# Patient Record
Sex: Male | Born: 1970
Health system: Southern US, Community
[De-identification: ages and names within clinical notes are randomized; demographics above are authoritative.]

## PROBLEM LIST (undated history)

## (undated) DIAGNOSIS — G44019 Episodic cluster headache, not intractable: Secondary | ICD-10-CM

## (undated) DIAGNOSIS — Z9981 Dependence on supplemental oxygen: Secondary | ICD-10-CM

## (undated) DIAGNOSIS — F17200 Nicotine dependence, unspecified, uncomplicated: Secondary | ICD-10-CM

## (undated) DIAGNOSIS — B019 Varicella without complication: Secondary | ICD-10-CM

## (undated) DIAGNOSIS — I499 Cardiac arrhythmia, unspecified: Secondary | ICD-10-CM

## (undated) DIAGNOSIS — T7840XA Allergy, unspecified, initial encounter: Secondary | ICD-10-CM

## (undated) DIAGNOSIS — M5410 Radiculopathy, site unspecified: Secondary | ICD-10-CM

## (undated) DIAGNOSIS — M1990 Unspecified osteoarthritis, unspecified site: Secondary | ICD-10-CM

## (undated) DIAGNOSIS — Z0000 Encounter for general adult medical examination without abnormal findings: Secondary | ICD-10-CM

## (undated) DIAGNOSIS — E663 Overweight: Secondary | ICD-10-CM

## (undated) DIAGNOSIS — Z87891 Personal history of nicotine dependence: Secondary | ICD-10-CM

## (undated) DIAGNOSIS — J339 Nasal polyp, unspecified: Secondary | ICD-10-CM

## (undated) DIAGNOSIS — E785 Hyperlipidemia, unspecified: Secondary | ICD-10-CM

## (undated) DIAGNOSIS — S4992XA Unspecified injury of left shoulder and upper arm, initial encounter: Secondary | ICD-10-CM

## (undated) DIAGNOSIS — R0902 Hypoxemia: Secondary | ICD-10-CM

## (undated) DIAGNOSIS — F419 Anxiety disorder, unspecified: Secondary | ICD-10-CM

## (undated) HISTORY — DX: Overweight: E66.3

## (undated) HISTORY — DX: Episodic cluster headache, not intractable: G44.019

## (undated) HISTORY — DX: Dependence on supplemental oxygen: Z99.81

## (undated) HISTORY — PX: APPENDECTOMY: SHX54

## (undated) HISTORY — DX: Unspecified injury of left shoulder and upper arm, initial encounter: S49.92XA

## (undated) HISTORY — DX: Hypoxemia: R09.02

## (undated) HISTORY — DX: Varicella without complication: B01.9

## (undated) HISTORY — PX: NASAL SINUS SURGERY: SHX719

## (undated) HISTORY — DX: Allergy, unspecified, initial encounter: T78.40XA

## (undated) HISTORY — DX: Nasal polyp, unspecified: J33.9

## (undated) HISTORY — DX: Encounter for general adult medical examination without abnormal findings: Z00.00

## (undated) HISTORY — DX: Hyperlipidemia, unspecified: E78.5

## (undated) HISTORY — DX: Nicotine dependence, unspecified, uncomplicated: F17.200

## (undated) HISTORY — DX: Personal history of nicotine dependence: Z87.891

## (undated) HISTORY — DX: Radiculopathy, site unspecified: M54.10

---

## 2010-02-18 ENCOUNTER — Emergency Department (HOSPITAL_BASED_OUTPATIENT_CLINIC_OR_DEPARTMENT_OTHER): Admission: EM | Admit: 2010-02-18 | Discharge: 2010-02-18 | Payer: Self-pay | Admitting: Emergency Medicine

## 2010-07-04 HISTORY — PX: BICEPS TENDON REPAIR: SHX566

## 2011-04-13 ENCOUNTER — Other Ambulatory Visit: Payer: Self-pay | Admitting: Family Medicine

## 2011-04-13 DIAGNOSIS — M25569 Pain in unspecified knee: Secondary | ICD-10-CM

## 2011-04-13 DIAGNOSIS — T1490XA Injury, unspecified, initial encounter: Secondary | ICD-10-CM

## 2011-04-15 ENCOUNTER — Ambulatory Visit
Admission: RE | Admit: 2011-04-15 | Discharge: 2011-04-15 | Disposition: A | Discharge status: Home / Self Care | Payer: BC Managed Care – PPO | Source: Ambulatory Visit | Attending: Family Medicine | Admitting: Family Medicine

## 2011-04-15 DIAGNOSIS — T1490XA Injury, unspecified, initial encounter: Secondary | ICD-10-CM

## 2011-04-15 DIAGNOSIS — M25569 Pain in unspecified knee: Secondary | ICD-10-CM

## 2011-04-29 ENCOUNTER — Ambulatory Visit (HOSPITAL_BASED_OUTPATIENT_CLINIC_OR_DEPARTMENT_OTHER)
Admission: RE | Admit: 2011-04-29 | Discharge: 2011-04-29 | Disposition: A | Discharge status: Home / Self Care | Payer: BC Managed Care – PPO | Source: Ambulatory Visit | Attending: Orthopedic Surgery | Admitting: Orthopedic Surgery

## 2011-04-29 DIAGNOSIS — M2350 Chronic instability of knee, unspecified knee: Secondary | ICD-10-CM | POA: Insufficient documentation

## 2011-04-29 DIAGNOSIS — M23305 Other meniscus derangements, unspecified medial meniscus, unspecified knee: Secondary | ICD-10-CM | POA: Insufficient documentation

## 2011-04-29 DIAGNOSIS — M23302 Other meniscus derangements, unspecified lateral meniscus, unspecified knee: Secondary | ICD-10-CM | POA: Insufficient documentation

## 2011-04-29 DIAGNOSIS — M2340 Loose body in knee, unspecified knee: Secondary | ICD-10-CM | POA: Insufficient documentation

## 2011-05-05 LAB — POCT HEMOGLOBIN-HEMACUE: Hemoglobin: 16.4 g/dL (ref 13.0–17.0)

## 2011-07-05 HISTORY — PX: CARPAL TUNNEL RELEASE: SHX101

## 2011-07-05 HISTORY — PX: KNEE SURGERY: SHX244

## 2011-07-29 ENCOUNTER — Ambulatory Visit (INDEPENDENT_AMBULATORY_CARE_PROVIDER_SITE_OTHER): Payer: BC Managed Care – PPO | Admitting: Family Medicine

## 2011-07-29 ENCOUNTER — Encounter: Payer: Self-pay | Admitting: Family Medicine

## 2011-07-29 VITALS — BP 118/87 | HR 66 | Temp 97.4°F | Ht 69.5 in | Wt 213.8 lb

## 2011-07-29 DIAGNOSIS — G44019 Episodic cluster headache, not intractable: Secondary | ICD-10-CM

## 2011-07-29 DIAGNOSIS — G44009 Cluster headache syndrome, unspecified, not intractable: Secondary | ICD-10-CM

## 2011-07-29 DIAGNOSIS — F17200 Nicotine dependence, unspecified, uncomplicated: Secondary | ICD-10-CM

## 2011-07-29 MED ORDER — KETOROLAC TROMETHAMINE 30 MG/ML IJ SOLN: 30.0000 mg | Freq: Once | INTRAMUSCULAR | Status: DC

## 2011-07-29 MED ORDER — KETOROLAC TROMETHAMINE 60 MG/2ML IM SOLN
30.0000 mg | Freq: Once | INTRAMUSCULAR | Status: AC
  Administered 2011-07-29: 30 mg via INTRAMUSCULAR

## 2011-07-29 MED ORDER — PROMETHAZINE HCL 25 MG PO TABS: 25.0000 mg | ORAL_TABLET | Freq: Four times a day (QID) | ORAL | Status: AC | PRN

## 2011-07-29 MED ORDER — BUTALBITAL-APAP-CAFFEINE 50-300-40 MG PO CAPS: 1.0000 | ORAL_CAPSULE | Freq: Three times a day (TID) | ORAL | Status: DC | PRN

## 2011-07-29 NOTE — Patient Instructions (Signed)
Migraine Headache A migraine headache is an intense, throbbing pain on one or both sides of your head. The exact cause of a migraine headache is not always known. A migraine may be caused when nerves in the brain become irritated and release chemicals that cause swelling within blood vessels, causing pain. Many migraine sufferers have a family history of migraines. Before you get a migraine you may or may not get an aura. An aura is a group of symptoms that can predict the beginning of a migraine. An aura may include:  Visual changes such as:   Flashing lights.   Bright spots or zig-zag lines.   Tunnel vision.   Feelings of numbness.   Trouble talking.   Muscle weakness.  SYMPTOMS  Pain on one or both sides of your head.   Pain that is pulsating or throbbing in nature.   Pain that is severe enough to prevent daily activities.   Pain that is aggravated by any daily physical activity.   Nausea (feeling sick to your stomach), vomiting, or both.   Pain with exposure to bright lights, loud noises, or activity.   General sensitivity to bright lights or loud noises.  MIGRAINE TRIGGERS Examples of triggers of migraine headaches include:   Alcohol.   Smoking.   Stress.   It may be related to menses (male menstruation).   Aged cheeses.   Foods or drinks that contain nitrates, glutamate, aspartame, or tyramine.   Lack of sleep.   Chocolate.   Caffeine.   Hunger.   Medications such as nitroglycerine (used to treat chest pain), birth control pills, estrogen, and some blood pressure medications.  DIAGNOSIS  A migraine headache is often diagnosed based on:  Symptoms.   Physical examination.   A computerized X-ray scan (computed tomography, CT) of your head.  TREATMENT  Medications can help prevent migraines if they are recurrent or should they become recurrent. Your caregiver can help you with a medication or treatment program that will be helpful to you.   Lying  down in a dark, quiet room may be helpful.   Keeping a headache diary may help you find a trend as to what may be triggering your headaches.  SEEK IMMEDIATE MEDICAL CARE IF:   You have confusion, personality changes or seizures.   You have headaches that wake you from sleep.   You have an increased frequency in your headaches.   You have a stiff neck.   You have a loss of vision.   You have muscle weakness.   You start losing your balance or have trouble walking.   You feel faint or pass out.  MAKE SURE YOU:   Understand these instructions.   Will watch your condition.   Will get help right away if you are not doing well or get worse.  Document Released: 06/20/2005 Document Revised: 03/02/2011 Document Reviewed: 02/03/2009 [facility] Patient Information 2012 [place], [place].  Start Zyrtec daily and saline nasal spray twice daily and increase Zyrtec to twice a day and report if HA persists

## 2011-07-31 ENCOUNTER — Encounter: Payer: Self-pay | Admitting: Family Medicine

## 2011-07-31 DIAGNOSIS — Z87891 Personal history of nicotine dependence: Secondary | ICD-10-CM | POA: Insufficient documentation

## 2011-07-31 DIAGNOSIS — E669 Obesity, unspecified: Secondary | ICD-10-CM | POA: Insufficient documentation

## 2011-07-31 DIAGNOSIS — F17200 Nicotine dependence, unspecified, uncomplicated: Secondary | ICD-10-CM

## 2011-07-31 DIAGNOSIS — G44019 Episodic cluster headache, not intractable: Secondary | ICD-10-CM | POA: Insufficient documentation

## 2011-07-31 DIAGNOSIS — E349 Endocrine disorder, unspecified: Secondary | ICD-10-CM | POA: Insufficient documentation

## 2011-07-31 HISTORY — DX: Nicotine dependence, unspecified, uncomplicated: F17.200

## 2011-07-31 HISTORY — DX: Personal history of nicotine dependence: Z87.891

## 2011-07-31 NOTE — Progress Notes (Signed)
Patient ID: Anthony Reese, male   DOB: 1971/05/11, 41 y.o.   MRN: [id number] Makar Slatter [id number] 1970-12-17 07/31/2011      Progress Note New Patient  Subjective  Chief Complaint  Chief Complaint  Patient presents with  . Establish Care    headache    HPI  For urgent nutrition appointment. He has been struggling with a severe headache resulting in firm mobile, photophobia, nausea and great discomfort since this morning. No trauma. Does have a history of similar headaches. Didn't come in batches of several and then resolve. Always located over the left thigh. Tried some Vicodin he had left over from her recent knee surgery which she feels it worse. He tried to Excedrin Migraine which did not help. Denies the recent illness, fevers, chills, sore throat or congestion. No chest pain, palpitations, shortness of breath  Past Medical History  Diagnosis Date  . Headache, cluster, episodic   . Low testosterone   . Overweight   . Tobacco use disorder 07/31/2011    Past Surgical History  Procedure Date  . Knee surgery     Family History  Problem Relation Age of Onset  . Cancer Mother     leukemia, pancreatic cancer  . Cancer Maternal Aunt     lung  . Cancer Maternal Grandfather     colon     History   Social History  . Marital Status: Married    Spouse Name: N/A    Number of Children: N/A  . Years of Education: N/A   Occupational History  . Not on file.   Social History Main Topics  . Smoking status: Current Some Day Smoker    Types: Cigarettes  . Smokeless tobacco: Never Used  . Alcohol Use: Yes  . Drug Use: No  . Sexually Active: Yes -- Male partner(s)   Other Topics Concern  . Not on file   Social History Narrative  . No narrative on file    No current outpatient prescriptions on file prior to visit.    Allergies  Allergen Reactions  . Penicillins     Review of Systems  Review of Systems  Constitutional: Negative for fever, chills and  malaise/fatigue.  HENT: Negative for hearing loss, nosebleeds and congestion.   Eyes: Positive for photophobia. Negative for discharge.  Respiratory: Negative for cough, sputum production, shortness of breath and wheezing.   Cardiovascular: Negative for chest pain, palpitations and leg swelling.  Gastrointestinal: Negative for heartburn, nausea, vomiting, abdominal pain, diarrhea, constipation and blood in stool.  Genitourinary: Negative for dysuria, urgency, frequency and hematuria.  Musculoskeletal: Negative for myalgias, back pain and falls.  Skin: Negative for rash.  Neurological: Positive for headaches. Negative for dizziness, tremors, sensory change, focal weakness, loss of consciousness and weakness.  Endo/Heme/Allergies: Negative for polydipsia. Does not bruise/bleed easily.  Psychiatric/Behavioral: Negative for depression and suicidal ideas. The patient is not nervous/anxious and does not have insomnia.     Objective  BP 118/87  Pulse 66  Temp(Src) 97.4 F (36.3 C) (Temporal)  Ht 5' 9.5" (1.765 m)  Wt 213 lb 12 oz (96.956 kg)  BMI 31.11 kg/m2  SpO2 98%  Physical Exam  Physical Exam  Constitutional: He is oriented to person, place, and time. He appears distressed.       WNWD W male in mild distress  HENT:  Head: Normocephalic and atraumatic.  Eyes: Conjunctivae are normal.  Neck: Neck supple. No thyromegaly present.  Cardiovascular: Normal rate, regular rhythm and normal heart sounds.  No murmur heard. Pulmonary/Chest: Effort normal and breath sounds normal. No respiratory distress.  Abdominal: He exhibits no distension and no mass. There is no tenderness.  Musculoskeletal: He exhibits no edema.  Neurological: He is alert and oriented to person, place, and time. He has normal reflexes. He displays normal reflexes. No cranial nerve deficit. Gait normal. Coordination normal. GCS score is 15.  Skin: Skin is warm.  Psychiatric: Memory, affect and judgment normal.        Assessment & Plan  Headache, cluster, episodic Always over the left eye and come in batches then clear for awhile, has photophobia and phonophobia with nausea at times. Given a shot of Toradol in the office and sent home to rest, hydrate and take a dose of Promethazine. Given an rx for Fioricet to try and return or seek care if no improvement.   Tobacco use disorder Only smoking 2 cigarettes per day but encouraged to attempt complete cessation.

## 2011-07-31 NOTE — Assessment & Plan Note (Signed)
Only smoking 2 cigarettes per day but encouraged to attempt complete cessation.

## 2011-07-31 NOTE — Assessment & Plan Note (Signed)
Always over the left eye and come in batches then clear for awhile, has photophobia and phonophobia with nausea at times. Given a shot of Toradol in the office and sent home to rest, hydrate and take a dose of Promethazine. Given an rx for Fioricet to try and return or seek care if no improvement.

## 2011-10-25 ENCOUNTER — Other Ambulatory Visit: Payer: Self-pay | Admitting: Family Medicine

## 2011-10-25 MED ORDER — FLUTICASONE PROPIONATE 50 MCG/ACT NA SUSP: 2.0000 | Freq: Every day | NASAL | Status: DC

## 2011-10-25 NOTE — Progress Notes (Signed)
Patient called with worsening congestion and allergies. Will send in Flonase to use 2 sprays each nostril daily. Can take his OTC nonsedating antihistamine up to twice during the day and may use Benadryl at bed as needed

## 2011-10-31 ENCOUNTER — Other Ambulatory Visit: Payer: Self-pay | Admitting: Family Medicine

## 2011-10-31 DIAGNOSIS — T7840XA Allergy, unspecified, initial encounter: Secondary | ICD-10-CM

## 2011-10-31 MED ORDER — METHYLPREDNISOLONE 4 MG PO KIT: PACK | ORAL | Status: AC

## 2011-11-14 ENCOUNTER — Ambulatory Visit (INDEPENDENT_AMBULATORY_CARE_PROVIDER_SITE_OTHER): Payer: BC Managed Care – PPO | Admitting: Family Medicine

## 2011-11-14 ENCOUNTER — Encounter: Payer: Self-pay | Admitting: Family Medicine

## 2011-11-14 VITALS — BP 117/77 | HR 68 | Temp 97.8°F | Wt 212.5 lb

## 2011-11-14 DIAGNOSIS — G5601 Carpal tunnel syndrome, right upper limb: Secondary | ICD-10-CM

## 2011-11-14 DIAGNOSIS — M79601 Pain in right arm: Secondary | ICD-10-CM

## 2011-11-14 DIAGNOSIS — M79609 Pain in unspecified limb: Secondary | ICD-10-CM

## 2011-11-14 DIAGNOSIS — M5412 Radiculopathy, cervical region: Secondary | ICD-10-CM

## 2011-11-14 DIAGNOSIS — G5600 Carpal tunnel syndrome, unspecified upper limb: Secondary | ICD-10-CM

## 2011-11-14 DIAGNOSIS — M5410 Radiculopathy, site unspecified: Secondary | ICD-10-CM

## 2011-11-14 MED ORDER — METHYLPREDNISOLONE 4 MG PO KIT: PACK | ORAL | Status: DC

## 2011-11-14 NOTE — Patient Instructions (Signed)
Ulnar Nerve Contusion with Rehab The ulnar nerve lies near the surface of the skin as it passes by the elbow. This location causes it to be susceptible to injury. An ulnar nerve contusion is a bruise of the nerve. It is the result of direct trauma to the elbow. Ulnar nerve contusions are characterized by pain, weakness, and loss of feeling in the hand. SYMPTOMS   Signs of nerve damage include: tingling, numbness, weakness, and/or loss of feeling in the hand, specifically the little finger and ring finger.   Sharp pains that may shoot from the elbow to the wrist and hand.   Decreased hand function.   Tenderness and/ or inflammation in the elbow.   Muscle wasting (atrophy) in the hand.  CAUSES  Ulnar nerve contusions are caused by direct trauma to the elbow that results in bleeding which enters the nerve. RISK INCREASES WITH:  Contact sports (football, soccer, or rugby).   Bleeding disorders.   Taking blood thinning medicine (warfarin [Coumadin], aspirin, or nonsteroidal anti-inflammatory medications).   Diabetes mellitus.   Underactive thyroid gland (hypothyroidism).  PREVENTION  Wear properly fitted and padded protective equipment.   Only take blood thinning medication when necessary.  PROGNOSIS  Ulnar nerve contusions usually heal within 6 weeks. Healing often occurs spontaneously, but treatment helps reduce symptoms.  RELATED COMPLICATIONS   Permanent nerve damage, including pain, numbness, tingling, or weakness in the hand (rare).   Weak grip.   Prolonged healing time, if improperly treated or re-injured.  TREATMENT  Treatment initially involves resting from any activities that aggravate the symptoms, and the use of ice and medications to help reduce pain and inflammation. The use of strengthening and stretching exercises may help reduce pain with activity. These exercises may be performed at home or with referral to a therapist. Your caregiver may recommend that you  splint the elbow at night to help healing of the nerve. If symptoms persist despite conservative (non-surgical) treatment, then surgery may be recommended to free the nerve. MEDICATION   If pain medication is necessary, then nonsteroidal anti-inflammatory medications, such as aspirin and ibuprofen, or other minor pain relievers, such as acetaminophen, are often recommended.   Do not take pain medication within 7 days before surgery.   Prescription pain relievers may be given if deemed necessary by your caregiver. Use only as directed and only as much as you need.  COLD THERAPY  Cold treatment (icing) relieves pain and reduces inflammation. Cold treatment should be applied for 10 to 15 minutes every 2 to 3 hours for inflammation and pain and immediately after any activity that aggravates your symptoms. Use ice packs or massage the area with a piece of ice (ice massage). SEEK MEDICAL CARE IF:   Treatment seems to offer no benefit, or the condition worsens.   Any medications produce adverse side effects.  EXERCISES RANGE OF MOTION (ROM) AND STRETCHING EXERCISES - Ulnar Nerve Contusion These exercises may help you when beginning to rehabilitate your injury. Do not begin these exercises until your physician, physical therapist or athletic trainer advises you to do so. Discontinue any exercise that worsens your symptoms. Contact your physician with instructions on how to continue. Your symptoms may resolve with or without further involvement from your physician, physical therapist or athletic trainer. While completing these exercises, remember:  Restoring tissue flexibility helps normal motion to return to the joints. This allows healthier, less painful movement and activity.   An effective stretch should be held for at least 30 seconds.  A stretch should never be painful. You should only feel a gentle lengthening or release in the stretched tissue.  RANGE OF MOTION - Extension  Hold your right  / left arm at your side and straighten your elbow as far as you can using your right / left arm muscles.   Straighten the right / left elbow farther by gently pushing down on your forearm until you feel a gentle stretch on the inside of your elbow. Hold this position for __________ seconds.   Slowly return to the starting position.  Repeat __________ times. Complete this exercise __________ times per day.  RANGE OF MOTION - Flexion  Hold your right / left arm at your side and bend your elbow as far as you can using your right / left arm muscles.   Bend the right / left elbow farther by gently pushing up on your forearm until you feel a gentle stretch on the outside of your elbow. Hold this position for __________ seconds.   Slowly return to the starting position.  Repeat __________ times. Complete this exercise __________ times per day.  RANGE OF MOTION - Wrist Flexion, Active-Assisted  Extend your right / left elbow with your fingers pointing down.*   Gently pull the back of your hand towards you until you feel a gentle stretch on the top of your forearm.   Hold this position for __________ seconds.  Repeat __________ times. Complete this exercise __________ times per day.  *If directed by your physician, physical therapist or athletic trainer, complete this stretch with your elbow bent rather than extended. RANGE OF MOTION - Wrist Extension, Active-Assisted  Extend your right / left elbow and turn your palm upwards.*   Gently pull your palm/fingertips back so your wrist extends and your fingers point more toward the ground.   You should feel a gentle stretch on the inside of your forearm.   Hold this position for __________ seconds.  Repeat __________ times. Complete this exercise __________ times per day. *If directed by your physician, physical therapist or athletic trainer, complete this stretch with your elbow bent, rather than extended. RANGE OF MOTION - Supination,  Active  Stand or sit with your elbows at your side. Bend your right / left elbow to 90 degrees.   Turn your palm upward until you feel a gentle stretch on the inside of your forearm.   Hold this position for __________ seconds. Slowly release and return to the starting position.  Repeat __________ times. Complete this stretch __________ times per day.  RANGE OF MOTION - Pronation, Active  Stand or sit with your elbows at your side. Bend your right / left elbow to 90 degrees.   Turn your palm downward until you feel a gentle stretch on the top of your forearm.   Hold this position for __________ seconds. Slowly release and return to the starting position.  Repeat __________ times. Complete this stretch __________ times per day.  STRETCH - Wrist Flexion   Place the back of your right / left hand on a tabletop leaving your elbow slightly bent. Your fingers should point away from your body.   Gently press the back of your hand down onto the table by straightening your elbow. You should feel a stretch on the top of your forearm.   Hold this position for __________ seconds.  Repeat __________ times. Complete this stretch __________ times per day.  STRETCH - Wrist Extension   Place your right / left fingertips on a  tabletop leaving your elbow slightly bent. Your fingers should point backwards.   Gently press your fingers and palm down onto the table by straightening your elbow. You should feel a stretch on the inside of your forearm.   Hold this position for __________ seconds.  Repeat __________ times. Complete this stretch __________ times per day.  STRENGTHENING EXERCISES - Ulnar Nerve Contusion These exercises may help you when beginning to rehabilitate your injury. Do not begin these exercises until your physician, physical therapist or athletic trainer advises you to do so. Discontinue any exercise that worsens your symptoms. Contact your physician for instructions on how to continue.  They may resolve your symptoms with or without further involvement from your physician, physical therapist or athletic trainer. While completing these exercises, remember:   Muscles can gain both the endurance and the strength needed for everyday activities through controlled exercises.   Complete these exercises as instructed by your physician, physical therapist or athletic trainer. Progress with the resistance and repetition exercises only as your caregiver advises.  STRENGTH - Wrist Flexors  Sit with your right / left forearm palm-up and fully supported. Your elbow should be resting below the height of your shoulder. Allow your wrist to extend over the edge of the surface.   Loosely holding a __________ weight or a piece of rubber exercise band/tubing, slowly curl your hand up toward your forearm.   Hold this position for __________ seconds. Slowly lower the wrist back to the starting position in a controlled manner.  Repeat __________ times. Complete this exercise __________ times per day.  STRENGTH - Wrist Extensors  Sit with your right / left forearm palm-down and fully supported. Your elbow should be resting below the height of your shoulder. Allow your wrist to extend over the edge of the surface.   Loosely holding a __________ weight or a piece of rubber exercise band/tubing, slowly curl your hand up toward your forearm.   Hold this position for __________ seconds. Slowly lower the wrist back to the starting position in a controlled manner.  Repeat __________ times. Complete this exercise __________ times per day.  STRENGTH - Ulnar Deviators  Stand with a ____________________ weight in your right / left hand or sit holding on to the rubber exercise band/tubing with your opposite arm supported.   Move your wrist so that your pinkie travels toward your forearm and your thumb moves away from your forearm.   Hold this position for __________ seconds and then slowly lower the wrist  back to the starting position.  Repeat __________ times. Complete this exercise __________ times per day STRENGTH - Radial Deviators  Stand with a ____________________ weight in your right / left hand or sit holding on to the rubber exercise band/tubing with your arm supported.   Raise your hand upward in front of you or pull up on the rubber tubing.   Hold this position for __________ seconds and then slowly lower the wrist back to the starting position.  Repeat __________ times. Complete this exercise __________ times per day. STRENGTH - Grip  Grasp a tennis ball, a dense sponge, or a large, rolled sock in your hand.   Squeeze as hard as you can without increasing any pain.   Hold this position for __________ seconds. Release your grip slowly.  Repeat __________ times. Complete this exercise __________ times per day.  Document Released: 06/20/2005 Document Revised: 06/09/2011 Document Reviewed: 10/02/2008 [facility] Patient Information 2012 [place], [place].Carpal Tunnel Syndrome You may have carpal tunnel  syndrome. This is a common condition. Carpal tunnel syndrome occurs when the tendons, bones, or ligaments in the wrist press against the median nerve as it passes into the hand.  Symptoms can include:  Intermittent numbness.   Pain or a tingling sensation in thumb and first two fingers.  The pain may radiate up to the shoulder. There may even be weakness in the hand muscles. The pain is often worse at night and in the early morning. Nerve conduction tests may be used to prove the diagnosis. Carpal tunnel syndrome is most often due to repeated movements of the hand or wrist. Other causes can include:  Prior injuries.   Diabetes.   Obesity.   Smoking.   Pregnancy. Symptoms that develop during pregnancy often stop when the pregnancy is over.  Treatment includes:  Splinting - A wrist splint helps prevent movements that irritate the nerve. Splints are especially helpful at night  when the symptoms are often worse.   Ice packs - Cold packs applied to the palm side of the wrist for 20 minutes every 2 hours while awake may give some relief.   Medication - Medicine to reduce inflammation and pain are often used. Cortisone injections around the nerve may also bring improvement.  Severe cases of carpal tunnel syndrome can require surgery to relieve the pressure on the nerve. This may be necessary if there is evidence of weakness or decreased sensation in your hand, or if your symptoms do not improve with conservative treatment. See your caregiver for follow-up to be certain your condition is improving. Document Released: 07/28/2004 Document Revised: 03/02/2011 Document Reviewed: 04/26/2007 [facility] Patient Information 2012 [place], [place].

## 2011-11-20 ENCOUNTER — Encounter: Payer: Self-pay | Admitting: Family Medicine

## 2011-11-20 DIAGNOSIS — M5410 Radiculopathy, site unspecified: Secondary | ICD-10-CM | POA: Insufficient documentation

## 2011-11-20 HISTORY — DX: Radiculopathy, site unspecified: M54.10

## 2011-11-20 NOTE — Progress Notes (Signed)
Patient ID: Anthony Reese, male   DOB: 03-27-1971, 41 y.o.   MRN: [id number] Anthony Reese [id number] 1970/09/21 11/20/2011      Progress Note-Follow Up  Subjective  Chief Complaint  No chief complaint on file.   HPI  Patient is a 41 year old male who is in today complaining of worsening pain and disability in his hands. He has had trouble with intermittent numbness tingling and discomfort in his arms in the past but this has become more persistent. Right arm is worst on left. There is pain radiating from his right elbow down to all of his fingertips and even up his arm slightly. He describes a numbness and tingling with increased use. He also some numbness and tingling in his fingertips in the left hand as well but these are less intense. He's also noted some numbness and discomfort at the back of his right heel for about the last month and denies any injury or other recent complaints. Does note when he was recently on a course of steroids his discomfort did improve temporarily. No complaints of neck pain or recent injury. No cp/palp/sob  Past Medical History  Diagnosis Date  . Headache, cluster, episodic   . Low testosterone   . Overweight   . Tobacco use disorder 07/31/2011  . Radiculopathy affecting upper extremity 11/20/2011    Past Surgical History  Procedure Date  . Knee surgery     Family History  Problem Relation Age of Onset  . Cancer Mother     leukemia, pancreatic cancer  . Cancer Maternal Aunt     lung  . Cancer Maternal Grandfather     colon     History   Social History  . Marital Status: Married    Spouse Name: N/A    Number of Children: N/A  . Years of Education: N/A   Occupational History  . Not on file.   Social History Main Topics  . Smoking status: Current Some Day Smoker    Types: Cigarettes  . Smokeless tobacco: Never Used  . Alcohol Use: Yes  . Drug Use: No  . Sexually Active: Yes -- Male partner(s)   Other Topics Concern  . Not on file     Social History Narrative  . No narrative on file    Current Outpatient Prescriptions on File Prior to Visit  Medication Sig Dispense Refill  . Butalbital-APAP-Caffeine (ORBIVAN) 50-300-40 MG CAPS Take 1 capsule by mouth 3 (three) times daily as needed.  60 capsule  1  . fluticasone (FLONASE) 50 MCG/ACT nasal spray Place 2 sprays into the nose daily. As needed for allergies  16 g  6  . TESTOSTERONE CYPIONATE IM Inject into the muscle.        Allergies  Allergen Reactions  . Penicillins     Review of Systems  Review of Systems  Constitutional: Negative for fever and malaise/fatigue.  HENT: Negative for congestion.   Eyes: Negative for discharge.  Respiratory: Negative for shortness of breath.   Cardiovascular: Negative for chest pain, palpitations and leg swelling.  Gastrointestinal: Negative for nausea, abdominal pain and diarrhea.  Genitourinary: Negative for dysuria.  Musculoskeletal: Positive for joint pain. Negative for falls.  Skin: Negative for rash.  Neurological: Positive for tingling and sensory change. Negative for loss of consciousness and headaches.  Endo/Heme/Allergies: Negative for polydipsia.  Psychiatric/Behavioral: Negative for depression and suicidal ideas. The patient is not nervous/anxious and does not have insomnia.     Objective  BP 117/77  Pulse 68  Temp 97.8 F (36.6 C)  Wt 212 lb 8 oz (96.389 kg)  SpO2 96%  Physical Exam  Physical Exam  Constitutional: He is oriented to person, place, and time and well-developed, well-nourished, and in no distress. No distress.  HENT:  Head: Normocephalic and atraumatic.  Eyes: Conjunctivae are normal.  Neck: Neck supple. No thyromegaly present.  Cardiovascular: Normal rate, regular rhythm and normal heart sounds.   No murmur heard. Pulmonary/Chest: Effort normal and breath sounds normal. No respiratory distress.  Abdominal: He exhibits no distension and no mass. There is no tenderness.   Musculoskeletal: He exhibits no edema.  Neurological: He is alert and oriented to person, place, and time. He displays normal reflexes. No cranial nerve deficit. He exhibits normal muscle tone. Gait normal.       Pain radiates down right arm from elbow with palpation of medial epicondyle. Positive Tinnel's b/l.  Skin: Skin is warm.  Psychiatric: Memory, affect and judgment normal.    No results found for this basename: TSH   Lab Results  Component Value Date   HGB 16.4 04/29/2011      Assessment & Plan  Radiculopathy affecting upper extremity Symptoms worse in right arm but present to some degree in both. Pain in right arm is localized around elbow and numbness, tingling and discomfort radiate down to right hand and in to all 5 fingers. Some discomfort radiates up toward upper arm as well but this is much lesser degree. Numbness and tingling in left hand fingers also noted but less constant or intense. Is referred to ortho for further work up of CTS and possible Ulnar nerve involvement. Given a short course of steroids to use for next five days and he should apply Aspercreme and ice bid as well. Encouraged to immobilize wrist qhs

## 2011-11-20 NOTE — Assessment & Plan Note (Signed)
Symptoms worse in right arm but present to some degree in both. Pain in right arm is localized around elbow and numbness, tingling and discomfort radiate down to right hand and in to all 5 fingers. Some discomfort radiates up toward upper arm as well but this is much lesser degree. Numbness and tingling in left hand fingers also noted but less constant or intense. Is referred to ortho for further work up of CTS and possible Ulnar nerve involvement. Given a short course of steroids to use for next five days and he should apply Aspercreme and ice bid as well. Encouraged to immobilize wrist qhs

## 2012-02-06 ENCOUNTER — Other Ambulatory Visit: Payer: Self-pay | Admitting: Family Medicine

## 2012-02-06 DIAGNOSIS — Z0000 Encounter for general adult medical examination without abnormal findings: Secondary | ICD-10-CM

## 2012-02-06 DIAGNOSIS — E349 Endocrine disorder, unspecified: Secondary | ICD-10-CM

## 2012-02-06 DIAGNOSIS — E785 Hyperlipidemia, unspecified: Secondary | ICD-10-CM

## 2012-04-13 ENCOUNTER — Other Ambulatory Visit (INDEPENDENT_AMBULATORY_CARE_PROVIDER_SITE_OTHER): Payer: BC Managed Care – PPO

## 2012-04-13 DIAGNOSIS — E349 Endocrine disorder, unspecified: Secondary | ICD-10-CM

## 2012-04-13 DIAGNOSIS — Z0000 Encounter for general adult medical examination without abnormal findings: Secondary | ICD-10-CM

## 2012-04-13 DIAGNOSIS — E785 Hyperlipidemia, unspecified: Secondary | ICD-10-CM

## 2012-04-13 DIAGNOSIS — E291 Testicular hypofunction: Secondary | ICD-10-CM

## 2012-04-13 LAB — CBC
RDW: 13 % (ref 11.5–14.6)
WBC: 4.6 10*3/uL (ref 4.5–10.5)

## 2012-04-13 LAB — RENAL FUNCTION PANEL
Creatinine, Ser: 0.9 mg/dL (ref 0.4–1.5)
GFR: 101.58 mL/min (ref >60.00)
Glucose, Bld: 93 mg/dL (ref 70–99)
Phosphorus: 2.7 mg/dL (ref 2.3–4.6)
Sodium: 142 mEq/L (ref 135–145)

## 2012-04-13 LAB — LIPID PANEL
HDL: 54.7 mg/dL (ref >39.00)
Total CHOL/HDL Ratio: 5
Triglycerides: 103 mg/dL (ref 0.0–149.0)

## 2012-04-13 LAB — HEPATIC FUNCTION PANEL
Albumin: 4.3 g/dL (ref 3.5–5.2)
Alkaline Phosphatase: 43 U/L (ref 39–117)
Bilirubin, Direct: 0 mg/dL (ref 0.0–0.3)

## 2012-04-13 LAB — TSH: TSH: 1.16 u[IU]/mL (ref 0.35–5.50)

## 2012-04-16 ENCOUNTER — Other Ambulatory Visit: Payer: Self-pay | Admitting: Family Medicine

## 2012-04-16 DIAGNOSIS — E785 Hyperlipidemia, unspecified: Secondary | ICD-10-CM

## 2012-04-16 DIAGNOSIS — R7989 Other specified abnormal findings of blood chemistry: Secondary | ICD-10-CM

## 2012-04-16 MED ORDER — TESTOSTERONE CYPIONATE 200 MG/ML IM SOLN: 200.0000 mg | INTRAMUSCULAR | Status: DC

## 2012-04-16 MED ORDER — KRILL OIL PO CAPS: ORAL_CAPSULE | ORAL | Status: DC

## 2012-06-19 ENCOUNTER — Telehealth: Payer: Self-pay | Admitting: *Deleted

## 2012-06-19 NOTE — Telephone Encounter (Signed)
Pt called and requested a copy of his last labs.  These were printed and given to wife.

## 2012-07-26 ENCOUNTER — Other Ambulatory Visit: Payer: Self-pay | Admitting: Family Medicine

## 2012-07-26 DIAGNOSIS — M545 Low back pain: Secondary | ICD-10-CM

## 2012-07-26 DIAGNOSIS — G5601 Carpal tunnel syndrome, right upper limb: Secondary | ICD-10-CM

## 2012-07-26 DIAGNOSIS — M79601 Pain in right arm: Secondary | ICD-10-CM

## 2012-07-26 MED ORDER — CYCLOBENZAPRINE HCL 10 MG PO TABS: 10.0000 mg | ORAL_TABLET | Freq: Two times a day (BID) | ORAL | Status: DC | PRN

## 2012-07-26 MED ORDER — METHYLPREDNISOLONE 4 MG PO KIT: PACK | ORAL | Status: AC

## 2012-08-29 ENCOUNTER — Telehealth: Payer: Self-pay | Admitting: Family Medicine

## 2012-08-29 ENCOUNTER — Other Ambulatory Visit (INDEPENDENT_AMBULATORY_CARE_PROVIDER_SITE_OTHER): Payer: BC Managed Care – PPO

## 2012-08-29 DIAGNOSIS — R300 Dysuria: Secondary | ICD-10-CM

## 2012-08-29 LAB — POCT URINALYSIS DIPSTICK
Bilirubin, UA: NEGATIVE
Glucose, UA: NEGATIVE
Ketones, UA: NEGATIVE
pH, UA: 7

## 2012-08-29 MED ORDER — CIPROFLOXACIN HCL 500 MG PO TABS: 500.0000 mg | ORAL_TABLET | Freq: Two times a day (BID) | ORAL | Status: DC

## 2012-08-29 NOTE — Telephone Encounter (Signed)
Given paper copy of Ciprofloxacin to use as needed if symptoms worsen or urine culture positive

## 2012-08-29 NOTE — Progress Notes (Unsigned)
Labs only

## 2012-08-31 LAB — URINE CULTURE: Organism ID, Bacteria: NO GROWTH

## 2012-10-22 ENCOUNTER — Telehealth: Payer: Self-pay | Admitting: Family Medicine

## 2012-10-22 DIAGNOSIS — R7989 Other specified abnormal findings of blood chemistry: Secondary | ICD-10-CM

## 2012-10-22 MED ORDER — TESTOSTERONE CYPIONATE 200 MG/ML IM SOLN: 200.0000 mg | INTRAMUSCULAR | Status: DC

## 2012-10-22 NOTE — Telephone Encounter (Signed)
OK to refill Testosterone with same sig and disp #1 bottle, no further refills

## 2012-10-22 NOTE — Telephone Encounter (Signed)
Faxed refill to pharmacy

## 2012-10-22 NOTE — Telephone Encounter (Signed)
Refill testosterone cup 200 mg  Days supply 30

## 2012-10-29 ENCOUNTER — Other Ambulatory Visit: Payer: Self-pay

## 2012-10-29 MED ORDER — FLUTICASONE PROPIONATE 50 MCG/ACT NA SUSP
2.0000 | Freq: Every day | NASAL | Status: DC
Start: 2012-10-29 — End: 2013-11-18

## 2012-10-29 NOTE — Telephone Encounter (Signed)
Sent refill on Flonase to pharmacy

## 2012-12-17 ENCOUNTER — Telehealth: Payer: Self-pay | Admitting: Family Medicine

## 2012-12-17 NOTE — Telephone Encounter (Signed)
Patient had swelling around eyes and felt SOB after second dose of Ativan. Will list as allergy

## 2012-12-19 ENCOUNTER — Other Ambulatory Visit: Payer: Self-pay | Admitting: Family Medicine

## 2012-12-19 ENCOUNTER — Telehealth: Payer: Self-pay | Admitting: *Deleted

## 2012-12-19 DIAGNOSIS — E349 Endocrine disorder, unspecified: Secondary | ICD-10-CM

## 2012-12-19 NOTE — Telephone Encounter (Signed)
Needing order for testosterone...Raechel Chute

## 2012-12-20 LAB — TESTOSTERONE: Testosterone: 231 ng/dL — ABNORMAL LOW (ref 300–890)

## 2013-01-30 ENCOUNTER — Encounter: Payer: Self-pay | Admitting: Family Medicine

## 2013-01-30 ENCOUNTER — Ambulatory Visit (HOSPITAL_BASED_OUTPATIENT_CLINIC_OR_DEPARTMENT_OTHER)
Admission: RE | Admit: 2013-01-30 | Discharge: 2013-01-30 | Disposition: A | Discharge status: Home / Self Care | Payer: BC Managed Care – PPO | Source: Ambulatory Visit | Attending: Family Medicine | Admitting: Family Medicine

## 2013-01-30 ENCOUNTER — Ambulatory Visit (INDEPENDENT_AMBULATORY_CARE_PROVIDER_SITE_OTHER): Payer: BC Managed Care – PPO | Admitting: Family Medicine

## 2013-01-30 VITALS — BP 122/78 | HR 63 | Temp 98.4°F | Ht 69.0 in | Wt 212.2 lb

## 2013-01-30 DIAGNOSIS — M25512 Pain in left shoulder: Secondary | ICD-10-CM

## 2013-01-30 DIAGNOSIS — M25519 Pain in unspecified shoulder: Secondary | ICD-10-CM | POA: Insufficient documentation

## 2013-01-30 DIAGNOSIS — S4980XA Other specified injuries of shoulder and upper arm, unspecified arm, initial encounter: Secondary | ICD-10-CM

## 2013-01-30 DIAGNOSIS — S4992XA Unspecified injury of left shoulder and upper arm, initial encounter: Secondary | ICD-10-CM | POA: Insufficient documentation

## 2013-01-30 HISTORY — DX: Unspecified injury of left shoulder and upper arm, initial encounter: S49.92XA

## 2013-01-30 MED ORDER — METHYLPREDNISOLONE (PAK) 4 MG PO TABS: ORAL_TABLET | ORAL | Status: DC

## 2013-01-30 MED ORDER — METHOCARBAMOL 500 MG PO TABS: 500.0000 mg | ORAL_TABLET | Freq: Three times a day (TID) | ORAL | Status: DC | PRN

## 2013-01-30 NOTE — Patient Instructions (Addendum)
Ice for next 24 hours then apply moist heat as needed after that Can try topical Aspercreme or Salon Pas cream or patches Can restart Advil/Motrin/Ibuprofen 200mg  2 tabs po every 6 hours as needed with food when steroids are ending   Shoulder Pain The shoulder is the joint that connects your arms to your body. The bones that form the shoulder joint include the upper arm bone (humerus), the shoulder blade (scapula), and the collarbone (clavicle). The top of the humerus is shaped like a ball and fits into a rather flat socket on the scapula (glenoid cavity). A combination of muscles and strong, fibrous tissues that connect muscles to bones (tendons) support your shoulder joint and hold the ball in the socket. Small, fluid-filled sacs (bursae) are located in different areas of the joint. They act as cushions between the bones and the overlying soft tissues and help reduce friction between the gliding tendons and the bone as you move your arm. Your shoulder joint allows a wide range of motion in your arm. This range of motion allows you to do things like scratch your back or throw a ball. However, this range of motion also makes your shoulder more prone to pain from overuse and injury. Causes of shoulder pain can originate from both injury and overuse and usually can be grouped in the following four categories:  Redness, swelling, and pain (inflammation) of the tendon (tendinitis) or the bursae (bursitis).  Instability, such as a dislocation of the joint.  Inflammation of the joint (arthritis).  Broken bone (fracture). HOME CARE INSTRUCTIONS   Apply ice to the sore area.  Put ice in a plastic bag.  Place a towel between your skin and the bag.  Leave the ice on for 15-20 minutes, 3-4 times per day for the first 2 days.  Stop using cold packs if they do not help with the pain.  If you have a shoulder sling or immobilizer, wear it as long as your caregiver instructs. Only remove it to shower or  bathe. Move your arm as little as possible, but keep your hand moving to prevent swelling.  Squeeze a soft ball or foam pad as much as possible to help prevent swelling.  Only take over-the-counter or prescription medicines for pain, discomfort, or fever as directed by your caregiver. SEEK MEDICAL CARE IF:   Your shoulder pain increases, or new pain develops in your arm, hand, or fingers.  Your hand or fingers become cold and numb.  Your pain is not relieved with medicines. SEEK IMMEDIATE MEDICAL CARE IF:   Your arm, hand, or fingers are numb or tingling.  Your arm, hand, or fingers are significantly swollen or turn white or blue. MAKE SURE YOU:   Understand these instructions.  Will watch your condition.  Will get help right away if you are not doing well or get worse. Document Released: 03/30/2005 Document Revised: 03/14/2012 Document Reviewed: 06/04/2011 [facility] Patient Information 2014 [place], [place].
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
## 2013-01-31 ENCOUNTER — Ambulatory Visit: Payer: BC Managed Care – PPO | Admitting: Family Medicine

## 2013-04-26 ENCOUNTER — Telehealth: Payer: Self-pay | Admitting: *Deleted

## 2013-04-26 DIAGNOSIS — R7989 Other specified abnormal findings of blood chemistry: Secondary | ICD-10-CM

## 2013-04-26 MED ORDER — TESTOSTERONE CYPIONATE 200 MG/ML IM SOLN: 200.0000 mg | INTRAMUSCULAR | Status: DC

## 2013-04-26 NOTE — Telephone Encounter (Signed)
Rx printed and faxed to pharmacy. 

## 2013-04-26 NOTE — Telephone Encounter (Signed)
OK to refill testosterone with same sig, #1 bottle, no refills til seen

## 2013-04-26 NOTE — Telephone Encounter (Signed)
Pt called requesting refill of testosterone. Pt is due for CPE on 05/04/13 but will not be able to complete it by then due to insurance reasons. Current refills are expiring.  Please advise.

## 2013-09-02 NOTE — Addendum Note (Signed)
Addended by: Kelle Darting A on: 09/02/2013 10:21 AM   Modules accepted: Orders

## 2013-09-02 NOTE — Telephone Encounter (Signed)
Pt will return to the lab this week. Order released.

## 2013-09-04 LAB — TESTOSTERONE: TESTOSTERONE: 1311 ng/dL — AB (ref 300–890)

## 2013-09-05 NOTE — Telephone Encounter (Signed)
Pt informed

## 2013-09-10 ENCOUNTER — Telehealth: Payer: Self-pay | Admitting: *Deleted

## 2013-09-10 DIAGNOSIS — E349 Endocrine disorder, unspecified: Secondary | ICD-10-CM

## 2013-09-10 MED ORDER — TESTOSTERONE CYPIONATE 200 MG/ML IM SOLN: 100.0000 mg | INTRAMUSCULAR | Status: DC

## 2013-09-10 NOTE — Telephone Encounter (Signed)
Pt requesting refill of testosterone.  Refill called to pharmacy voicemail per directions below and lab order entered.  Notes Recorded by Mosie Lukes, MD on 09/04/2013 at 2:21 PM So let him know that his testosterone is now he needs to drop his dose to 1/2 ml every other week. Recheck in 3 months

## 2013-09-10 NOTE — Telephone Encounter (Signed)
Received fax from [facility] that testosterone is requiring prior authorization. Prior auth completed via cover my meds and uploaded electronically. Should receive determination notice within 3 days.

## 2013-09-11 NOTE — Telephone Encounter (Signed)
Received fax from [facility] requesting #2 be completed of PA form and copy of testosterone level be faxed to them.  Question 2 completed and labs faxed to [phone]. Awaiting approval / denial status.

## 2013-09-13 NOTE — Telephone Encounter (Signed)
Received notice from [facility] that Androgel had been approved.  Androgel was marked as drug being prescribed which was an error.  I meant to note that pt had tried and failed androgel in the past. Spoke with Anise at [facility] and she requests that we resubmit form with corrections. Corrections made and form refaxed to below #.  Awaiting approval / denial status.

## 2013-09-17 NOTE — Telephone Encounter (Signed)
Received notice from [facility] that PA was denied stating a baseline serum or free testosterone level that was below the normal range was not present.  I faxed the testosterone results from the last year along with the PA form which showed that pt's level was low 1 year ago. Spoke with [name] at [facility] [phone]) and was advised that they did receive pt's test results and they have been forwarded to the [facility] Provider review team and we should have a decision with 3 days.

## 2013-09-18 NOTE — Telephone Encounter (Signed)
Patient informed, understood/SLS 

## 2013-09-18 NOTE — Telephone Encounter (Signed)
Received approval from [facility] from 09/10/13 through 07/03/2038. Notified Buyer, retail at [employer]. Left message on pt's cell# and to call if any questions.

## 2013-11-18 ENCOUNTER — Encounter: Payer: Self-pay | Admitting: Family Medicine

## 2013-11-18 ENCOUNTER — Ambulatory Visit (INDEPENDENT_AMBULATORY_CARE_PROVIDER_SITE_OTHER): Payer: BC Managed Care – PPO | Admitting: Family Medicine

## 2013-11-18 ENCOUNTER — Telehealth: Payer: Self-pay | Admitting: *Deleted

## 2013-11-18 VITALS — BP 115/80 | HR 85 | Temp 98.1°F | Ht 69.0 in | Wt 191.1 lb

## 2013-11-18 DIAGNOSIS — E663 Overweight: Secondary | ICD-10-CM

## 2013-11-18 DIAGNOSIS — J339 Nasal polyp, unspecified: Secondary | ICD-10-CM | POA: Insufficient documentation

## 2013-11-18 DIAGNOSIS — Z0000 Encounter for general adult medical examination without abnormal findings: Secondary | ICD-10-CM

## 2013-11-18 DIAGNOSIS — E349 Endocrine disorder, unspecified: Secondary | ICD-10-CM

## 2013-11-18 DIAGNOSIS — Z1211 Encounter for screening for malignant neoplasm of colon: Secondary | ICD-10-CM | POA: Insufficient documentation

## 2013-11-18 DIAGNOSIS — E785 Hyperlipidemia, unspecified: Secondary | ICD-10-CM | POA: Insufficient documentation

## 2013-11-18 DIAGNOSIS — T7840XA Allergy, unspecified, initial encounter: Secondary | ICD-10-CM | POA: Insufficient documentation

## 2013-11-18 DIAGNOSIS — E291 Testicular hypofunction: Secondary | ICD-10-CM

## 2013-11-18 DIAGNOSIS — L259 Unspecified contact dermatitis, unspecified cause: Secondary | ICD-10-CM

## 2013-11-18 HISTORY — DX: Hyperlipidemia, unspecified: E78.5

## 2013-11-18 HISTORY — DX: Encounter for general adult medical examination without abnormal findings: Z00.00

## 2013-11-18 HISTORY — DX: Nasal polyp, unspecified: J33.9

## 2013-11-18 LAB — LIPID PANEL
Cholesterol: 207 mg/dL — ABNORMAL HIGH (ref 0–200)
HDL: 59 mg/dL (ref >39)
LDL CALC: 128 mg/dL — AB (ref 0–99)
TRIGLYCERIDES: 98 mg/dL (ref <150)
Total CHOL/HDL Ratio: 3.5 Ratio
VLDL: 20 mg/dL (ref 0–40)

## 2013-11-18 LAB — CBC
HEMATOCRIT: 45.9 % (ref 39.0–52.0)
Hemoglobin: 16.3 g/dL (ref 13.0–17.0)
MCH: 32.4 pg (ref 26.0–34.0)
MCHC: 35.5 g/dL (ref 30.0–36.0)
MCV: 91.3 fL (ref 78.0–100.0)
Platelets: 267 10*3/uL (ref 150–400)
RBC: 5.03 MIL/uL (ref 4.22–5.81)
RDW: 13.1 % (ref 11.5–15.5)
WBC: 5.6 10*3/uL (ref 4.0–10.5)

## 2013-11-18 LAB — HEPATIC FUNCTION PANEL
ALBUMIN: 4.6 g/dL (ref 3.5–5.2)
ALT: 24 U/L (ref 0–53)
AST: 24 U/L (ref 0–37)
Alkaline Phosphatase: 57 U/L (ref 39–117)
BILIRUBIN INDIRECT: 0.7 mg/dL (ref 0.2–1.2)
Bilirubin, Direct: 0.2 mg/dL (ref 0.0–0.3)
TOTAL PROTEIN: 7 g/dL (ref 6.0–8.3)
Total Bilirubin: 0.9 mg/dL (ref 0.2–1.2)

## 2013-11-18 LAB — RENAL FUNCTION PANEL
Albumin: 4.6 g/dL (ref 3.5–5.2)
BUN: 19 mg/dL (ref 6–23)
CALCIUM: 9.5 mg/dL (ref 8.4–10.5)
CO2: 26 mEq/L (ref 19–32)
CREATININE: 0.8 mg/dL (ref 0.50–1.35)
Chloride: 104 mEq/L (ref 96–112)
Glucose, Bld: 90 mg/dL (ref 70–99)
PHOSPHORUS: 3.4 mg/dL (ref 2.3–4.6)
Potassium: 4.5 mEq/L (ref 3.5–5.3)
Sodium: 138 mEq/L (ref 135–145)

## 2013-11-18 LAB — TSH: TSH: 1.024 u[IU]/mL (ref 0.350–4.500)

## 2013-11-18 MED ORDER — METHYLPREDNISOLONE (PAK) 4 MG PO TABS: ORAL_TABLET | ORAL | Status: DC

## 2013-11-18 MED ORDER — FLUTICASONE PROPIONATE 50 MCG/ACT NA SUSP
2.0000 | Freq: Every day | NASAL | Status: DC
Start: 2013-11-18 — End: 2014-06-12

## 2013-11-18 MED ORDER — MONTELUKAST SODIUM 10 MG PO TABS: 10.0000 mg | ORAL_TABLET | Freq: Every evening | ORAL | Status: DC | PRN

## 2013-11-18 MED ORDER — METHYLPREDNISOLONE ACETATE 40 MG/ML IJ SUSP: 40.0000 mg | Freq: Once | INTRAMUSCULAR | Status: DC

## 2013-11-18 MED ORDER — CETIRIZINE HCL 10 MG PO TABS: 10.0000 mg | ORAL_TABLET | Freq: Two times a day (BID) | ORAL | Status: DC

## 2013-11-18 MED ORDER — METHYLPREDNISOLONE ACETATE 40 MG/ML IJ SUSP
40.0000 mg | Freq: Once | INTRAMUSCULAR | Status: AC
  Administered 2013-11-18: 40 mg via INTRAMUSCULAR

## 2013-11-18 NOTE — Assessment & Plan Note (Signed)
Check level today. Last number was hi but he took an extra shot accidentally just prior to that blood draw, recent shot was about a week ago.

## 2013-11-18 NOTE — Assessment & Plan Note (Signed)
Encouraged heart healthy diet, increase exercise, avoid trans fats, consider a krill oil cap daily. Recheck level

## 2013-11-18 NOTE — Assessment & Plan Note (Signed)
Has been told he should consider surgery for his polyps

## 2013-11-18 NOTE — Assessment & Plan Note (Signed)
Patient encouraged to maintain heart healthy diet, regular exercise, adequate sleep. Consider daily probiotics. Take medications as prescribed 

## 2013-11-18 NOTE — Telephone Encounter (Signed)
Pt coming in prior to physical for fasting labs.  Orders entered as below:  CBC, lipid, LFTs, renal function panel, TSH and testosterone.

## 2013-11-18 NOTE — Assessment & Plan Note (Signed)
Zyrtec bid, add Singulair, Flonase. May use nasal saline prn

## 2013-11-18 NOTE — Assessment & Plan Note (Signed)
Encouraged DASH diet, decrease po intake and increase exercise as tolerated. Needs 7-8 hours of sleep nightly. Avoid trans fats, eat small, frequent meals every 4-5 hours with lean proteins, complex carbs and healthy fats. Minimize simple carbs, GMO foods. 

## 2013-11-18 NOTE — Assessment & Plan Note (Signed)
Depo medrol 80 mg IM today, then may start Medrol dosepak tomorrow as needed. Zyrtec bid and Benadryl qhs, try [place] [employer] and Oatmeal baths.

## 2013-11-18 NOTE — Patient Instructions (Addendum)
Try Verlee Monte to skin Sarna lotion for itch If work outside again, wash with ONEOK liquid  Nasal saline flush nose after being outside   Contact Dermatitis Contact dermatitis is a reaction to certain substances that touch the skin. Contact dermatitis can be either irritant contact dermatitis or allergic contact dermatitis. Irritant contact dermatitis does not require previous exposure to the substance for a reaction to occur.Allergic contact dermatitis only occurs if you have been exposed to the substance before. Upon a repeat exposure, your body reacts to the substance.  CAUSES  Many substances can cause contact dermatitis. Irritant dermatitis is most commonly caused by repeated exposure to mildly irritating substances, such as:  Makeup.  Soaps.  Detergents.  Bleaches.  Acids.  Metal salts, such as nickel. Allergic contact dermatitis is most commonly caused by exposure to:  Poisonous plants.  Chemicals (deodorants, shampoos).  Jewelry.  Latex.  Neomycin in triple antibiotic cream.  Preservatives in products, including clothing. SYMPTOMS  The area of skin that is exposed may develop:  Dryness or flaking.  Redness.  Cracks.  Itching.  Pain or a burning sensation.  Blisters. With allergic contact dermatitis, there may also be swelling in areas such as the eyelids, mouth, or genitals.  DIAGNOSIS  Your caregiver can usually tell what the problem is by doing a physical exam. In cases where the cause is uncertain and an allergic contact dermatitis is suspected, a patch skin test may be performed to help determine the cause of your dermatitis. TREATMENT Treatment includes protecting the skin from further contact with the irritating substance by avoiding that substance if possible. Barrier creams, powders, and gloves may be helpful. Your caregiver may also recommend:  Steroid creams or ointments applied 2 times daily. For best results, soak the rash area  in cool water for 20 minutes. Then apply the medicine. Cover the area with a plastic wrap. You can store the steroid cream in the refrigerator for a "chilly" effect on your rash. That may decrease itching. Oral steroid medicines may be needed in more severe cases.  Antibiotics or antibacterial ointments if a skin infection is present.  Antihistamine lotion or an antihistamine taken by mouth to ease itching.  Lubricants to keep moisture in your skin.  Burow's solution to reduce redness and soreness or to dry a weeping rash. Mix one packet or tablet of solution in 2 cups cool water. Dip a clean washcloth in the mixture, wring it out a bit, and put it on the affected area. Leave the cloth in place for 30 minutes. Do this as often as possible throughout the day.  Taking several cornstarch or baking soda baths daily if the area is too large to cover with a washcloth. Harsh chemicals, such as alkalis or acids, can cause skin damage that is like a burn. You should flush your skin for 15 to 20 minutes with cold water after such an exposure. You should also seek immediate medical care after exposure. Bandages (dressings), antibiotics, and pain medicine may be needed for severely irritated skin.  HOME CARE INSTRUCTIONS  Avoid the substance that caused your reaction.  Keep the area of skin that is affected away from hot water, soap, sunlight, chemicals, acidic substances, or anything else that would irritate your skin.  Do not scratch the rash. Scratching may cause the rash to become infected.  You may take cool baths to help stop the itching.  Only take over-the-counter or prescription medicines as directed by your caregiver.  See  your caregiver for follow-up care as directed to make sure your skin is healing properly. SEEK MEDICAL CARE IF:   Your condition is not better after 3 days of treatment.  You seem to be getting worse.  You see signs of infection such as swelling, tenderness, redness,  soreness, or warmth in the affected area.  You have any problems related to your medicines. Document Released: 06/17/2000 Document Revised: 09/12/2011 Document Reviewed: 11/23/2010 [facility] Patient Information 2014 [place], [place].

## 2013-11-18 NOTE — Progress Notes (Signed)
Pre visit review using our clinic review tool, if applicable. No additional management support is needed unless otherwise documented below in the visit note. 

## 2013-11-18 NOTE — Progress Notes (Signed)
Patient ID: Anthony Reese, male   DOB: [birth year], 43 y.o.   MRN: [id number]  Anthony Reese [id number] May 19, 1971 11/18/2013      Progress Note-Follow Up  Subjective  Chief Complaint  No chief complaint on file.   HPI  Patient is a 43 year old male in today for routine medical care he is in today for annual exam. He was working in his neighbors yard over the weekend when he got into some poison ivy. He is painful itchy blistering rash on his arms, slightly on his legs and on his face most notably over his left eye. He has tried some over-the-counter medication without any significant relief. Prior to this episode he was doing fairly well. He had no recent illness although he struggles with constant nasal congestion due to nasal polyps and had allergies. Well controlled, no changes to meds. Encouraged heart healthy diet such as the DASH diet and exercise as tolerated.   Past Medical History  Diagnosis Date  . Headache, cluster, episodic   . Low testosterone   . Overweight   . Tobacco use disorder 07/31/2011  . Radiculopathy affecting upper extremity 11/20/2011  . Injury of left shoulder 01/30/2013  . H/O tobacco use, presenting hazards to health 07/31/2011  . Other and unspecified hyperlipidemia 11/18/2013  . Nasal polyp 11/18/2013  . Preventative health care 11/18/2013    Past Surgical History  Procedure Laterality Date  . Knee surgery      Family History  Problem Relation Age of Onset  . Cancer Mother     leukemia, pancreatic cancer  . Cancer Maternal Aunt     lung  . Cancer Maternal Grandfather     colon     History   Social History  . Marital Status: Married    Spouse Name: N/A    Number of Children: N/A  . Years of Education: N/A   Occupational History  . Not on file.   Social History Main Topics  . Smoking status: Former Smoker    Types: Cigarettes    Quit date: 01/31/2012  . Smokeless tobacco: Never Used  . Alcohol Use: Yes  . Drug Use: No  . Sexual  Activity: Yes    Partners: Female   Other Topics Concern  . Not on file   Social History Narrative  . No narrative on file    Current Outpatient Prescriptions on File Prior to Visit  Medication Sig Dispense Refill  . testosterone cypionate (DEPOTESTOTERONE CYPIONATE) 200 MG/ML injection Inject 0.5 mLs (100 mg total) into the muscle every 14 (fourteen) days.  10 mL  0   No current facility-administered medications on file prior to visit.    Allergies  Allergen Reactions  . Imitrex [Sumatriptan]     Swelling around eyes and SOb  . Penicillins     Review of Systems  Review of Systems  Constitutional: Negative for fever and malaise/fatigue.  HENT: Negative for congestion.   Eyes: Negative for discharge.  Respiratory: Negative for shortness of breath.   Cardiovascular: Negative for chest pain, palpitations and leg swelling.  Gastrointestinal: Negative for nausea, abdominal pain and diarrhea.  Genitourinary: Negative for dysuria.  Musculoskeletal: Negative for falls.  Skin: Positive for itching and rash.  Neurological: Negative for loss of consciousness and headaches.  Endo/Heme/Allergies: Negative for polydipsia.  Psychiatric/Behavioral: Negative for depression and suicidal ideas. The patient is not nervous/anxious and does not have insomnia.     Objective  BP 115/80  Pulse 85  Temp(Src) 98.1 F (  36.7 C) (Oral)  Ht 5\' 9"  (1.753 m)  Wt 191 lb 1.9 oz (86.691 kg)  BMI 28.21 kg/m2  SpO2 96%  Physical Exam  Physical Exam  Constitutional: He is oriented to person, place, and time and well-developed, well-nourished, and in no distress. No distress.  HENT:  Head: Normocephalic and atraumatic.  Eyes: Conjunctivae are normal.  Neck: Neck supple. No thyromegaly present.  Cardiovascular: Normal rate, regular rhythm and normal heart sounds.   No murmur heard. Pulmonary/Chest: Effort normal and breath sounds normal. No respiratory distress.  Abdominal: Soft. Bowel sounds  are normal. He exhibits no distension and no mass. There is no tenderness.  Musculoskeletal: He exhibits no edema.  Neurological: He is alert and oriented to person, place, and time.  Skin: Skin is warm. Rash noted. There is erythema.  Numerous scattered maculopapular lesions with erythematous base and vesicles noted throughout arms and on lower legs and face, some linear patterns.   Psychiatric: Memory, affect and judgment normal.    Lab Results  Component Value Date   TSH 1.16 04/13/2012   Lab Results  Component Value Date   WBC 5.6 11/18/2013   HGB 16.3 11/18/2013   HCT 45.9 11/18/2013   MCV 91.3 11/18/2013   PLT 267 11/18/2013   Lab Results  Component Value Date   CREATININE 0.9 04/13/2012   BUN 17 04/13/2012   NA 142 04/13/2012   K 4.2 04/13/2012   CL 105 04/13/2012   CO2 28 04/13/2012   Lab Results  Component Value Date   ALT 26 04/13/2012   AST 23 04/13/2012   ALKPHOS 43 04/13/2012   BILITOT 1.0 04/13/2012   Lab Results  Component Value Date   CHOL 264* 04/13/2012   Lab Results  Component Value Date   HDL 54.70 04/13/2012   No results found for this basename: [facility]   Lab Results  Component Value Date   TRIG 103.0 04/13/2012   Lab Results  Component Value Date   CHOLHDL 5 04/13/2012     Assessment & Plan  Testosterone deficiency Check level today. Last number was hi but he took an extra shot accidentally just prior to that blood draw, recent shot was about a week ago.   Contact dermatitis Depo medrol 80 mg IM today, then may start Medrol dosepak tomorrow as needed. Zyrtec bid and Benadryl qhs, try [employer] and Oatmeal baths.  Overweight Encouraged DASH diet, decrease po intake and increase exercise as tolerated. Needs 7-8 hours of sleep nightly. Avoid trans fats, eat small, frequent meals every 4-5 hours with lean proteins, complex carbs and healthy fats. Minimize simple carbs, GMO foods.  Allergic state Zyrtec bid, add Singulair,  Flonase. May use nasal saline prn  Nasal polyp Has been told he should consider surgery for his polyps  Preventative health care Patient encouraged to maintain heart healthy diet, regular exercise, adequate sleep. Consider daily probiotics. Take medications as prescribed  Other and unspecified hyperlipidemia Encouraged heart healthy diet, increase exercise, avoid trans fats, consider a krill oil cap daily. Recheck level

## 2013-11-19 ENCOUNTER — Telehealth: Payer: Self-pay | Admitting: *Deleted

## 2013-11-19 LAB — TESTOSTERONE: Testosterone: 203 ng/dL — ABNORMAL LOW (ref 300–890)

## 2013-11-19 NOTE — Telephone Encounter (Signed)
I have never heard of it. Cannot find it in drug references. Is it a steroid cream? Does he have an old prescription I can look at? I can switch to a different cream possible

## 2013-11-19 NOTE — Telephone Encounter (Signed)
Spelling of medication should be Olux (foam).  Please advise.

## 2013-11-19 NOTE — Telephone Encounter (Signed)
Pt called wanting to know if he can get an Rx for Olex-e for his poison ivy. Has co-worker that used this in the past and states it worked very well.  Please advise?

## 2013-11-19 NOTE — Telephone Encounter (Signed)
Pt will forward additional details to [place] re: below medication.

## 2013-11-19 NOTE — Telephone Encounter (Signed)
Olux 0.05% foam (clobetasol propionate).

## 2013-11-19 NOTE — Telephone Encounter (Signed)
Notify Olux foam apply bid prn rash. Disp # 1 bottle with 1 rf is OK please send to pharmacy

## 2013-11-20 MED ORDER — CLOBETASOL PROPIONATE 0.05 % EX FOAM: Freq: Two times a day (BID) | CUTANEOUS | Status: DC

## 2013-11-20 NOTE — Telephone Encounter (Signed)
Rx sent 

## 2013-11-27 ENCOUNTER — Telehealth: Payer: Self-pay | Admitting: *Deleted

## 2013-11-27 DIAGNOSIS — E349 Endocrine disorder, unspecified: Secondary | ICD-10-CM

## 2013-11-27 NOTE — Telephone Encounter (Signed)
Copy of results sent to pt. Future lab order entered.

## 2013-11-27 NOTE — Telephone Encounter (Signed)
Message copied by Ronny Flurry on Wed Nov 27, 2013 10:33 AM ------      Message from: Penni Homans A      Created: Tue Nov 19, 2013  9:56 PM       Notify looks look mostly good. Cholesterol much better, testosterone is low increase his testosterone from 0.5 ml every 2 weeks to 1 ml every 2 weeks then recheck level in 12 weeks ------

## 2013-12-03 MED ORDER — TESTOSTERONE CYPIONATE 200 MG/ML IM SOLN
200.0000 mg | INTRAMUSCULAR | Status: DC
Start: 2013-12-03 — End: 2014-04-21

## 2013-12-03 NOTE — Telephone Encounter (Addendum)
Pt needs refill with new directions. Refill faxed to [facility].

## 2013-12-03 NOTE — Addendum Note (Signed)
Addended by: Kelle Darting A on: 12/03/2013 02:46 PM   Modules accepted: Orders

## 2014-04-21 ENCOUNTER — Telehealth: Payer: Self-pay

## 2014-04-21 MED ORDER — TESTOSTERONE CYPIONATE 200 MG/ML IM SOLN: 200.0000 mg | INTRAMUSCULAR | Status: DC

## 2014-04-21 NOTE — Telephone Encounter (Signed)
Patient requesting refill of testosterone to [facility].  New Rx sent, per protocol sig

## 2014-06-12 ENCOUNTER — Ambulatory Visit (INDEPENDENT_AMBULATORY_CARE_PROVIDER_SITE_OTHER): Payer: BC Managed Care – PPO | Admitting: Family Medicine

## 2014-06-12 ENCOUNTER — Encounter: Payer: Self-pay | Admitting: Family Medicine

## 2014-06-12 VITALS — BP 112/88 | HR 76 | Temp 98.2°F | Ht 67.75 in | Wt 203.0 lb

## 2014-06-12 DIAGNOSIS — E663 Overweight: Secondary | ICD-10-CM | POA: Diagnosis not present

## 2014-06-12 DIAGNOSIS — E039 Hypothyroidism, unspecified: Secondary | ICD-10-CM | POA: Diagnosis not present

## 2014-06-12 DIAGNOSIS — E785 Hyperlipidemia, unspecified: Secondary | ICD-10-CM | POA: Diagnosis not present

## 2014-06-12 DIAGNOSIS — T7840XS Allergy, unspecified, sequela: Secondary | ICD-10-CM

## 2014-06-12 DIAGNOSIS — E291 Testicular hypofunction: Secondary | ICD-10-CM

## 2014-06-12 DIAGNOSIS — E349 Endocrine disorder, unspecified: Secondary | ICD-10-CM

## 2014-06-12 DIAGNOSIS — G44019 Episodic cluster headache, not intractable: Secondary | ICD-10-CM

## 2014-06-12 DIAGNOSIS — Z0000 Encounter for general adult medical examination without abnormal findings: Secondary | ICD-10-CM

## 2014-06-12 DIAGNOSIS — J339 Nasal polyp, unspecified: Secondary | ICD-10-CM

## 2014-06-12 DIAGNOSIS — Z6831 Body mass index (BMI) 31.0-31.9, adult: Secondary | ICD-10-CM

## 2014-06-12 NOTE — Progress Notes (Signed)
Anthony Reese  [id number] [birth year] 06/12/2014      Progress Note-Follow Up  Subjective  Chief Complaint  Chief Complaint  Patient presents with  . Annual Exam    physical    HPI  Patient is a 43 y.o. male in today for routine medical care. Patient in for annual exam. Doing fairly well. No recent flare in his headaches. Is working excessive hours and acknowledges trouble with allergies. Has been advised to proceed with an polypectomy in his nose but has not found the time. Struggling with congestion routinely. No fevers or chills. No other acute complaints or recent illness. Denies CP/palp/SOB/HA/congestion/fevers/GI or GU c/o. Taking meds as prescribed  Past Medical History  Diagnosis Date  . Headache, cluster, episodic   . Low testosterone   . Overweight(278.02)   . Tobacco use disorder 07/31/2011  . Radiculopathy affecting upper extremity 11/20/2011  . Injury of left shoulder 01/30/2013  . H/O tobacco use, presenting hazards to health 07/31/2011  . Other and unspecified hyperlipidemia 11/18/2013  . Nasal polyp 11/18/2013  . Preventative health care 11/18/2013  . Chicken pox     Past Surgical History  Procedure Laterality Date  . Knee surgery    . Carpal tunnel release  2013    right  . Biceps tendon repair  2012    right    Family History  Problem Relation Age of Onset  . Cancer Mother     leukemia, pancreatic cancer  . Cancer Maternal Aunt     lung  . Cancer Maternal Grandfather     colon     History   Social History  . Marital Status: Married    Spouse Name: N/A    Number of Children: N/A  . Years of Education: N/A   Occupational History  . Not on file.   Social History Main Topics  . Smoking status: Former Smoker    Types: Cigarettes    Quit date: 01/31/2012  . Smokeless tobacco: Never Used  . Alcohol Use: Yes  . Drug Use: No  . Sexual Activity:    Partners: Female   Other Topics Concern  . Not on file   Social History Narrative     Current Outpatient Prescriptions on File Prior to Visit  Medication Sig Dispense Refill  . cetirizine (ZYRTEC) 10 MG tablet Take 1 tablet (10 mg total) by mouth 2 (two) times daily. 60 tablet 11  . testosterone cypionate (DEPOTESTOTERONE CYPIONATE) 200 MG/ML injection Inject 1 mL (200 mg total) into the muscle every 14 (fourteen) days. 10 mL 2   No current facility-administered medications on file prior to visit.    Allergies  Allergen Reactions  . Imitrex [Sumatriptan]     Swelling around eyes and SOb  . Penicillins     Review of Systems  Review of Systems  Constitutional: Positive for malaise/fatigue. Negative for fever.  HENT: Positive for congestion.   Eyes: Negative for discharge.  Respiratory: Negative for shortness of breath.   Cardiovascular: Negative for chest pain, palpitations and leg swelling.  Gastrointestinal: Negative for nausea, abdominal pain and diarrhea.  Genitourinary: Negative for dysuria.  Musculoskeletal: Negative for falls.  Skin: Negative for rash.  Neurological: Negative for loss of consciousness and headaches.  Endo/Heme/Allergies: Negative for polydipsia.  Psychiatric/Behavioral: Negative for depression and suicidal ideas. The patient is not nervous/anxious and does not have insomnia.     Objective  BP 112/88 mmHg  Pulse 76  Temp(Src) 98.2 F (36.8 C) (Oral)  Ht 5'  7.75" (1.721 m)  Wt 203 lb (92.08 kg)  BMI 31.09 kg/m2  SpO2 98%  Physical Exam  Physical Exam  Constitutional: He is oriented to person, place, and time and well-developed, well-nourished, and in no distress. No distress.  HENT:  Head: Normocephalic and atraumatic.  Eyes: Conjunctivae are normal.  Neck: Neck supple. No thyromegaly present.  Cardiovascular: Normal rate, regular rhythm and normal heart sounds.   No murmur heard. Pulmonary/Chest: Effort normal and breath sounds normal. No respiratory distress.  Abdominal: He exhibits no distension and no mass. There is  no tenderness.  Musculoskeletal: Normal range of motion. He exhibits no edema or tenderness.  Neurological: He is alert and oriented to person, place, and time.  Skin: Skin is warm.  Psychiatric: Memory, affect and judgment normal.    Lab Results  Component Value Date   TSH 1.024 11/18/2013   Lab Results  Component Value Date   WBC 5.6 11/18/2013   HGB 16.3 11/18/2013   HCT 45.9 11/18/2013   MCV 91.3 11/18/2013   PLT 267 11/18/2013   Lab Results  Component Value Date   CREATININE 0.80 11/18/2013   BUN 19 11/18/2013   NA 138 11/18/2013   K 4.5 11/18/2013   CL 104 11/18/2013   CO2 26 11/18/2013   Lab Results  Component Value Date   ALT 24 11/18/2013   AST 24 11/18/2013   ALKPHOS 57 11/18/2013   BILITOT 0.9 11/18/2013   Lab Results  Component Value Date   CHOL 207* 11/18/2013   Lab Results  Component Value Date   HDL 59 11/18/2013   Lab Results  Component Value Date   LDLCALC 128* 11/18/2013   Lab Results  Component Value Date   TRIG 98 11/18/2013   Lab Results  Component Value Date   CHOLHDL 3.5 11/18/2013     Assessment & Plan  Testosterone deficiency Continue treatment, no shot in nearly 2 weeks. Recheck levels today  Overweight Encouraged DASH diet, decrease po intake and increase exercise as tolerated. Needs 7-8 hours of sleep nightly. Avoid trans fats, eat small, frequent meals every 4-5 hours with lean proteins, complex carbs and healthy fats. Minimize simple carbs, GMO foods.  Preventative health care Patient encouraged to maintain heart healthy diet, regular exercise, adequate sleep. Consider daily probiotics. Take medications as prescribed. Declines flu shot.  Hyperlipidemia Encouraged heart healthy diet, increase exercise, avoid trans fats, consider a krill oil cap daily  Nasal polyp Has been encouraged to proceed with surgery, is unable to proceed at this time, may proceed in future  Allergic state Encouraged Zyrtec bid and daily  Flonase and Singulair  Headache, cluster, episodic No recent flares. Encouraged increased hydration, 64 ounces of clear fluids daily. Minimize alcohol and caffeine. Eat small frequent meals with lean proteins and complex carbs. Avoid high and low blood sugars. Get adequate sleep, 7-8 hours a night. Needs exercise daily preferably in the morning.

## 2014-06-12 NOTE — Progress Notes (Signed)
Pre visit review using our clinic review tool, if applicable. No additional management support is needed unless otherwise documented below in the visit note. 

## 2014-06-12 NOTE — Patient Instructions (Signed)
Preventive Care for Adults A healthy lifestyle and preventive care can promote health and wellness. Preventive health guidelines for men include the following key practices:  A routine yearly physical is a good way to check with your health care provider about your health and preventative screening. It is a chance to share any concerns and updates on your health and to receive a thorough exam.  Visit your dentist for a routine exam and preventative care every 6 months. Brush your teeth twice a day and floss once a day. Good oral hygiene prevents tooth decay and gum disease.  The frequency of eye exams is based on your age, health, family medical history, use of contact lenses, and other factors. Follow your health care provider's recommendations for frequency of eye exams.  Eat a healthy diet. Foods such as vegetables, fruits, whole grains, low-fat dairy products, and lean protein foods contain the nutrients you need without too many calories. Decrease your intake of foods high in solid fats, added sugars, and salt. Eat the right amount of calories for you.Get information about a proper diet from your health care provider, if necessary.  Regular physical exercise is one of the most important things you can do for your health. Most adults should get at least 150 minutes of moderate-intensity exercise (any activity that increases your heart rate and causes you to sweat) each week. In addition, most adults need muscle-strengthening exercises on 2 or more days a week.  Maintain a healthy weight. The body mass index (BMI) is a screening tool to identify possible weight problems. It provides an estimate of body fat based on height and weight. Your health care provider can find your BMI and can help you achieve or maintain a healthy weight.For adults 20 years and older:  A BMI below 18.5 is considered underweight.  A BMI of 18.5 to 24.9 is normal.  A BMI of 25 to 29.9 is considered overweight.  A BMI  of 30 and above is considered obese.  Maintain normal blood lipids and cholesterol levels by exercising and minimizing your intake of saturated fat. Eat a balanced diet with plenty of fruit and vegetables. Blood tests for lipids and cholesterol should begin at age 50 and be repeated every 5 years. If your lipid or cholesterol levels are high, you are over 50, or you are at high risk for heart disease, you may need your cholesterol levels checked more frequently.Ongoing high lipid and cholesterol levels should be treated with medicines if diet and exercise are not working.  If you smoke, find out from your health care provider how to quit. If you do not use tobacco, do not start.  Lung cancer screening is recommended for adults aged 73-80 years who are at high risk for developing lung cancer because of a history of smoking. A yearly low-dose CT scan of the lungs is recommended for people who have at least a 30-pack-year history of smoking and are a current smoker or have quit within the past 15 years. A pack year of smoking is smoking an average of 1 pack of cigarettes a day for 1 year (for example: 1 pack a day for 30 years or 2 packs a day for 15 years). Yearly screening should continue until the smoker has stopped smoking for at least 15 years. Yearly screening should be stopped for people who develop a health problem that would prevent them from having lung cancer treatment.  If you choose to drink alcohol, do not have more than  2 drinks per day. One drink is considered to be 12 ounces (355 mL) of beer, 5 ounces (148 mL) of wine, or 1.5 ounces (44 mL) of liquor.  Avoid use of street drugs. Do not share needles with anyone. Ask for help if you need support or instructions about stopping the use of drugs.  High blood pressure causes heart disease and increases the risk of stroke. Your blood pressure should be checked at least every 1-2 years. Ongoing high blood pressure should be treated with  medicines, if weight loss and exercise are not effective.  If you are 45-79 years old, ask your health care provider if you should take aspirin to prevent heart disease.  Diabetes screening involves taking a blood sample to check your fasting blood sugar level. This should be done once every 3 years, after age 45, if you are within normal weight and without risk factors for diabetes. Testing should be considered at a younger age or be carried out more frequently if you are overweight and have at least 1 risk factor for diabetes.  Colorectal cancer can be detected and often prevented. Most routine colorectal cancer screening begins at the age of 50 and continues through age 75. However, your health care provider may recommend screening at an earlier age if you have risk factors for colon cancer. On a yearly basis, your health care provider may provide home test kits to check for hidden blood in the stool. Use of a small camera at the end of a tube to directly examine the colon (sigmoidoscopy or colonoscopy) can detect the earliest forms of colorectal cancer. Talk to your health care provider about this at age 50, when routine screening begins. Direct exam of the colon should be repeated every 5-10 years through age 75, unless early forms of precancerous polyps or small growths are found.  People who are at an increased risk for hepatitis B should be screened for this virus. You are considered at high risk for hepatitis B if:  You were born in a country where hepatitis B occurs often. Talk with your health care provider about which countries are considered high risk.  Your parents were born in a high-risk country and you have not received a shot to protect against hepatitis B (hepatitis B vaccine).  You have HIV or AIDS.  You use needles to inject street drugs.  You live with, or have sex with, someone who has hepatitis B.  You are a man who has sex with other men (MSM).  You get hemodialysis  treatment.  You take certain medicines for conditions such as cancer, organ transplantation, and autoimmune conditions.  Hepatitis C blood testing is recommended for all people born from 1945 through 1965 and any individual with known risks for hepatitis C.  Practice safe sex. Use condoms and avoid high-risk sexual practices to reduce the spread of sexually transmitted infections (STIs). STIs include gonorrhea, chlamydia, syphilis, trichomonas, herpes, HPV, and human immunodeficiency virus (HIV). Herpes, HIV, and HPV are viral illnesses that have no cure. They can result in disability, cancer, and death.  If you are at risk of being infected with HIV, it is recommended that you take a prescription medicine daily to prevent HIV infection. This is called preexposure prophylaxis (PrEP). You are considered at risk if:  You are a man who has sex with other men (MSM) and have other risk factors.  You are a heterosexual man, are sexually active, and are at increased risk for HIV infection.    You take drugs by injection.  You are sexually active with a partner who has HIV.  Talk with your health care provider about whether you are at high risk of being infected with HIV. If you choose to begin PrEP, you should first be tested for HIV. You should then be tested every 3 months for as long as you are taking PrEP.  A one-time screening for abdominal aortic aneurysm (AAA) and surgical repair of large AAAs by ultrasound are recommended for men ages 32 to 67 years who are current or former smokers.  Healthy men should no longer receive prostate-specific antigen (PSA) blood tests as part of routine cancer screening. Talk with your health care provider about prostate cancer screening.  Testicular cancer screening is not recommended for adult males who have no symptoms. Screening includes self-exam, a health care provider exam, and other screening tests. Consult with your health care provider about any symptoms  you have or any concerns you have about testicular cancer.  Use sunscreen. Apply sunscreen liberally and repeatedly throughout the day. You should seek shade when your shadow is shorter than you. Protect yourself by wearing long sleeves, pants, a wide-brimmed hat, and sunglasses year round, whenever you are outdoors.  Once a month, do a whole-body skin exam, using a mirror to look at the skin on your back. Tell your health care provider about new moles, moles that have irregular borders, moles that are larger than a pencil eraser, or moles that have changed in shape or color.  Stay current with required vaccines (immunizations).  Influenza vaccine. All adults should be immunized every year.  Tetanus, diphtheria, and acellular pertussis (Td, Tdap) vaccine. An adult who has not previously received Tdap or who does not know his vaccine status should receive 1 dose of Tdap. This initial dose should be followed by tetanus and diphtheria toxoids (Td) booster doses every 10 years. Adults with an unknown or incomplete history of completing a 3-dose immunization series with Td-containing vaccines should begin or complete a primary immunization series including a Tdap dose. Adults should receive a Td booster every 10 years.  Varicella vaccine. An adult without evidence of immunity to varicella should receive 2 doses or a second dose if he has previously received 1 dose.  Human papillomavirus (HPV) vaccine. Males aged 68-21 years who have not received the vaccine previously should receive the 3-dose series. Males aged 22-26 years may be immunized. Immunization is recommended through the age of 6 years for any male who has sex with males and did not get any or all doses earlier. Immunization is recommended for any person with an immunocompromised condition through the age of 49 years if he did not get any or all doses earlier. During the 3-dose series, the second dose should be obtained 4-8 weeks after the first  dose. The third dose should be obtained 24 weeks after the first dose and 16 weeks after the second dose.  Zoster vaccine. One dose is recommended for adults aged 50 years or older unless certain conditions are present.  Measles, mumps, and rubella (MMR) vaccine. Adults born before [birth year] generally are considered immune to measles and mumps. Adults born in [birth year] or later should have 1 or more doses of MMR vaccine unless there is a contraindication to the vaccine or there is laboratory evidence of immunity to each of the three diseases. A routine second dose of MMR vaccine should be obtained at least 28 days after the first dose for students attending postsecondary  schools, health care workers, or international travelers. People who received inactivated measles vaccine or an unknown type of measles vaccine during 1963-1967 should receive 2 doses of MMR vaccine. People who received inactivated mumps vaccine or an unknown type of mumps vaccine before 1979 and are at high risk for mumps infection should consider immunization with 2 doses of MMR vaccine. Unvaccinated health care workers born before 1957 who lack laboratory evidence of measles, mumps, or rubella immunity or laboratory confirmation of disease should consider measles and mumps immunization with 2 doses of MMR vaccine or rubella immunization with 1 dose of MMR vaccine.  Pneumococcal 13-valent conjugate (PCV13) vaccine. When indicated, a person who is uncertain of his immunization history and has no record of immunization should receive the PCV13 vaccine. An adult aged 19 years or older who has certain medical conditions and has not been previously immunized should receive 1 dose of PCV13 vaccine. This PCV13 should be followed with a dose of pneumococcal polysaccharide (PPSV23) vaccine. The PPSV23 vaccine dose should be obtained at least 8 weeks after the dose of PCV13 vaccine. An adult aged 19 years or older who has certain medical conditions and  previously received 1 or more doses of PPSV23 vaccine should receive 1 dose of PCV13. The PCV13 vaccine dose should be obtained 1 or more years after the last PPSV23 vaccine dose.  Pneumococcal polysaccharide (PPSV23) vaccine. When PCV13 is also indicated, PCV13 should be obtained first. All adults aged 65 years and older should be immunized. An adult younger than age 65 years who has certain medical conditions should be immunized. Any person who resides in a nursing home or long-term care facility should be immunized. An adult smoker should be immunized. People with an immunocompromised condition and certain other conditions should receive both PCV13 and PPSV23 vaccines. People with human immunodeficiency virus (HIV) infection should be immunized as soon as possible after diagnosis. Immunization during chemotherapy or radiation therapy should be avoided. Routine use of PPSV23 vaccine is not recommended for American Indians, [place] Natives, or people younger than 65 years unless there are medical conditions that require PPSV23 vaccine. When indicated, people who have unknown immunization and have no record of immunization should receive PPSV23 vaccine. One-time revaccination 5 years after the first dose of PPSV23 is recommended for people aged 19-64 years who have chronic kidney failure, nephrotic syndrome, asplenia, or immunocompromised conditions. People who received 1-2 doses of PPSV23 before age 65 years should receive another dose of PPSV23 vaccine at age 65 years or later if at least 5 years have passed since the previous dose. Doses of PPSV23 are not needed for people immunized with PPSV23 at or after age 65 years.  Meningococcal vaccine. Adults with asplenia or persistent complement component deficiencies should receive 2 doses of quadrivalent meningococcal conjugate (MenACWY-D) vaccine. The doses should be obtained at least 2 months apart. Microbiologists working with certain meningococcal bacteria,  military recruits, people at risk during an outbreak, and people who travel to or live in countries with a high rate of meningitis should be immunized. A first-year college student up through age 21 years who is living in a residence hall should receive a dose if he did not receive a dose on or after his 16th birthday. Adults who have certain high-risk conditions should receive one or more doses of vaccine.  Hepatitis A vaccine. Adults who wish to be protected from this disease, have certain high-risk conditions, work with hepatitis A-infected animals, work in hepatitis A research labs, or   travel to or work in countries with a high rate of hepatitis A should be immunized. Adults who were previously unvaccinated and who anticipate close contact with an international adoptee during the first 60 days after arrival in the [place] from a country with a high rate of hepatitis A should be immunized.  Hepatitis B vaccine. Adults should be immunized if they wish to be protected from this disease, have certain high-risk conditions, may be exposed to blood or other infectious body fluids, are household contacts or sex partners of hepatitis B positive people, are clients or workers in certain care facilities, or travel to or work in countries with a high rate of hepatitis B.  Haemophilus influenzae type b (Hib) vaccine. A previously unvaccinated person with asplenia or sickle cell disease or having a scheduled splenectomy should receive 1 dose of Hib vaccine. Regardless of previous immunization, a recipient of a hematopoietic stem cell transplant should receive a 3-dose series 6-12 months after his successful transplant. Hib vaccine is not recommended for adults with HIV infection. Preventive Service / Frequency Ages 52 to 17  Blood pressure check.** / Every 1 to 2 years.  Lipid and cholesterol check.** / Every 5 years beginning at age 69.  Hepatitis C blood test.** / For any individual with known risks for  hepatitis C.  Skin self-exam. / Monthly.  Influenza vaccine. / Every year.  Tetanus, diphtheria, and acellular pertussis (Tdap, Td) vaccine.** / Consult your health care provider. 1 dose of Td every 10 years.  Varicella vaccine.** / Consult your health care provider.  HPV vaccine. / 3 doses over 6 months, if 72 or younger.  Measles, mumps, rubella (MMR) vaccine.** / You need at least 1 dose of MMR if you were born in 1957 or later. You may also need a second dose.  Pneumococcal 13-valent conjugate (PCV13) vaccine.** / Consult your health care provider.  Pneumococcal polysaccharide (PPSV23) vaccine.** / 1 to 2 doses if you smoke cigarettes or if you have certain conditions.  Meningococcal vaccine.** / 1 dose if you are age 35 to 60 years and a Market researcher living in a residence hall, or have one of several medical conditions. You may also need additional booster doses.  Hepatitis A vaccine.** / Consult your health care provider.  Hepatitis B vaccine.** / Consult your health care provider.  Haemophilus influenzae type b (Hib) vaccine.** / Consult your health care provider. Ages 35 to 8  Blood pressure check.** / Every 1 to 2 years.  Lipid and cholesterol check.** / Every 5 years beginning at age 57.  Lung cancer screening. / Every year if you are aged 44-80 years and have a 30-pack-year history of smoking and currently smoke or have quit within the past 15 years. Yearly screening is stopped once you have quit smoking for at least 15 years or develop a health problem that would prevent you from having lung cancer treatment.  Fecal occult blood test (FOBT) of stool. / Every year beginning at age 55 and continuing until age 73. You may not have to do this test if you get a colonoscopy every 10 years.  Flexible sigmoidoscopy** or colonoscopy.** / Every 5 years for a flexible sigmoidoscopy or every 10 years for a colonoscopy beginning at age 28 and continuing until age  1.  Hepatitis C blood test.** / For all people born from [birth year] through 1965 and any individual with known risks for hepatitis C.  Skin self-exam. / Monthly.  Influenza vaccine. / Every  year.  Tetanus, diphtheria, and acellular pertussis (Tdap/Td) vaccine.** / Consult your health care provider. 1 dose of Td every 10 years.  Varicella vaccine.** / Consult your health care provider.  Zoster vaccine.** / 1 dose for adults aged 53 years or older.  Measles, mumps, rubella (MMR) vaccine.** / You need at least 1 dose of MMR if you were born in 1957 or later. You may also need a second dose.  Pneumococcal 13-valent conjugate (PCV13) vaccine.** / Consult your health care provider.  Pneumococcal polysaccharide (PPSV23) vaccine.** / 1 to 2 doses if you smoke cigarettes or if you have certain conditions.  Meningococcal vaccine.** / Consult your health care provider.  Hepatitis A vaccine.** / Consult your health care provider.  Hepatitis B vaccine.** / Consult your health care provider.  Haemophilus influenzae type b (Hib) vaccine.** / Consult your health care provider. Ages 77 and over  Blood pressure check.** / Every 1 to 2 years.  Lipid and cholesterol check.**/ Every 5 years beginning at age 85.  Lung cancer screening. / Every year if you are aged 55-80 years and have a 30-pack-year history of smoking and currently smoke or have quit within the past 15 years. Yearly screening is stopped once you have quit smoking for at least 15 years or develop a health problem that would prevent you from having lung cancer treatment.  Fecal occult blood test (FOBT) of stool. / Every year beginning at age 33 and continuing until age 11. You may not have to do this test if you get a colonoscopy every 10 years.  Flexible sigmoidoscopy** or colonoscopy.** / Every 5 years for a flexible sigmoidoscopy or every 10 years for a colonoscopy beginning at age 28 and continuing until age 73.  Hepatitis C blood  test.** / For all people born from [birth year] through 1965 and any individual with known risks for hepatitis C.  Abdominal aortic aneurysm (AAA) screening.** / A one-time screening for ages 50 to 27 years who are current or former smokers.  Skin self-exam. / Monthly.  Influenza vaccine. / Every year.  Tetanus, diphtheria, and acellular pertussis (Tdap/Td) vaccine.** / 1 dose of Td every 10 years.  Varicella vaccine.** / Consult your health care provider.  Zoster vaccine.** / 1 dose for adults aged 34 years or older.  Pneumococcal 13-valent conjugate (PCV13) vaccine.** / Consult your health care provider.  Pneumococcal polysaccharide (PPSV23) vaccine.** / 1 dose for all adults aged 63 years and older.  Meningococcal vaccine.** / Consult your health care provider.  Hepatitis A vaccine.** / Consult your health care provider.  Hepatitis B vaccine.** / Consult your health care provider.  Haemophilus influenzae type b (Hib) vaccine.** / Consult your health care provider. **Family history and personal history of risk and conditions may change your health care provider's recommendations. Document Released: 08/16/2001 Document Revised: 06/25/2013 Document Reviewed: 11/15/2010 [facility] Patient Information 2015 [place], [place]. This information is not intended to replace advice given to you by your health care provider. Make sure you discuss any questions you have with your health care provider.

## 2014-06-13 LAB — HEPATIC FUNCTION PANEL
ALK PHOS: 48 U/L (ref 39–117)
ALT: 33 U/L (ref 0–53)
AST: 29 U/L (ref 0–37)
Albumin: 4.7 g/dL (ref 3.5–5.2)
BILIRUBIN TOTAL: 0.8 mg/dL (ref 0.2–1.2)
Bilirubin, Direct: 0.1 mg/dL (ref 0.0–0.3)
Total Protein: 7.4 g/dL (ref 6.0–8.3)

## 2014-06-13 LAB — RENAL FUNCTION PANEL
Albumin: 4.7 g/dL (ref 3.5–5.2)
BUN: 16 mg/dL (ref 6–23)
CHLORIDE: 101 meq/L (ref 96–112)
CO2: 26 mEq/L (ref 19–32)
Calcium: 9.5 mg/dL (ref 8.4–10.5)
Creatinine, Ser: 0.8 mg/dL (ref 0.4–1.5)
GFR: 117.26 mL/min (ref >60.00)
GLUCOSE: 79 mg/dL (ref 70–99)
PHOSPHORUS: 3.5 mg/dL (ref 2.3–4.6)
Potassium: 4.2 mEq/L (ref 3.5–5.1)
Sodium: 135 mEq/L (ref 135–145)

## 2014-06-13 LAB — LIPID PANEL
Cholesterol: 284 mg/dL — ABNORMAL HIGH (ref 0–200)
HDL: 57.1 mg/dL (ref >39.00)
NonHDL: 226.9
Total CHOL/HDL Ratio: 5
Triglycerides: 272 mg/dL — ABNORMAL HIGH (ref 0.0–149.0)
VLDL: 54.4 mg/dL — ABNORMAL HIGH (ref 0.0–40.0)

## 2014-06-13 LAB — CBC
HEMATOCRIT: 46.6 % (ref 39.0–52.0)
Hemoglobin: 15.8 g/dL (ref 13.0–17.0)
MCHC: 33.9 g/dL (ref 30.0–36.0)
MCV: 95.3 fl (ref 78.0–100.0)
Platelets: 294 10*3/uL (ref 150.0–400.0)
RBC: 4.89 Mil/uL (ref 4.22–5.81)
RDW: 12.6 % (ref 11.5–15.5)
WBC: 6.7 10*3/uL (ref 4.0–10.5)

## 2014-06-13 LAB — TESTOSTERONE: Testosterone: 276.96 ng/dL — ABNORMAL LOW (ref 300.00–890.00)

## 2014-06-13 LAB — TSH: TSH: 1.12 u[IU]/mL (ref 0.35–4.50)

## 2014-06-16 ENCOUNTER — Encounter: Payer: Self-pay | Admitting: Family Medicine

## 2014-06-16 LAB — LDL CHOLESTEROL, DIRECT: Direct LDL: 196.3 mg/dL

## 2014-06-16 NOTE — Assessment & Plan Note (Signed)
Has been encouraged to proceed with surgery, is unable to proceed at this time, may proceed in future

## 2014-06-16 NOTE — Assessment & Plan Note (Signed)
No recent flares. Encouraged increased hydration, 64 ounces of clear fluids daily. Minimize alcohol and caffeine. Eat small frequent meals with lean proteins and complex carbs. Avoid high and low blood sugars. Get adequate sleep, 7-8 hours a night. Needs exercise daily preferably in the morning. 

## 2014-06-16 NOTE — Assessment & Plan Note (Signed)
Encouraged Zyrtec bid and daily Flonase and Singulair

## 2014-06-16 NOTE — Assessment & Plan Note (Signed)
Encouraged DASH diet, decrease po intake and increase exercise as tolerated. Needs 7-8 hours of sleep nightly. Avoid trans fats, eat small, frequent meals every 4-5 hours with lean proteins, complex carbs and healthy fats. Minimize simple carbs, GMO foods. 

## 2014-06-16 NOTE — Assessment & Plan Note (Signed)
Continue treatment, no shot in nearly 2 weeks. Recheck levels today

## 2014-06-16 NOTE — Assessment & Plan Note (Signed)
Encouraged heart healthy diet, increase exercise, avoid trans fats, consider a krill oil cap daily 

## 2014-06-16 NOTE — Assessment & Plan Note (Signed)
Patient encouraged to maintain heart healthy diet, regular exercise, adequate sleep. Consider daily probiotics. Take medications as prescribed. Declines flu shot 

## 2014-07-04 HISTORY — PX: NASAL SINUS SURGERY: SHX719

## 2014-10-22 ENCOUNTER — Other Ambulatory Visit: Payer: Self-pay

## 2014-10-22 DIAGNOSIS — E349 Endocrine disorder, unspecified: Secondary | ICD-10-CM

## 2014-10-22 MED ORDER — TESTOSTERONE CYPIONATE 200 MG/ML IM SOLN: 200.0000 mg | INTRAMUSCULAR | Status: DC

## 2014-10-22 NOTE — Telephone Encounter (Signed)
Awaiting for signature will be available at front desk after

## 2014-10-23 NOTE — Telephone Encounter (Signed)
PCP signed and faxed hardcopy to [facility]. #[phone] with 2 refills. Patient informed refill done.

## 2015-05-05 ENCOUNTER — Other Ambulatory Visit: Payer: Self-pay | Admitting: Family Medicine

## 2015-05-05 DIAGNOSIS — E349 Endocrine disorder, unspecified: Secondary | ICD-10-CM

## 2015-05-05 MED ORDER — TESTOSTERONE CYPIONATE 200 MG/ML IM SOLN: 200.0000 mg | INTRAMUSCULAR | Status: DC

## 2015-05-05 NOTE — Telephone Encounter (Signed)
Printed and on counter for signature.  Faxed hardcopy to [facility].

## 2015-05-05 NOTE — Telephone Encounter (Signed)
He can have one refill on the testosterone but warn him next month it will be a year since his labs and physical so he needs this before more can be prescribed. He should schedule for an office now to get it in time if possible

## 2015-06-30 ENCOUNTER — Encounter: Payer: Self-pay | Admitting: Family Medicine

## 2015-06-30 DIAGNOSIS — Z0000 Encounter for general adult medical examination without abnormal findings: Secondary | ICD-10-CM

## 2015-07-01 ENCOUNTER — Other Ambulatory Visit (INDEPENDENT_AMBULATORY_CARE_PROVIDER_SITE_OTHER): Payer: BLUE CROSS/BLUE SHIELD

## 2015-07-01 DIAGNOSIS — R7989 Other specified abnormal findings of blood chemistry: Secondary | ICD-10-CM | POA: Diagnosis not present

## 2015-07-01 DIAGNOSIS — Z0000 Encounter for general adult medical examination without abnormal findings: Secondary | ICD-10-CM | POA: Diagnosis not present

## 2015-07-01 LAB — COMPREHENSIVE METABOLIC PANEL
ALBUMIN: 4.6 g/dL (ref 3.5–5.2)
ALK PHOS: 54 U/L (ref 39–117)
ALT: 39 U/L (ref 0–53)
AST: 24 U/L (ref 0–37)
BUN: 18 mg/dL (ref 6–23)
CALCIUM: 10 mg/dL (ref 8.4–10.5)
CO2: 28 meq/L (ref 19–32)
CREATININE: 0.96 mg/dL (ref 0.40–1.50)
Chloride: 102 mEq/L (ref 96–112)
GFR: 90.47 mL/min (ref >60.00)
Glucose, Bld: 105 mg/dL — ABNORMAL HIGH (ref 70–99)
Potassium: 4.7 mEq/L (ref 3.5–5.1)
Sodium: 139 mEq/L (ref 135–145)
TOTAL PROTEIN: 7.5 g/dL (ref 6.0–8.3)
Total Bilirubin: 0.6 mg/dL (ref 0.2–1.2)

## 2015-07-01 LAB — CBC WITH DIFFERENTIAL/PLATELET
BASOS ABS: 0 10*3/uL (ref 0.0–0.1)
Basophils Relative: 0.5 % (ref 0.0–3.0)
EOS ABS: 0.4 10*3/uL (ref 0.0–0.7)
Eosinophils Relative: 5.1 % — ABNORMAL HIGH (ref 0.0–5.0)
HEMATOCRIT: 49.6 % (ref 39.0–52.0)
HEMOGLOBIN: 16.8 g/dL (ref 13.0–17.0)
LYMPHS PCT: 22.3 % (ref 12.0–46.0)
Lymphs Abs: 1.9 10*3/uL (ref 0.7–4.0)
MCHC: 33.8 g/dL (ref 30.0–36.0)
MCV: 97.3 fl (ref 78.0–100.0)
MONOS PCT: 7.1 % (ref 3.0–12.0)
Monocytes Absolute: 0.6 10*3/uL (ref 0.1–1.0)
Neutro Abs: 5.4 10*3/uL (ref 1.4–7.7)
Neutrophils Relative %: 65 % (ref 43.0–77.0)
PLATELETS: 285 10*3/uL (ref 150.0–400.0)
RBC: 5.1 Mil/uL (ref 4.22–5.81)
RDW: 12.7 % (ref 11.5–15.5)
WBC: 8.3 10*3/uL (ref 4.0–10.5)

## 2015-07-01 LAB — LIPID PANEL
CHOL/HDL RATIO: 6
CHOLESTEROL: 360 mg/dL — AB (ref 0–200)
HDL: 57.7 mg/dL (ref >39.00)
NonHDL: 302.27
Triglycerides: 393 mg/dL — ABNORMAL HIGH (ref 0.0–149.0)
VLDL: 78.6 mg/dL — ABNORMAL HIGH (ref 0.0–40.0)

## 2015-07-01 LAB — PSA: PSA: 0.74 ng/mL (ref 0.10–4.00)

## 2015-07-01 LAB — LDL CHOLESTEROL, DIRECT: Direct LDL: 242 mg/dL

## 2015-07-01 LAB — TSH: TSH: 1.13 u[IU]/mL (ref 0.35–4.50)

## 2015-07-01 NOTE — Telephone Encounter (Signed)
Labs ordered/lab appt. Made per mychart request.  Provider covering for PCP (Dr. Birdie Riddle) approved. Patient aware.

## 2015-07-02 ENCOUNTER — Other Ambulatory Visit: Payer: Self-pay | Admitting: General Practice

## 2015-07-02 DIAGNOSIS — E785 Hyperlipidemia, unspecified: Secondary | ICD-10-CM

## 2015-07-02 MED ORDER — ROSUVASTATIN CALCIUM 20 MG PO TABS
20.0000 mg | ORAL_TABLET | Freq: Every day | ORAL | Status: DC
Start: 2015-07-02 — End: 2015-07-14

## 2015-07-03 ENCOUNTER — Encounter: Payer: Self-pay | Admitting: Family Medicine

## 2015-07-10 ENCOUNTER — Telehealth: Payer: Self-pay | Admitting: Family Medicine

## 2015-07-10 ENCOUNTER — Encounter: Payer: Self-pay | Admitting: Family Medicine

## 2015-07-10 ENCOUNTER — Other Ambulatory Visit (INDEPENDENT_AMBULATORY_CARE_PROVIDER_SITE_OTHER): Payer: BLUE CROSS/BLUE SHIELD

## 2015-07-10 DIAGNOSIS — E349 Endocrine disorder, unspecified: Secondary | ICD-10-CM

## 2015-07-10 DIAGNOSIS — E291 Testicular hypofunction: Secondary | ICD-10-CM

## 2015-07-10 NOTE — Telephone Encounter (Signed)
Testosterone lab entered and lab appt. Made for today

## 2015-07-12 LAB — TESTOSTERONE, FREE, TOTAL, SHBG
Sex Hormone Binding: 19 nmol/L (ref 10–50)
TESTOSTERONE FREE: 44.3 pg/mL — AB (ref 47.0–244.0)
Testosterone-% Free: 2.5 % (ref 1.6–2.9)
Testosterone: 177 ng/dL — ABNORMAL LOW (ref 300–890)

## 2015-07-13 ENCOUNTER — Other Ambulatory Visit: Payer: Self-pay | Admitting: Family Medicine

## 2015-07-13 DIAGNOSIS — E349 Endocrine disorder, unspecified: Secondary | ICD-10-CM

## 2015-07-13 MED ORDER — TESTOSTERONE CYPIONATE 200 MG/ML IM SOLN: 200.0000 mg | INTRAMUSCULAR | Status: DC

## 2015-07-13 MED FILL — TESTOSTERONE CYP 200 MG/ML: 200 | 28 days supply | Qty: 2 | Fill #0

## 2015-07-13 NOTE — Telephone Encounter (Signed)
Printed testosteron script and faxed into [facility].

## 2015-07-14 ENCOUNTER — Other Ambulatory Visit: Payer: Self-pay | Admitting: Family Medicine

## 2015-07-14 MED ORDER — ROSUVASTATIN CALCIUM 40 MG PO TABS: 40.0000 mg | ORAL_TABLET | Freq: Every day | ORAL | Status: DC

## 2015-07-14 MED FILL — ROSUVASTATIN CALCIUM 40 MG: 40 | 30 days supply | Qty: 30 | Fill #0

## 2015-07-17 MED FILL — HYDROCODON-APAP 5-325: 5-325 | 10 days supply | Qty: 40 | Fill #0

## 2015-08-06 MED FILL — HYDROCODON-APAP 5-325: 5-325 | 30 days supply | Qty: 60 | Fill #0

## 2015-08-06 MED FILL — MELOXICAM 15 MG TABLET: 15 | 30 days supply | Qty: 30 | Fill #0

## 2015-09-04 MED FILL — TESTOSTERONE CYP 200 MG/ML: 200 | 28 days supply | Qty: 2 | Fill #1

## 2015-09-04 MED FILL — ROSUVASTATIN CALCIUM 40 MG: 40 | 30 days supply | Qty: 30 | Fill #1

## 2015-09-08 MED FILL — HYDROCODON-APAP 5-325: 5-325 | 30 days supply | Qty: 60 | Fill #0

## 2015-10-08 MED FILL — TESTOSTERONE CYP 200 MG/ML: 200 | 28 days supply | Qty: 2 | Fill #2

## 2015-10-12 MED FILL — HYDROCODON-APAP 5-325: 5-325 | 30 days supply | Qty: 60 | Fill #0

## 2015-10-23 ENCOUNTER — Other Ambulatory Visit: Payer: Self-pay | Admitting: Family Medicine

## 2015-10-23 ENCOUNTER — Encounter: Payer: Self-pay | Admitting: Family Medicine

## 2015-10-23 DIAGNOSIS — E785 Hyperlipidemia, unspecified: Secondary | ICD-10-CM

## 2015-10-23 DIAGNOSIS — E291 Testicular hypofunction: Secondary | ICD-10-CM

## 2015-10-23 MED ORDER — ROSUVASTATIN CALCIUM 40 MG PO TABS: 40.0000 mg | ORAL_TABLET | Freq: Every day | ORAL | Status: DC

## 2015-10-23 NOTE — Telephone Encounter (Signed)
Can be reached: [phone] Pharmacy: [facility]  Reason for call: Pt insurance requiring mail order pharmacy. Cannot fill the last fill at retail pharmacy or has to pay $56 for 30 days. Pt scheduled appt for 11/27/15. Can he get meds til then or can come in for lab if needed. - pt has been out for a while  PLEASE REPLY VIA MYCHART MSG TO PT

## 2015-11-06 MED FILL — TESTOSTERONE CYP 200 MG/ML: 200 | 28 days supply | Qty: 2 | Fill #3

## 2015-11-11 MED FILL — HYDROCODON-APAP 5-325: 5-325 | 30 days supply | Qty: 60 | Fill #0

## 2015-11-24 ENCOUNTER — Other Ambulatory Visit (INDEPENDENT_AMBULATORY_CARE_PROVIDER_SITE_OTHER): Payer: BLUE CROSS/BLUE SHIELD

## 2015-11-24 DIAGNOSIS — E291 Testicular hypofunction: Secondary | ICD-10-CM

## 2015-11-24 DIAGNOSIS — E785 Hyperlipidemia, unspecified: Secondary | ICD-10-CM | POA: Diagnosis not present

## 2015-11-24 LAB — COMPREHENSIVE METABOLIC PANEL
ALBUMIN: 4.7 g/dL (ref 3.5–5.2)
ALK PHOS: 40 U/L (ref 39–117)
ALT: 33 U/L (ref 0–53)
AST: 35 U/L (ref 0–37)
BUN: 14 mg/dL (ref 6–23)
CALCIUM: 9.6 mg/dL (ref 8.4–10.5)
CO2: 29 mEq/L (ref 19–32)
Chloride: 104 mEq/L (ref 96–112)
Creatinine, Ser: 0.82 mg/dL (ref 0.40–1.50)
GFR: 108.32 mL/min (ref >60.00)
GLUCOSE: 91 mg/dL (ref 70–99)
POTASSIUM: 4.1 meq/L (ref 3.5–5.1)
Sodium: 139 mEq/L (ref 135–145)
TOTAL PROTEIN: 7.1 g/dL (ref 6.0–8.3)
Total Bilirubin: 0.7 mg/dL (ref 0.2–1.2)

## 2015-11-24 LAB — CBC WITH DIFFERENTIAL/PLATELET
Basophils Absolute: 0.1 10*3/uL (ref 0.0–0.1)
Basophils Relative: 1.1 % (ref 0.0–3.0)
EOS PCT: 9.3 % — AB (ref 0.0–5.0)
Eosinophils Absolute: 0.6 10*3/uL (ref 0.0–0.7)
HEMATOCRIT: 48.9 % (ref 39.0–52.0)
Hemoglobin: 16.7 g/dL (ref 13.0–17.0)
LYMPHS ABS: 1.8 10*3/uL (ref 0.7–4.0)
Lymphocytes Relative: 26.2 % (ref 12.0–46.0)
MCHC: 34.2 g/dL (ref 30.0–36.0)
MCV: 95.4 fl (ref 78.0–100.0)
MONOS PCT: 7 % (ref 3.0–12.0)
Monocytes Absolute: 0.5 10*3/uL (ref 0.1–1.0)
NEUTROS ABS: 3.9 10*3/uL (ref 1.4–7.7)
NEUTROS PCT: 56.4 % (ref 43.0–77.0)
Platelets: 293 10*3/uL (ref 150.0–400.0)
RBC: 5.13 Mil/uL (ref 4.22–5.81)
RDW: 12.8 % (ref 11.5–15.5)
WBC: 7 10*3/uL (ref 4.0–10.5)

## 2015-11-24 LAB — LIPID PANEL
Cholesterol: 277 mg/dL — ABNORMAL HIGH (ref 0–200)
HDL: 47.3 mg/dL (ref >39.00)
LDL Cholesterol: 191 mg/dL — ABNORMAL HIGH (ref 0–99)
NONHDL: 229.35
TRIGLYCERIDES: 190 mg/dL — AB (ref 0.0–149.0)
Total CHOL/HDL Ratio: 6
VLDL: 38 mg/dL (ref 0.0–40.0)

## 2015-11-24 LAB — TESTOSTERONE: Testosterone: 726.06 ng/dL (ref 300.00–890.00)

## 2015-11-24 LAB — TSH: TSH: 1.21 u[IU]/mL (ref 0.35–4.50)

## 2015-11-26 ENCOUNTER — Other Ambulatory Visit: Payer: Self-pay | Admitting: Family Medicine

## 2015-11-26 MED ORDER — FENOFIBRATE MICRONIZED 130 MG PO CAPS: 130.0000 mg | ORAL_CAPSULE | Freq: Every day | ORAL | Status: DC

## 2015-11-26 MED FILL — FENOFIBRATE 130 MG CAPSULE: 130 | 30 days supply | Qty: 30 | Fill #0

## 2015-11-27 ENCOUNTER — Ambulatory Visit: Payer: BLUE CROSS/BLUE SHIELD | Admitting: Family Medicine

## 2015-12-07 ENCOUNTER — Other Ambulatory Visit: Payer: Self-pay | Admitting: Family Medicine

## 2015-12-07 MED ORDER — ROSUVASTATIN CALCIUM 40 MG PO TABS: 40.0000 mg | ORAL_TABLET | Freq: Every day | ORAL | Status: DC

## 2015-12-11 MED FILL — HYDROCODON-APAP 5-325: 5-325 | 30 days supply | Qty: 60 | Fill #0

## 2015-12-22 MED FILL — TESTOSTERONE CYP 200 MG/ML: 200 | 28 days supply | Qty: 2 | Fill #4

## 2015-12-29 ENCOUNTER — Ambulatory Visit: Payer: BLUE CROSS/BLUE SHIELD | Admitting: Family Medicine

## 2016-01-04 ENCOUNTER — Encounter: Payer: Self-pay | Admitting: Family Medicine

## 2016-01-07 MED FILL — HYDROCODON-APAP 5-325: 5-325 | 10 days supply | Qty: 30 | Fill #0

## 2016-01-12 ENCOUNTER — Other Ambulatory Visit: Payer: Self-pay | Admitting: Orthopedic Surgery

## 2016-01-19 ENCOUNTER — Other Ambulatory Visit: Payer: Self-pay | Admitting: *Deleted

## 2016-01-19 NOTE — Telephone Encounter (Signed)
Called and spoke with the pt and informed him that we received a refill request for the Testosterone Cyp 200 mg/ml.  Informed him that Dr. Charlett Blake is out of the office and we will not be able to refill the prescription without her approval.   Informed him that he has not been seen for an office visit since (2015), so I am unable to refill the prescription.  Pt stated that he has had the blood work done for the testosterone.  Informed him that Dr. Charlett Blake still will need to approve the refill.  Pt verbalized understanding and agreed.//AB/CMA

## 2016-01-21 MED FILL — HYDROCODON-APAP 5-325: 5-325 | 30 days supply | Qty: 60 | Fill #0

## 2016-02-08 ENCOUNTER — Encounter (HOSPITAL_COMMUNITY)
Admission: RE | Admit: 2016-02-08 | Discharge: 2016-02-08 | Disposition: A | Discharge status: Home / Self Care | Payer: BLUE CROSS/BLUE SHIELD | Source: Ambulatory Visit | Attending: Orthopedic Surgery | Admitting: Orthopedic Surgery

## 2016-02-08 ENCOUNTER — Encounter (HOSPITAL_COMMUNITY): Payer: Self-pay

## 2016-02-08 ENCOUNTER — Ambulatory Visit (HOSPITAL_COMMUNITY)
Admission: RE | Admit: 2016-02-08 | Discharge: 2016-02-08 | Disposition: A | Discharge status: Home / Self Care | Payer: BLUE CROSS/BLUE SHIELD | Source: Ambulatory Visit | Attending: Orthopedic Surgery | Admitting: Orthopedic Surgery

## 2016-02-08 DIAGNOSIS — Z01818 Encounter for other preprocedural examination: Secondary | ICD-10-CM | POA: Insufficient documentation

## 2016-02-08 DIAGNOSIS — Z01812 Encounter for preprocedural laboratory examination: Secondary | ICD-10-CM | POA: Insufficient documentation

## 2016-02-08 HISTORY — DX: Unspecified osteoarthritis, unspecified site: M19.90

## 2016-02-08 LAB — CBC WITH DIFFERENTIAL/PLATELET
BASOS PCT: 1 %
Basophils Absolute: 0.1 10*3/uL (ref 0.0–0.1)
EOS ABS: 0.5 10*3/uL (ref 0.0–0.7)
EOS PCT: 7 %
HCT: 48.9 % (ref 39.0–52.0)
Hemoglobin: 17.1 g/dL — ABNORMAL HIGH (ref 13.0–17.0)
LYMPHS ABS: 2.3 10*3/uL (ref 0.7–4.0)
Lymphocytes Relative: 32 %
MCH: 32.8 pg (ref 26.0–34.0)
MCHC: 35 g/dL (ref 30.0–36.0)
MCV: 93.7 fL (ref 78.0–100.0)
MONO ABS: 0.6 10*3/uL (ref 0.1–1.0)
MONOS PCT: 8 %
NEUTROS PCT: 52 %
Neutro Abs: 3.8 10*3/uL (ref 1.7–7.7)
Platelets: 293 10*3/uL (ref 150–400)
RBC: 5.22 MIL/uL (ref 4.22–5.81)
RDW: 12.1 % (ref 11.5–15.5)
WBC: 7.2 10*3/uL (ref 4.0–10.5)

## 2016-02-08 LAB — TYPE AND SCREEN
ABO/RH(D): A POS
Antibody Screen: NEGATIVE

## 2016-02-08 LAB — URINALYSIS, ROUTINE W REFLEX MICROSCOPIC
Bilirubin Urine: NEGATIVE
Glucose, UA: NEGATIVE mg/dL
Hgb urine dipstick: NEGATIVE
KETONES UR: NEGATIVE mg/dL
LEUKOCYTES UA: NEGATIVE
NITRITE: NEGATIVE
PROTEIN: NEGATIVE mg/dL
Specific Gravity, Urine: 1.03 (ref 1.005–1.030)
pH: 5.5 (ref 5.0–8.0)

## 2016-02-08 LAB — COMPREHENSIVE METABOLIC PANEL
ALBUMIN: 4.6 g/dL (ref 3.5–5.0)
ALK PHOS: 50 U/L (ref 38–126)
ALT: 41 U/L (ref 17–63)
ANION GAP: 10 (ref 5–15)
AST: 36 U/L (ref 15–41)
BUN: 16 mg/dL (ref 6–20)
CO2: 23 mmol/L (ref 22–32)
CREATININE: 0.87 mg/dL (ref 0.61–1.24)
Calcium: 9.5 mg/dL (ref 8.9–10.3)
Chloride: 106 mmol/L (ref 101–111)
GFR calc non Af Amer: >60 mL/min (ref >60)
GLUCOSE: 94 mg/dL (ref 65–99)
POTASSIUM: 4.1 mmol/L (ref 3.5–5.1)
SODIUM: 139 mmol/L (ref 135–145)
Total Bilirubin: 0.8 mg/dL (ref 0.3–1.2)
Total Protein: 7.4 g/dL (ref 6.5–8.1)

## 2016-02-08 LAB — PROTIME-INR
INR: 1.01
PROTHROMBIN TIME: 13.3 s (ref 11.4–15.2)

## 2016-02-08 LAB — ABO/RH: ABO/RH(D): A POS

## 2016-02-08 LAB — SURGICAL PCR SCREEN
MRSA, PCR: NEGATIVE
STAPHYLOCOCCUS AUREUS: NEGATIVE

## 2016-02-08 LAB — APTT: aPTT: 30 seconds (ref 24–36)

## 2016-02-08 NOTE — Pre-Procedure Instructions (Addendum)
Middleton Daquino  02/08/2016      [place], [address] [place] [address] Phone: [phone] Fax: [phone]  [place], [place] [address] Phone: [phone] Fax: [phone]    Your procedure is scheduled on 02/19/16  Report to [facility] Admitting at 530 A.M.  Call this number if you have problems the morning of surgery:  [phone]   Remember:  Do not eat food or drink liquids after midnight.  Take these medicines the morning of surgery with A SIP OF WATER hydrocodone if needed  STOP all herbel meds, nsaids (aleve,naproxen,advil,ibuprofen) 5 days prior to surgery starting 02/14/16 including vitamins,aspirin   Do not wear jewelry, make-up or nail polish.  Do not wear lotions, powders, or perfumes.  You may wear deoderant.  Do not shave 48 hours prior to surgery.  Men may shave face and neck.  Do not bring valuables to the hospital.  [facility] is not responsible for any belongings or valuables.  Contacts, dentures or bridgework may not be worn into surgery.  Leave your suitcase in the car.  After surgery it may be brought to your room.  For patients admitted to the hospital, discharge time will be determined by your treatment team.  Patients discharged the day of surgery will not be allowed to drive home.   Name and phone number of your driver:    Special instructions:  Special Instructions: [facility] - Preparing for Surgery  Before surgery, you can play an important role.  Because skin is not sterile, your skin needs to be as free of germs as possible.  You can reduce the number of germs on you skin by washing with CHG (chlorahexidine gluconate) soap before surgery.  CHG is an antiseptic cleaner which kills germs and bonds with the skin to continue killing germs even after  washing.  Please DO NOT use if you have an allergy to CHG or antibacterial soaps.  If your skin becomes reddened/irritated stop using the CHG and inform your nurse when you arrive at Short Stay.  Do not shave (including legs and underarms) for at least 48 hours prior to the first CHG shower.  You may shave your face.  Please follow these instructions carefully:   1.  Shower with CHG Soap the night before surgery and the morning of Surgery.  2.  If you choose to wash your hair, wash your hair first as usual with your normal shampoo.  3.  After you shampoo, rinse your hair and body thoroughly to remove the Shampoo.  4.  Use CHG as you would any other liquid soap.  You can apply chg directly  to the skin and wash gently with scrungie or a clean washcloth.  5.  Apply the CHG Soap to your body ONLY FROM THE NECK DOWN.  Do not use on open wounds or open sores.  Avoid contact with your eyes ears, mouth and genitals (private parts).  Wash genitals (private parts)       with your normal soap.  6.  Wash thoroughly, paying special attention to the area where your surgery will be performed.  7.  Thoroughly rinse your body with warm water from the neck down.  8.  DO NOT shower/wash with your normal soap after using and rinsing off the  CHG Soap.  9.  Pat yourself dry with a clean towel.            10.  Wear clean pajamas.            11.  Place clean sheets on your bed the night of your first shower and do not sleep with pets.  Day of Surgery  Do not apply any lotions/deodorants the morning of surgery.  Please wear clean clothes to the hospital/surgery center.  Please read over the fact sheets that you were given.

## 2016-02-10 ENCOUNTER — Telehealth: Payer: Self-pay | Admitting: Family Medicine

## 2016-02-10 NOTE — Telephone Encounter (Signed)
Could do a quick visit 8/11 at 4:15, not a lot of openings due to time off

## 2016-02-10 NOTE — Telephone Encounter (Signed)
Pt came in to see PCP. He says that he's been off of his testosterone for a few weeks. Pt also says that he will be having surgery 8/18 and would like to see PCP before surgery while he will still be able to get around. Could provider advise for scheduling?    Please advise for scheduling.

## 2016-02-11 NOTE — Telephone Encounter (Signed)
Left detailed vm for pt informing of pcp's note.

## 2016-02-18 NOTE — Anesthesia Preprocedure Evaluation (Addendum)
Anesthesia Evaluation  Patient identified by MRN, date of birth, ID band Patient awake    Reviewed: Allergy & Precautions, NPO status , Patient's Chart, lab work & pertinent test results  History of Anesthesia Complications Negative for: history of anesthetic complications  Airway Mallampati: II  TM Distance: >3 FB Neck ROM: Full    Dental  (+) Teeth Intact, Dental Advisory Given   Pulmonary neg shortness of breath, neg sleep apnea, neg COPD, neg recent URI, former smoker,    Pulmonary exam normal breath sounds clear to auscultation       Cardiovascular Exercise Tolerance: Good (-) hypertension(-) angina(-) Past MI, (-) Cardiac Stents, (-) CABG, (-) Orthopnea and (-) PND negative cardio ROS   Rhythm:Regular Rate:Normal     Neuro/Psych  Headaches, neg Seizures  Neuromuscular disease (radiculopathy of upper extremity)    GI/Hepatic negative GI ROS, Neg liver ROS, neg GERD  ,  Endo/Other  neg diabetesLow testosterone  Renal/GU negative Renal ROS     Musculoskeletal  (+) Arthritis ,   Abdominal (+) + obese,   Peds  Hematology negative hematology ROS (+)   Anesthesia Other Findings HLD  Reproductive/Obstetrics                            Anesthesia Physical Anesthesia Plan  ASA: II  Anesthesia Plan: Spinal and Regional   Post-op Pain Management:  Regional for Post-op pain   Induction: Intravenous  Airway Management Planned: Natural Airway and Nasal Cannula  Additional Equipment:   Intra-op Plan:   Post-operative Plan:   Informed Consent: I have reviewed the patients History and Physical, chart, labs and discussed the procedure including the risks, benefits and alternatives for the proposed anesthesia with the patient or authorized representative who has indicated his/her understanding and acceptance.   Dental advisory given  Plan Discussed with:   Anesthesia Plan Comments: (I  have discussed risks of neuraxial anesthesia including but not limited to infection, bleeding, nerve injury, back pain, headache, seizures, and failure of block. Patient denies bleeding disorders and is not currently anticoagulated. Labs have been reviewed. Risks and benefits discussed. All patient's questions answered.   Hgb 17.1 Platelets 293 INR 1.01  )       Anesthesia Quick Evaluation

## 2016-02-19 ENCOUNTER — Inpatient Hospital Stay (HOSPITAL_COMMUNITY): Payer: BLUE CROSS/BLUE SHIELD | Admitting: Anesthesiology

## 2016-02-19 ENCOUNTER — Inpatient Hospital Stay (HOSPITAL_COMMUNITY)
Admission: RE | Admit: 2016-02-19 | Discharge: 2016-02-20 | DRG: 470 | Disposition: A | Discharge status: Home / Self Care | Payer: BLUE CROSS/BLUE SHIELD | Source: Ambulatory Visit | Attending: Orthopedic Surgery | Admitting: Orthopedic Surgery

## 2016-02-19 ENCOUNTER — Encounter (HOSPITAL_COMMUNITY): Payer: Self-pay | Admitting: *Deleted

## 2016-02-19 ENCOUNTER — Encounter (HOSPITAL_COMMUNITY)
Admission: RE | Disposition: A | Discharge status: Home / Self Care | Payer: Self-pay | Source: Ambulatory Visit | Attending: Orthopedic Surgery

## 2016-02-19 DIAGNOSIS — Z87891 Personal history of nicotine dependence: Secondary | ICD-10-CM | POA: Diagnosis not present

## 2016-02-19 DIAGNOSIS — M1711 Unilateral primary osteoarthritis, right knee: Secondary | ICD-10-CM | POA: Diagnosis present

## 2016-02-19 DIAGNOSIS — Z888 Allergy status to other drugs, medicaments and biological substances status: Secondary | ICD-10-CM

## 2016-02-19 DIAGNOSIS — K219 Gastro-esophageal reflux disease without esophagitis: Secondary | ICD-10-CM | POA: Diagnosis present

## 2016-02-19 DIAGNOSIS — Z880 Allergy status to penicillin: Secondary | ICD-10-CM | POA: Diagnosis not present

## 2016-02-19 DIAGNOSIS — E669 Obesity, unspecified: Secondary | ICD-10-CM | POA: Diagnosis present

## 2016-02-19 HISTORY — PX: TOTAL KNEE ARTHROPLASTY: SHX125

## 2016-02-19 SURGERY — ARTHROPLASTY, KNEE, TOTAL
Anesthesia: Regional | Site: Knee | Laterality: Right

## 2016-02-19 MED ORDER — BUPIVACAINE HCL (PF) 0.5 % IJ SOLN
INTRAMUSCULAR | Status: DC | PRN
  Administered 2016-02-19: 20 mL

## 2016-02-19 MED ORDER — PROMETHAZINE HCL 25 MG/ML IJ SOLN: 6.2500 mg | INTRAMUSCULAR | Status: DC | PRN

## 2016-02-19 MED ORDER — PROPOFOL 10 MG/ML IV BOLUS
INTRAVENOUS | Status: DC | PRN
  Administered 2016-02-19: 30 mg via INTRAVENOUS

## 2016-02-19 MED ORDER — ACETAMINOPHEN 325 MG PO TABS
650.0000 mg | ORAL_TABLET | Freq: Four times a day (QID) | ORAL | Status: DC | PRN
Start: 2016-02-19 — End: 2016-02-20

## 2016-02-19 MED ORDER — MIDAZOLAM HCL 2 MG/2ML IJ SOLN
INTRAMUSCULAR | Status: AC
  Filled 2016-02-19: qty 2

## 2016-02-19 MED ORDER — OXYCODONE HCL 5 MG PO TABS
5.0000 mg | ORAL_TABLET | ORAL | Status: DC | PRN
  Administered 2016-02-19 – 2016-02-20 (×8): 10 mg via ORAL
  Filled 2016-02-19 (×7): qty 2

## 2016-02-19 MED ORDER — BUPIVACAINE LIPOSOME 1.3 % IJ SUSP
INTRAMUSCULAR | Status: DC | PRN
  Administered 2016-02-19: 20 mL

## 2016-02-19 MED ORDER — DIPHENHYDRAMINE HCL 12.5 MG/5ML PO ELIX: 12.5000 mg | ORAL_SOLUTION | ORAL | Status: DC | PRN

## 2016-02-19 MED ORDER — ASPIRIN EC 325 MG PO TBEC
325.0000 mg | DELAYED_RELEASE_TABLET | Freq: Two times a day (BID) | ORAL | Status: DC
  Administered 2016-02-19 – 2016-02-20 (×2): 325 mg via ORAL
  Filled 2016-02-19 (×2): qty 1

## 2016-02-19 MED ORDER — HYDROMORPHONE HCL 1 MG/ML IJ SOLN: 0.5000 mg | INTRAMUSCULAR | Status: DC | PRN

## 2016-02-19 MED ORDER — ONDANSETRON HCL 4 MG/2ML IJ SOLN: 4.0000 mg | Freq: Four times a day (QID) | INTRAMUSCULAR | Status: DC | PRN

## 2016-02-19 MED ORDER — LIDOCAINE 2% (20 MG/ML) 5 ML SYRINGE
INTRAMUSCULAR | Status: AC
  Filled 2016-02-19: qty 5

## 2016-02-19 MED ORDER — ACETAMINOPHEN 650 MG RE SUPP: 650.0000 mg | Freq: Four times a day (QID) | RECTAL | Status: DC | PRN

## 2016-02-19 MED ORDER — LIDOCAINE HCL (CARDIAC) 20 MG/ML IV SOLN
INTRAVENOUS | Status: DC | PRN
  Administered 2016-02-19: 40 mg via INTRAVENOUS

## 2016-02-19 MED ORDER — 0.9 % SODIUM CHLORIDE (POUR BTL) OPTIME
TOPICAL | Status: DC | PRN
  Administered 2016-02-19: 1000 mL

## 2016-02-19 MED ORDER — BUPIVACAINE HCL (PF) 0.5 % IJ SOLN
INTRAMUSCULAR | Status: AC
Start: 2016-02-19 — End: 2016-02-19
  Filled 2016-02-19: qty 30

## 2016-02-19 MED ORDER — BUPIVACAINE-EPINEPHRINE (PF) 0.5% -1:200000 IJ SOLN
INTRAMUSCULAR | Status: DC | PRN
  Administered 2016-02-19: 30 mL via PERINEURAL

## 2016-02-19 MED ORDER — CHLORHEXIDINE GLUCONATE 4 % EX LIQD: 60.0000 mL | Freq: Once | CUTANEOUS | Status: DC

## 2016-02-19 MED ORDER — ROSUVASTATIN CALCIUM 10 MG PO TABS
40.0000 mg | ORAL_TABLET | Freq: Every day | ORAL | Status: DC
  Administered 2016-02-19: 40 mg via ORAL
  Filled 2016-02-19 (×2): qty 4

## 2016-02-19 MED ORDER — TRANEXAMIC ACID 1000 MG/10ML IV SOLN
1000.0000 mg | INTRAVENOUS | Status: AC
  Administered 2016-02-19: 1000 mg via INTRAVENOUS
  Filled 2016-02-19: qty 10

## 2016-02-19 MED ORDER — DEXAMETHASONE SODIUM PHOSPHATE 10 MG/ML IJ SOLN
10.0000 mg | Freq: Two times a day (BID) | INTRAMUSCULAR | Status: AC
  Administered 2016-02-19 (×2): 10 mg via INTRAVENOUS
  Filled 2016-02-19 (×2): qty 1

## 2016-02-19 MED ORDER — FENTANYL CITRATE (PF) 100 MCG/2ML IJ SOLN
INTRAMUSCULAR | Status: AC
Start: 2016-02-19 — End: 2016-02-19
  Filled 2016-02-19: qty 4

## 2016-02-19 MED ORDER — ONDANSETRON HCL 4 MG/2ML IJ SOLN
INTRAMUSCULAR | Status: DC | PRN
  Administered 2016-02-19: 4 mg via INTRAVENOUS

## 2016-02-19 MED ORDER — ALUM & MAG HYDROXIDE-SIMETH 200-200-20 MG/5ML PO SUSP
30.0000 mL | ORAL | Status: DC | PRN
  Administered 2016-02-19: 30 mL via ORAL
  Filled 2016-02-19 (×2): qty 30

## 2016-02-19 MED ORDER — DOCUSATE SODIUM 100 MG PO CAPS
100.0000 mg | ORAL_CAPSULE | Freq: Two times a day (BID) | ORAL | Status: DC
  Administered 2016-02-19 – 2016-02-20 (×2): 100 mg via ORAL
  Filled 2016-02-19 (×2): qty 1

## 2016-02-19 MED ORDER — POLYETHYLENE GLYCOL 3350 17 G PO PACK: 17.0000 g | PACK | Freq: Every day | ORAL | Status: DC | PRN

## 2016-02-19 MED ORDER — PROPOFOL 1000 MG/100ML IV EMUL
INTRAVENOUS | Status: AC
  Filled 2016-02-19: qty 300

## 2016-02-19 MED ORDER — PHENYLEPHRINE 40 MCG/ML (10ML) SYRINGE FOR IV PUSH (FOR BLOOD PRESSURE SUPPORT)
PREFILLED_SYRINGE | INTRAVENOUS | Status: AC
  Filled 2016-02-19: qty 10

## 2016-02-19 MED ORDER — FENOFIBRATE 160 MG PO TABS
160.0000 mg | ORAL_TABLET | Freq: Every day | ORAL | Status: DC
  Administered 2016-02-19 – 2016-02-20 (×2): 160 mg via ORAL
  Filled 2016-02-19 (×2): qty 1

## 2016-02-19 MED ORDER — BUPIVACAINE LIPOSOME 1.3 % IJ SUSP
20.0000 mL | INTRAMUSCULAR | Status: DC
  Filled 2016-02-19: qty 20

## 2016-02-19 MED ORDER — BUPIVACAINE HCL (PF) 0.5 % IJ SOLN
INTRAMUSCULAR | Status: DC | PRN
  Administered 2016-02-19: 3 mL via INTRATHECAL

## 2016-02-19 MED ORDER — METHOCARBAMOL 750 MG PO TABS: 750.0000 mg | ORAL_TABLET | Freq: Three times a day (TID) | ORAL | 0 refills | Status: DC | PRN

## 2016-02-19 MED ORDER — ASPIRIN EC 325 MG PO TBEC: 325.0000 mg | DELAYED_RELEASE_TABLET | Freq: Two times a day (BID) | ORAL | 0 refills | Status: DC

## 2016-02-19 MED ORDER — SODIUM CHLORIDE 0.9 % IV SOLN
INTRAVENOUS | Status: DC
  Administered 2016-02-19 – 2016-02-20 (×2): via INTRAVENOUS

## 2016-02-19 MED ORDER — METHOCARBAMOL 1000 MG/10ML IJ SOLN
500.0000 mg | Freq: Four times a day (QID) | INTRAVENOUS | Status: DC | PRN
  Filled 2016-02-19: qty 5

## 2016-02-19 MED ORDER — PROPOFOL 500 MG/50ML IV EMUL
INTRAVENOUS | Status: DC | PRN
  Administered 2016-02-19: 125 ug/kg/min via INTRAVENOUS

## 2016-02-19 MED ORDER — MIDAZOLAM HCL 5 MG/5ML IJ SOLN
INTRAMUSCULAR | Status: DC | PRN
  Administered 2016-02-19: 1 mg via INTRAVENOUS
  Administered 2016-02-19: 2 mg via INTRAVENOUS
  Administered 2016-02-19: 1 mg via INTRAVENOUS

## 2016-02-19 MED ORDER — CLINDAMYCIN PHOSPHATE 900 MG/50ML IV SOLN
900.0000 mg | INTRAVENOUS | Status: AC
  Administered 2016-02-19: 900 mg via INTRAVENOUS
  Filled 2016-02-19: qty 50

## 2016-02-19 MED ORDER — LACTATED RINGERS IV SOLN
INTRAVENOUS | Status: DC | PRN
  Administered 2016-02-19 (×2): via INTRAVENOUS

## 2016-02-19 MED ORDER — CLINDAMYCIN PHOSPHATE 600 MG/50ML IV SOLN
600.0000 mg | Freq: Four times a day (QID) | INTRAVENOUS | Status: AC
  Administered 2016-02-19 (×2): 600 mg via INTRAVENOUS
  Filled 2016-02-19 (×2): qty 50

## 2016-02-19 MED ORDER — ONDANSETRON HCL 4 MG PO TABS: 4.0000 mg | ORAL_TABLET | Freq: Four times a day (QID) | ORAL | Status: DC | PRN

## 2016-02-19 MED ORDER — FENTANYL CITRATE (PF) 100 MCG/2ML IJ SOLN
INTRAMUSCULAR | Status: DC | PRN
  Administered 2016-02-19 (×2): 50 ug via INTRAVENOUS

## 2016-02-19 MED ORDER — METHOCARBAMOL 1000 MG/10ML IJ SOLN
500.0000 mg | INTRAVENOUS | Status: DC
  Filled 2016-02-19: qty 5

## 2016-02-19 MED ORDER — METHOCARBAMOL 500 MG PO TABS
500.0000 mg | ORAL_TABLET | Freq: Four times a day (QID) | ORAL | Status: DC | PRN
  Administered 2016-02-20 (×3): 500 mg via ORAL
  Filled 2016-02-19 (×3): qty 1

## 2016-02-19 MED ORDER — OXYCODONE-ACETAMINOPHEN 5-325 MG PO TABS: 1.0000 | ORAL_TABLET | ORAL | 0 refills | Status: DC | PRN

## 2016-02-19 MED ORDER — PANTOPRAZOLE SODIUM 40 MG PO TBEC
40.0000 mg | DELAYED_RELEASE_TABLET | Freq: Once | ORAL | Status: AC
  Administered 2016-02-19: 40 mg via ORAL
  Filled 2016-02-19: qty 1

## 2016-02-19 MED ORDER — PHENYLEPHRINE HCL 10 MG/ML IJ SOLN
INTRAMUSCULAR | Status: DC | PRN
Start: 2016-02-19 — End: 2016-02-19
  Administered 2016-02-19: 80 ug via INTRAVENOUS
  Administered 2016-02-19: 40 ug via INTRAVENOUS
  Administered 2016-02-19 (×2): 80 ug via INTRAVENOUS

## 2016-02-19 MED ORDER — FENTANYL CITRATE (PF) 100 MCG/2ML IJ SOLN: 25.0000 ug | INTRAMUSCULAR | Status: DC | PRN

## 2016-02-19 MED ORDER — ONDANSETRON HCL 4 MG/2ML IJ SOLN
INTRAMUSCULAR | Status: AC
  Filled 2016-02-19: qty 2

## 2016-02-19 MED ORDER — TRANEXAMIC ACID 1000 MG/10ML IV SOLN
1000.0000 mg | Freq: Once | INTRAVENOUS | Status: AC
  Administered 2016-02-19: 1000 mg via INTRAVENOUS
  Filled 2016-02-19: qty 10

## 2016-02-19 MED ORDER — BISACODYL 5 MG PO TBEC: 5.0000 mg | DELAYED_RELEASE_TABLET | Freq: Every day | ORAL | Status: DC | PRN

## 2016-02-19 MED ORDER — MAGNESIUM CITRATE PO SOLN: 1.0000 | Freq: Once | ORAL | Status: DC | PRN

## 2016-02-19 MED ORDER — KETOROLAC TROMETHAMINE 15 MG/ML IJ SOLN
15.0000 mg | Freq: Three times a day (TID) | INTRAMUSCULAR | Status: AC
  Administered 2016-02-19 – 2016-02-20 (×4): 15 mg via INTRAVENOUS
  Filled 2016-02-19 (×4): qty 1

## 2016-02-19 MED ORDER — OXYCODONE HCL 5 MG PO TABS
ORAL_TABLET | ORAL | Status: AC
  Filled 2016-02-19: qty 2

## 2016-02-19 MED ORDER — PROPOFOL 10 MG/ML IV BOLUS
INTRAVENOUS | Status: AC
  Filled 2016-02-19: qty 20

## 2016-02-19 SURGICAL SUPPLY — 61 items
BANDAGE ESMARK 6X9 LF (GAUZE/BANDAGES/DRESSINGS) ×1 IMPLANT
BENZOIN TINCTURE PRP APPL 2/3 (GAUZE/BANDAGES/DRESSINGS) ×3 IMPLANT
BLADE SAGITTAL 25.0X1.19X90 (BLADE) ×2 IMPLANT
BLADE SAGITTAL 25.0X1.19X90MM (BLADE) ×1
BLADE SAW SAG 90X13X1.27 (BLADE) ×3 IMPLANT
BNDG ESMARK 6X9 LF (GAUZE/BANDAGES/DRESSINGS) ×3
BOWL SMART MIX CTS (DISPOSABLE) ×3 IMPLANT
CAPT KNEE TOTAL 3 ATTUNE ×3 IMPLANT
CEMENT HV SMART SET (Cement) ×6 IMPLANT
CLOSURE WOUND 1/2 X4 (GAUZE/BANDAGES/DRESSINGS) ×2
COVER SURGICAL LIGHT HANDLE (MISCELLANEOUS) ×3 IMPLANT
CUFF TOURNIQUET SINGLE 34IN LL (TOURNIQUET CUFF) ×3 IMPLANT
DRAPE EXTREMITY T 121X128X90 (DRAPE) ×3 IMPLANT
DRAPE IMP U-DRAPE 54X76 (DRAPES) ×3 IMPLANT
DRAPE U-SHAPE 47X51 STRL (DRAPES) ×3 IMPLANT
DRSG AQUACEL AG ADV 3.5X10 (GAUZE/BANDAGES/DRESSINGS) ×3 IMPLANT
DRSG MEPILEX BORDER 4X12 (GAUZE/BANDAGES/DRESSINGS) ×3 IMPLANT
DRSG PAD ABDOMINAL 8X10 ST (GAUZE/BANDAGES/DRESSINGS) ×3 IMPLANT
DURAPREP 26ML APPLICATOR (WOUND CARE) ×3 IMPLANT
ELECT REM PT RETURN 9FT ADLT (ELECTROSURGICAL) ×3
ELECTRODE REM PT RTRN 9FT ADLT (ELECTROSURGICAL) ×1 IMPLANT
EVACUATOR 1/8 PVC DRAIN (DRAIN) ×3 IMPLANT
FACESHIELD WRAPAROUND (MASK) ×3 IMPLANT
GAUZE SPONGE 4X4 12PLY STRL (GAUZE/BANDAGES/DRESSINGS) ×3 IMPLANT
GLOVE BIOGEL PI IND STRL 8 (GLOVE) ×2 IMPLANT
GLOVE BIOGEL PI INDICATOR 8 (GLOVE) ×4
GLOVE ECLIPSE 7.5 STRL STRAW (GLOVE) ×6 IMPLANT
GOWN STRL REUS W/ TWL LRG LVL3 (GOWN DISPOSABLE) ×1 IMPLANT
GOWN STRL REUS W/ TWL XL LVL3 (GOWN DISPOSABLE) ×2 IMPLANT
GOWN STRL REUS W/TWL LRG LVL3 (GOWN DISPOSABLE) ×2
GOWN STRL REUS W/TWL XL LVL3 (GOWN DISPOSABLE) ×4
HANDPIECE INTERPULSE COAX TIP (DISPOSABLE) ×2
HOOD PEEL AWAY FACE SHEILD DIS (HOOD) ×6 IMPLANT
IMMOBILIZER KNEE 20 (SOFTGOODS) IMPLANT
IMMOBILIZER KNEE 22 UNIV (SOFTGOODS) ×3 IMPLANT
KIT BASIN OR (CUSTOM PROCEDURE TRAY) ×3 IMPLANT
KIT ROOM TURNOVER OR (KITS) ×3 IMPLANT
MANIFOLD NEPTUNE II (INSTRUMENTS) ×3 IMPLANT
NEEDLE SPNL 22GX3.5 QUINCKE BK (NEEDLE) ×3 IMPLANT
NS IRRIG 1000ML POUR BTL (IV SOLUTION) ×3 IMPLANT
PACK TOTAL JOINT (CUSTOM PROCEDURE TRAY) ×3 IMPLANT
PACK UNIVERSAL I (CUSTOM PROCEDURE TRAY) ×3 IMPLANT
PAD ARMBOARD 7.5X6 YLW CONV (MISCELLANEOUS) ×6 IMPLANT
PAD CAST 4YDX4 CTTN HI CHSV (CAST SUPPLIES) ×1 IMPLANT
PADDING CAST COTTON 4X4 STRL (CAST SUPPLIES) ×2
SET HNDPC FAN SPRY TIP SCT (DISPOSABLE) ×1 IMPLANT
STAPLER VISISTAT 35W (STAPLE) IMPLANT
STRIP CLOSURE SKIN 1/2X4 (GAUZE/BANDAGES/DRESSINGS) ×4 IMPLANT
SUCTION FRAZIER HANDLE 10FR (MISCELLANEOUS) ×2
SUCTION TUBE FRAZIER 10FR DISP (MISCELLANEOUS) ×1 IMPLANT
SUT MNCRL AB 3-0 PS2 18 (SUTURE) IMPLANT
SUT VIC AB 0 CTB1 27 (SUTURE) ×6 IMPLANT
SUT VIC AB 1 CT1 27 (SUTURE) ×4
SUT VIC AB 1 CT1 27XBRD ANBCTR (SUTURE) ×2 IMPLANT
SUT VIC AB 2-0 CTB1 (SUTURE) ×6 IMPLANT
SYR 50ML LL SCALE MARK (SYRINGE) ×3 IMPLANT
TOWEL OR 17X24 6PK STRL BLUE (TOWEL DISPOSABLE) ×3 IMPLANT
TOWEL OR 17X26 10 PK STRL BLUE (TOWEL DISPOSABLE) ×3 IMPLANT
TRAY CATH 16FR W/PLASTIC CATH (SET/KITS/TRAYS/PACK) ×3 IMPLANT
TRAY FOLEY CATH 16FRSI W/METER (SET/KITS/TRAYS/PACK) IMPLANT
WRAP KNEE MAXI GEL POST OP (GAUZE/BANDAGES/DRESSINGS) ×3 IMPLANT

## 2016-02-19 NOTE — Anesthesia Procedure Notes (Signed)
Spinal  Patient location during procedure: OR Start time: 02/19/2016 7:47 AM End time: 02/19/2016 7:50 AM Staffing Anesthesiologist: Nilda Simmer Performed: anesthesiologist  Preanesthetic Checklist Completed: patient identified, site marked, surgical consent, pre-op evaluation, timeout performed, IV checked, risks and benefits discussed and monitors and equipment checked Spinal Block Patient position: sitting Location: L3-4 Needle Needle type: Pencan  Needle gauge: 24 G Needle length: 9 cm Needle insertion depth: 5 cm Assessment Events: paresthesia and transient

## 2016-02-19 NOTE — Discharge Instructions (Signed)

## 2016-02-19 NOTE — Anesthesia Procedure Notes (Signed)
Anesthesia Regional Block:  Adductor canal block  Pre-Anesthetic Checklist: ,, timeout performed, Correct Patient, Correct Site, Correct Laterality, Correct Procedure, Correct Position, site marked, Risks and benefits discussed,  Surgical consent,  Pre-op evaluation,  At surgeon's request and post-op pain management  Laterality: Right  Prep: chloraprep       Needles:  Injection technique: Single-shot  Needle Type: Echogenic Stimulator Needle     Needle Length: 9cm 9 cm Needle Gauge: 21 and 21 G    Additional Needles:  Procedures: ultrasound guided (picture in chart) Adductor canal block Narrative:  Start time: 02/19/2016 7:03 AM End time: 02/19/2016 7:05 AM Injection made incrementally with aspirations every 5 mL.  Performed by: Personally  Anesthesiologist: Nilda Simmer

## 2016-02-19 NOTE — Brief Op Note (Signed)
02/19/2016  10:07 AM  PATIENT:  Anthony Reese  45 y.o. male  PRE-OPERATIVE DIAGNOSIS:  Osteoarthritis right knee  POST-OPERATIVE DIAGNOSIS:  Osteoarthritis right knee  PROCEDURE:  Procedure(s): RIGHT TOTAL KNEE ARTHROPLASTY (Right)  SURGEON:  Surgeon(s) and Role:    * Dorna Leitz, MD - Primary  PHYSICIAN ASSISTANT:   ASSISTANTS: bethune   ANESTHESIA:   spinal  EBL:  Total I/O In: 1000 [I.V.:1000] Out: -   BLOOD ADMINISTERED:none  DRAINS: none   LOCAL MEDICATIONS USED:  MARCAINE    and OTHER experel  SPECIMEN:  No Specimen  DISPOSITION OF SPECIMEN:  N/A  COUNTS:  YES  TOURNIQUET:  * Missing tourniquet times found for documented tourniquets in log:  [id number] *  DICTATION: .Other Dictation: Dictation Number none given  PLAN OF CARE: Admit to inpatient   PATIENT DISPOSITION:  PACU - hemodynamically stable.   Delay start of Pharmacological VTE agent (>24hrs) due to surgical blood loss or risk of bleeding: no

## 2016-02-19 NOTE — Evaluation (Signed)
Physical Therapy Evaluation Patient Details Name: Anthony Reese MRN: [id number] DOB: 12-24-1970 Today's Date: 02/19/2016   History of Present Illness  Pt presents s/p R TKR, no signficant PMH.   Clinical Impression  Pain increased throughout session and RN in to administered medication at the end of session.  PT provided pt and family education for mobility and activity at d/c, and initiated transfer gait training.     Follow Up Recommendations Home health PT;Supervision for mobility/OOB    Equipment Recommendations  Other (comment) (pt has needed DME)    Recommendations for Other Services       Precautions / Restrictions Precautions Precautions: Knee Required Braces or Orthoses: Knee Immobilizer - Right Restrictions Weight Bearing Restrictions: Yes RLE Weight Bearing: Weight bearing as tolerated      Mobility  Bed Mobility Overal bed mobility: Needs Assistance Bed Mobility: Supine to Sit     Supine to sit: Min assist (to move RLE to EOB and lower to floor)        Transfers Overall transfer level: Needs assistance Equipment used: Rolling walker (2 wheeled) Transfers: Sit to/from Stand Sit to Stand: Mod assist         General transfer comment: lifting assistance to stand, verbal cues for hand placement and forward gaze  Ambulation/Gait Ambulation/Gait assistance: Min guard Ambulation Distance (Feet): 40 Feet Assistive device: Rolling walker (2 wheeled) Gait Pattern/deviations: Step-to pattern;Decreased step length - left;Decreased stride length;Decreased weight shift to right;Antalgic Gait velocity: decreased      Stairs            Wheelchair Mobility    Modified Rankin (Stroke Patients Only)       Balance Overall balance assessment: Needs assistance Sitting-balance support: No upper extremity supported Sitting balance-Leahy Scale: Good     Standing balance support: No upper extremity supported Standing balance-Leahy Scale: Fair                                Pertinent Vitals/Pain Pain Assessment: 0-10 Pain Score: 9  Pain Location: R knee Pain Descriptors / Indicators: Aching Pain Intervention(s): Limited activity within patient's tolerance;Monitored during session;Repositioned;RN gave pain meds during session    Home Living Family/patient expects to be discharged to:: Private residence Living Arrangements: Spouse/significant other Available Help at Discharge: Family;Available 24 hours/day Type of Home: House Home Access: Stairs to enter;Level entry   Entrance Stairs-Number of Steps: 10+ steps to enter through front, level entry through basement Home Layout: Multi-level;Other (Comment) (able to stay in basement (full bath) until able to do steps) Home Equipment: Walker - 2 wheels;Bedside commode      Prior Function Level of Independence: Independent               Hand Dominance        Extremity/Trunk Assessment   Upper Extremity Assessment: Defer to OT evaluation           Lower Extremity Assessment: RLE deficits/detail RLE Deficits / Details: sensation intact, decreased AROM 2/2 pain (~5-70 degrees), strength WFL for mobility    Cervical / Trunk Assessment: Normal  Communication   Communication: No difficulties  Cognition Arousal/Alertness: Awake/alert Behavior During Therapy: WFL for tasks assessed/performed Overall Cognitive Status: Within Functional Limits for tasks assessed                      General Comments      Exercises  Assessment/Plan    PT Assessment Patient needs continued PT services  PT Diagnosis Abnormality of gait   PT Problem List Decreased strength;Decreased range of motion;Decreased activity tolerance;Decreased balance;Decreased mobility;Decreased coordination;Decreased knowledge of use of DME  PT Treatment Interventions DME instruction;Gait training;Stair training;Functional mobility training;Therapeutic activities;Therapeutic  exercise;Balance training;Neuromuscular re-education;Patient/family education   PT Goals (Current goals can be found in the Care Plan section) Acute Rehab PT Goals Patient Stated Goal: to get his knee better than it was before PT Goal Formulation: With patient Time For Goal Achievement: 02/26/16 Potential to Achieve Goals: Good    Frequency 7X/week   Barriers to discharge Inaccessible home environment      Co-evaluation               End of Session Equipment Utilized During Treatment: Gait belt;Right knee immobilizer Activity Tolerance: Patient tolerated treatment well Patient left: in chair;with call bell/phone within reach;with family/visitor present Nurse Communication: Mobility status;Patient requests pain meds         Time: 1415-1453 PT Time Calculation (min) (ACUTE ONLY): 38 min   Charges:     PT Treatments $Gait Training: 8-22 mins $Therapeutic Activity: 8-22 mins   PT G Codes:        Renada Cronin E Penven-Crew, PT, DPT 02/19/2016, 3:03 PM

## 2016-02-19 NOTE — Op Note (Signed)
NAMEMarland Kitchen  Anthony Reese, Anthony Reese               ACCOUNT NO.:  [id number]  MEDICAL RECORD NO.:  [id number]  LOCATION:  [id number]                        FACILITY:  [facility]  PHYSICIAN:  Alta Corning, M.D.   DATE OF BIRTH:  Jan 03, 1971  DATE OF PROCEDURE:  02/19/2016 DATE OF DISCHARGE:                              OPERATIVE REPORT   PREOPERATIVE DIAGNOSIS:  End-stage degenerative joint disease, right knee.  POSTOPERATIVE DIAGNOSIS:  End-stage degenerative joint disease, right knee.  PROCEDURE:  Right total knee replacement with Attune system size 6 tibia, size 6 femur, 35-mm all poly patella, and a size 7 mobile bearing.  SURGEON:  Alta Corning, M.D.  ASSISTANT:  Gary Fleet, P.A.  ANESTHESIA:  General.  BRIEF HISTORY:  Anthony Reese is a 44 year old male with a long history of significant complaints of right knee pain.  He had a previous ACL reconstruction.  He has had previous arthroscopy and meniscectomy in the past.  He had done well up until recently when he began having increasing pain.  X-rays showed bone-on-bone change.  He was having night pain and light activity pain.  After failure of all conservative care, he was taken to the operating room for right total knee replacement and screw removal from his ACL as necessary.  DESCRIPTION OF PROCEDURE:  The patient was taken to the operating room. After adequate level of anesthesia was obtained with general anesthetic, the patient was placed supine on the operating room table.  The right knee was then prepped and draped in usual sterile fashion.  Following this, the leg was exsanguinated.  Blood pressure tourniquet was inflated to 300 mmHg.  Following this, a midline incision was made in the subcutaneous tissue down the level of the extensor mechanism and a medial parapatellar arthrotomy was undertaken.  Medial and lateral meniscus were removed, retropatellar fat pad, synovium on the anterior aspect of the femur, and anterior and  posterior cruciates.  Once this was done, an intramedullary pilot hole was drilled in the femur and a distal resection was undertaken.  Rotational alignment was then assessed and anterior-posterior cuts were made, chamfers and box.  Once this was completed, attention was turned to the tibia, where the tibia was cut perpendicular to its long axis with an extramedullary alignment guide. Once this was done, retractors were put in place.  The tibia was drilled and keeled, and at this point, the trial components were put in place. Size 6 tibia, size 6 femur, a 7-mm bridging bearing was placed. Attention was turned to the patella, it was cut down to a level of 14 mm and the 35 paddle was drilled and once this was completed, the knee was put through a range of motion.  Excellent stability was achieved through a range of motion at this point.  Once this was completed, attention was turned towards removal of the trial components.  The knee was then copiously and thoroughly lavaged, pulsatile lavage irrigation, suction dried, and then the final components were cemented in place, size 6 femur, size 6 tibia, 7-mm bridging bearing trial was placed and a 35 all poly patella was placed.  Once this was completed, the knee was put through a range  of motion.  Excellent stability, and range of motion was achieved and the tourniquet was let down with cements all harden and all excess bone cement had been removed.  Once that was done, the final poly was placed, knee put through a range of motion, 60 mL of 20 mL of Exparel, 20 mL of Marcaine, 20 mL of saline were instilled all throughout the synovial reflection and throughout the case.  It give excellent postoperative control.  At this point, the medial parapatellar arthrotomy was closed with 1 Vicryl running, skin with 0 and 2-0 Vicryl and 3-0 Monocryl for subcuticular.  Benzoin and Steri-Strips were applied.  Sterile compressive dressing was applied, and the  patient was taken to the recovery, was noted to be in satisfactory condition. Estimated blood loss for the procedure is minimal.     Alta Corning, M.D.     Corliss Skains  D:  02/19/2016  T:  02/19/2016  Job:  [id number]

## 2016-02-19 NOTE — H&P (Signed)
TOTAL KNEE ADMISSION H&P  Patient is being admitted for right total knee arthroplasty.  Subjective:  Chief Complaint:right knee pain.  HPI: [name], 45 y.o. male, has a history of pain and functional disability in the right knee due to arthritis and has failed non-surgical conservative treatments for greater than 12 weeks to includeNSAID's and/or analgesics, corticosteriod injections, viscosupplementation injections and activity modification.  Onset of symptoms was gradual, starting 5 years ago with gradually worsening course since that time. The patient noted prior procedures on the knee to include  arthroscopy, menisectomy and ACL reconstruction on the right knee(s).  Patient currently rates pain in the right knee(s) at 9 out of 10 with activity. Patient has night pain, worsening of pain with activity and weight bearing, pain that interferes with activities of daily living, joint swelling and severe pain.  Patient has evidence of subchondral cysts, subchondral sclerosis, periarticular osteophytes and joint space narrowing by imaging studies. This patient has had failure of all reasonable conservative care. There is no active infection.  Patient Active Problem List   Diagnosis Date Noted  . Hyperlipidemia 11/18/2013  . Contact dermatitis 11/18/2013  . Allergic state 11/18/2013  . Nasal polyp 11/18/2013  . Preventative health care 11/18/2013  . Injury of left shoulder 01/30/2013  . Radiculopathy affecting upper extremity 11/20/2011  . H/O tobacco use, presenting hazards to health 07/31/2011  . Headache, cluster, episodic   . Testosterone deficiency   . Overweight    Past Medical History:  Diagnosis Date  . Arthritis   . Chicken pox   . H/O tobacco use, presenting hazards to health 07/31/2011  . Headache, cluster, episodic    hx over a yr  . Injury of left shoulder 01/30/2013  . Low testosterone   . Nasal polyp 11/18/2013  . Other and unspecified hyperlipidemia 11/18/2013  .  Overweight(278.02)   . Preventative health care 11/18/2013  . Radiculopathy affecting upper extremity 11/20/2011  . Tobacco use disorder 07/31/2011    Past Surgical History:  Procedure Laterality Date  . APPENDECTOMY    . BICEPS TENDON REPAIR  2012   right  . CARPAL TUNNEL RELEASE  2013   right  . KNEE SURGERY Right 2013   acl  . NASAL SINUS SURGERY  2016    No prescriptions prior to admission.   Allergies  Allergen Reactions  . Imitrex [Sumatriptan]     Swelling around eyes and SOb  . Penicillins     Has patient had a PCN reaction causing immediate rash, facial/tongue/throat swelling, SOB or lightheadedness with hypotension: {unknown Has patient had a PCN reaction causing severe rash involving mucus membranes or skin necrosis: {unknown Has patient had a PCN reaction that required hospitalization {unknwon Has patient had a PCN reaction occurring within the last 10 years: no If all of the above answers are "NO", then may proceed with Cephalosporin use.    Social History  Substance Use Topics  . Smoking status: Former Smoker    Packs/day: 0.25    Years: 3.00    Types: Cigarettes    Quit date: 01/31/2012  . Smokeless tobacco: Never Used  . Alcohol use 8.4 oz/week    14 Cans of beer per week    Family History  Problem Relation Age of Onset  . Cancer Mother     leukemia, pancreatic cancer  . Cancer Maternal Aunt     lung  . Cancer Maternal Grandfather     colon   . Cholelithiasis Sister  ROS ROS: I have reviewed the patient's review of systems thoroughly and there are no positive responses as relates to the HPI. Objective:  Physical Exam  Vital signs in last 24 hours:   Well-developed well-nourished patient in no acute distress. Alert and oriented x3 HEENT:within normal limits Cardiac: Regular rate and rhythm Pulmonary: Lungs clear to auscultation Abdomen: Soft and nontender.  Normal active bowel sounds  Musculoskeletal: (right knee: Painful range of  motion.  Limited range of motion.  No instability.  1+ effusion.  Neurovascularly intact distally. Labs:  Recent Results (from the past 2160 hour(s))  Lipid panel     Status: Abnormal   Collection Time: 11/24/15 12:46 PM  Result Value Ref Range   Cholesterol 277 (H) 0 - 200 mg/dL    Comment: ATP III Classification       Desirable:  < 200 mg/dL               Borderline High:  200 - 239 mg/dL          High:  > = 240 mg/dL   Triglycerides 190.0 (H) 0.0 - 149.0 mg/dL    Comment: Normal:  <150 mg/dLBorderline High:  150 - 199 mg/dL   HDL 47.30 >39.00 mg/dL   VLDL 38.0 0.0 - 40.0 mg/dL   LDL Cholesterol 191 (H) 0 - 99 mg/dL   Total CHOL/HDL Ratio 6     Comment:                Men          Women1/2 Average Risk     3.4          3.3Average Risk          5.0          4.42X Average Risk          9.6          7.13X Average Risk          15.0          11.0                       NonHDL 229.35     Comment: NOTE:  Non-HDL goal should be 30 mg/dL higher than patient's LDL goal (i.e. LDL goal of < 70 mg/dL, would have non-HDL goal of < 100 mg/dL)  Comprehensive metabolic panel     Status: None   Collection Time: 11/24/15 12:46 PM  Result Value Ref Range   Sodium 139 135 - 145 mEq/L   Potassium 4.1 3.5 - 5.1 mEq/L   Chloride 104 96 - 112 mEq/L   CO2 29 19 - 32 mEq/L   Glucose, Bld 91 70 - 99 mg/dL   BUN 14 6 - 23 mg/dL   Creatinine, Ser 0.82 0.40 - 1.50 mg/dL   Total Bilirubin 0.7 0.2 - 1.2 mg/dL   Alkaline Phosphatase 40 39 - 117 U/L   AST 35 0 - 37 U/L   ALT 33 0 - 53 U/L   Total Protein 7.1 6.0 - 8.3 g/dL   Albumin 4.7 3.5 - 5.2 g/dL   Calcium 9.6 8.4 - 10.5 mg/dL   GFR 108.32 >60.00 mL/min  CBC with Differential/Platelet     Status: Abnormal   Collection Time: 11/24/15 12:46 PM  Result Value Ref Range   WBC 7.0 4.0 - 10.5 K/uL   RBC 5.13 4.22 - 5.81 Mil/uL   Hemoglobin 16.7 13.0 - 17.0 g/dL  HCT 48.9 39.0 - 52.0 %   MCV 95.4 78.0 - 100.0 fl   MCHC 34.2 30.0 - 36.0 g/dL   RDW 12.8 11.5  - 15.5 %   Platelets 293.0 150.0 - 400.0 K/uL   Neutrophils Relative % 56.4 43.0 - 77.0 %   Lymphocytes Relative 26.2 12.0 - 46.0 %   Monocytes Relative 7.0 3.0 - 12.0 %   Eosinophils Relative 9.3 (H) 0.0 - 5.0 %   Basophils Relative 1.1 0.0 - 3.0 %   Neutro Abs 3.9 1.4 - 7.7 K/uL   Lymphs Abs 1.8 0.7 - 4.0 K/uL   Monocytes Absolute 0.5 0.1 - 1.0 K/uL   Eosinophils Absolute 0.6 0.0 - 0.7 K/uL   Basophils Absolute 0.1 0.0 - 0.1 K/uL  TSH     Status: None   Collection Time: 11/24/15 12:46 PM  Result Value Ref Range   TSH 1.21 0.35 - 4.50 uIU/mL  Testosterone     Status: None   Collection Time: 11/24/15 12:46 PM  Result Value Ref Range   Testosterone 726.06 300.00 - 890.00 ng/dL  Surgical pcr screen     Status: None   Collection Time: 02/08/16  3:13 PM  Result Value Ref Range   MRSA, PCR NEGATIVE NEGATIVE   Staphylococcus aureus NEGATIVE NEGATIVE    Comment:        The Xpert SA Assay (FDA approved for NASAL specimens in patients over 54 years of age), is one component of a comprehensive surveillance program.  Test performance has been validated by [facility] for patients greater than or equal to 71 year old. It is not intended to diagnose infection nor to guide or monitor treatment.   APTT     Status: None   Collection Time: 02/08/16  3:13 PM  Result Value Ref Range   aPTT 30 24 - 36 seconds  CBC WITH DIFFERENTIAL     Status: Abnormal   Collection Time: 02/08/16  3:13 PM  Result Value Ref Range   WBC 7.2 4.0 - 10.5 K/uL   RBC 5.22 4.22 - 5.81 MIL/uL   Hemoglobin 17.1 (H) 13.0 - 17.0 g/dL   HCT 48.9 39.0 - 52.0 %   MCV 93.7 78.0 - 100.0 fL   MCH 32.8 26.0 - 34.0 pg   MCHC 35.0 30.0 - 36.0 g/dL   RDW 12.1 11.5 - 15.5 %   Platelets 293 150 - 400 K/uL   Neutrophils Relative % 52 %   Neutro Abs 3.8 1.7 - 7.7 K/uL   Lymphocytes Relative 32 %   Lymphs Abs 2.3 0.7 - 4.0 K/uL   Monocytes Relative 8 %   Monocytes Absolute 0.6 0.1 - 1.0 K/uL   Eosinophils Relative 7 %    Eosinophils Absolute 0.5 0.0 - 0.7 K/uL   Basophils Relative 1 %   Basophils Absolute 0.1 0.0 - 0.1 K/uL  Comprehensive metabolic panel     Status: None   Collection Time: 02/08/16  3:13 PM  Result Value Ref Range   Sodium 139 135 - 145 mmol/L   Potassium 4.1 3.5 - 5.1 mmol/L   Chloride 106 101 - 111 mmol/L   CO2 23 22 - 32 mmol/L   Glucose, Bld 94 65 - 99 mg/dL   BUN 16 6 - 20 mg/dL   Creatinine, Ser 0.87 0.61 - 1.24 mg/dL   Calcium 9.5 8.9 - 10.3 mg/dL   Total Protein 7.4 6.5 - 8.1 g/dL   Albumin 4.6 3.5 - 5.0 g/dL  AST 36 15 - 41 U/L   ALT 41 17 - 63 U/L   Alkaline Phosphatase 50 38 - 126 U/L   Total Bilirubin 0.8 0.3 - 1.2 mg/dL   GFR calc non Af Amer >60 >60 mL/min   GFR calc Af Amer >60 >60 mL/min    Comment: (NOTE) The eGFR has been calculated using the CKD EPI equation. This calculation has not been validated in all clinical situations. eGFR's persistently <60 mL/min signify possible Chronic Kidney Disease.    Anion gap 10 5 - 15  Protime-INR     Status: None   Collection Time: 02/08/16  3:13 PM  Result Value Ref Range   Prothrombin Time 13.3 11.4 - 15.2 seconds   INR 1.01   Urinalysis, Routine w reflex microscopic (not at [facility])     Status: None   Collection Time: 02/08/16  3:13 PM  Result Value Ref Range   Color, Urine YELLOW YELLOW   APPearance CLEAR CLEAR   Specific Gravity, Urine 1.030 1.005 - 1.030   pH 5.5 5.0 - 8.0   Glucose, UA NEGATIVE NEGATIVE mg/dL   Hgb urine dipstick NEGATIVE NEGATIVE   Bilirubin Urine NEGATIVE NEGATIVE   Ketones, ur NEGATIVE NEGATIVE mg/dL   Protein, ur NEGATIVE NEGATIVE mg/dL   Nitrite NEGATIVE NEGATIVE   Leukocytes, UA NEGATIVE NEGATIVE    Comment: MICROSCOPIC NOT DONE ON URINES WITH NEGATIVE PROTEIN, BLOOD, LEUKOCYTES, NITRITE, OR GLUCOSE <1000 mg/dL.  Type and screen Order type and screen if day of surgery is less than 15 days from draw of preadmission visit or order morning of surgery if day of surgery is greater than 6  days from preadmission visit.     Status: None   Collection Time: 02/08/16  3:15 PM  Result Value Ref Range   ABO/RH(D) A POS    Antibody Screen NEG    Sample Expiration 02/22/2016    Extend sample reason NO TRANSFUSIONS OR PREGNANCY IN THE PAST 3 MONTHS   ABO/Rh     Status: None   Collection Time: 02/08/16  3:15 PM  Result Value Ref Range   ABO/RH(D) A POS    Estimated body mass index is 32.78 kg/m as calculated from the following:   Height as of 02/08/16: '5\' 8"'$  (1.727 m).   Weight as of 02/08/16: 97.8 kg (215 lb 9.6 oz).   Imaging Review Plain radiographs demonstrate severe degenerative joint disease of the right knee(s). The overall alignment ismild valgus. The bone quality appears to be good for age and reported activity level.  Assessment/Plan:  End stage arthritis, right knee   The patient history, physical examination, clinical judgment of the provider and imaging studies are consistent with end stage degenerative joint disease of the right knee(s) and total knee arthroplasty is deemed medically necessary. The treatment options including medical management, injection therapy arthroscopy and arthroplasty were discussed at length. The risks and benefits of total knee arthroplasty were presented and reviewed. The risks due to aseptic loosening, infection, stiffness, patella tracking problems, thromboembolic complications and other imponderables were discussed. The patient acknowledged the explanation, agreed to proceed with the plan and consent was signed. Patient is being admitted for inpatient treatment for surgery, pain control, PT, OT, prophylactic antibiotics, VTE prophylaxis, progressive ambulation and ADL's and discharge planning. The patient is planning to be discharged home with home health services

## 2016-02-19 NOTE — Anesthesia Postprocedure Evaluation (Signed)
Anesthesia Post Note  Patient: Anthony Reese  Procedure(s) Performed: Procedure(s) (LRB): RIGHT TOTAL KNEE ARTHROPLASTY (Right)  Patient location during evaluation: PACU Anesthesia Type: General Level of consciousness: awake and alert Pain management: pain level controlled Vital Signs Assessment: post-procedure vital signs reviewed and stable Respiratory status: spontaneous breathing, nonlabored ventilation, respiratory function stable and patient connected to nasal cannula oxygen Cardiovascular status: blood pressure returned to baseline and stable Postop Assessment: no signs of nausea or vomiting Anesthetic complications: no    Last Vitals:  Vitals:   02/19/16 1234 02/19/16 1257  BP: (!) 137/96 (!) 141/89  Pulse: 64 67  Resp: 17 18  Temp:  36.3 C    Last Pain:  Vitals:   02/19/16 1600  TempSrc:   PainSc: 10-Worst pain ever                 Nilda Simmer

## 2016-02-19 NOTE — Transfer of Care (Signed)
Immediate Anesthesia Transfer of Care Note  Patient: Anthony Reese  Procedure(s) Performed: Procedure(s): RIGHT TOTAL KNEE ARTHROPLASTY (Right)  Patient Location: PACU  Anesthesia Type:Spinal  Level of Consciousness:  sedated, patient cooperative and responds to stimulation  Airway & Oxygen Therapy:Patient Spontanous Breathing and Patient connected to face mask oxgen  Post-op Assessment:  Report given to PACU RN and Post -op Vital signs reviewed and stable  Post vital signs:  Reviewed and stable  Last Vitals:  Vitals:   02/19/16 0557  BP: (!) 136/91  Pulse: 71  Resp: 20  Temp: [id number] C    Complications: No apparent anesthesia complications

## 2016-02-19 NOTE — Progress Notes (Signed)
Orthopedic Tech Progress Note Patient Details:  Anthony Reese [birth year] [id number]  CPM Right Knee CPM Right Knee: On Right Knee Flexion (Degrees): 90 Right Knee Extension (Degrees): 0 Additional Comments: Trapeze bar    [place] Pink 02/19/2016, 10:49 AM

## 2016-02-20 LAB — CBC
HEMATOCRIT: 39.7 % (ref 39.0–52.0)
Hemoglobin: 13.5 g/dL (ref 13.0–17.0)
MCH: 32.2 pg (ref 26.0–34.0)
MCHC: 34 g/dL (ref 30.0–36.0)
MCV: 94.7 fL (ref 78.0–100.0)
Platelets: 228 10*3/uL (ref 150–400)
RBC: 4.19 MIL/uL — ABNORMAL LOW (ref 4.22–5.81)
RDW: 11.9 % (ref 11.5–15.5)
WBC: 11.5 10*3/uL — ABNORMAL HIGH (ref 4.0–10.5)

## 2016-02-20 NOTE — Progress Notes (Signed)
Patient discharged to home, discharge instructions given, patient stated he understood

## 2016-02-20 NOTE — Progress Notes (Signed)
Orthopedic Tech Progress Note Patient Details:  Anthony Reese 1971-06-19 [id number]  Patient ID: [name], male   DOB: 1970/07/07, 45 y.o.   MRN: [id number] Pt already in cpm.  Karolee Stamps 02/20/2016, 6:04 AM

## 2016-02-20 NOTE — Care Management Note (Signed)
Case Management Note  Patient Details  Name: Anthony Reese MRN: [id number] Date of Birth: June 23, 1971  Subjective/Objective: 45 yo M s/p R TKR                    Action/Plan: received referral to assist pt with [facility] needs   Expected Discharge Date: 02/20/16                 Expected Discharge Plan:  [place]  In-House Referral:     Discharge planning Services  CM Consult  Post Acute Care Choice:    Choice offered to:  Patient  DME Arranged:    DME Agency:     HH Arranged:  PT [place]:  [place]  Status of Service:  Completed, signed off  If discussed at [place] of Stay Meetings, dates discussed:    Additional Comments: met with pt at bedside. D/C plan is to return home with the support of his wife who works from home and his son. DME delivered: RW, 3-in-1 BSC, and a CPM.  Provided pt  with a list of [place] agencies. He prefers to use [facility]. Contacted [name] at [facility] for referral.   [name], RN 02/20/2016, 11:50 AM

## 2016-02-20 NOTE — Progress Notes (Signed)
Subjective: 1 Day Post-Op Procedure(s) (LRB): RIGHT TOTAL KNEE ARTHROPLASTY (Right) Patient reports pain as moderate. Taking by mouth and voiding okay.  Positive flatus.   Objective: Vital signs in last 24 hours: Temp:  [97.3 F (36.3 C)-98.9 F (37.2 C)] 97.6 F (36.4 C) (08/19 0436) Pulse Rate:  [52-91] 80 (08/19 0436) Resp:  [14-18] 18 (08/19 0436) BP: (106-141)/(67-99) 126/76 (08/19 0436) SpO2:  [95 %-100 %] 95 % (08/19 0436)  Intake/Output from previous day: 08/18 0701 - 08/19 0700 In: 3621.7 [P.O.:600; I.V.:2971.7; IV Piggyback:50] Out: 475 [Urine:400; Blood:75] Intake/Output this shift: No intake/output data recorded.   Recent Labs  02/20/16 0416  HGB 13.5    Recent Labs  02/20/16 0416  WBC 11.5*  RBC 4.19*  HCT 39.7  PLT 228   No results for input(s): NA, K, CL, CO2, BUN, CREATININE, GLUCOSE, CALCIUM in the last 72 hours. No results for input(s): LABPT, INR in the last 72 hours. Right knee exam: Neurovascular intact Sensation intact distally Intact pulses distally Dorsiflexion/Plantar flexion intact Incision: dressing C/D/I Compartment soft  Assessment/Plan: 1 Day Post-Op Procedure(s) (LRB): RIGHT TOTAL KNEE ARTHROPLASTY (Right)  Plan:  Up with therapy Discharge home with home health  Follow-up with Dr. Berenice Primas in 2 weeks. Aspirin 325 mg twice daily for DVT prophylaxis 1 month postop. Rx for Percocet and Robaxin.  Shell Yandow G 02/20/2016, 9:46 AM

## 2016-02-20 NOTE — Progress Notes (Signed)
Physical Therapy Treatment Patient Details Name: Anthony Reese MRN: [id number] DOB: 1970/11/03 Today's Date: 02/20/2016    History of Present Illness Pt presents s/p R TKR, no signficant PMH.     PT Comments    Pt presented supine in bed with HOB elevated, R LE in CPM, awake and willing to participate in therapy session. Pt again participating in stair training, this time utilizing L railing and ascending/descending a full flight of stairs. Pt would now be safe to enter his home from either entrance and navigate within his home as well. Pt would continue to benefit from skilled physical therapy services at this time while admitted and after d/c to address his limitations in order to improve his overall safety and independence with functional mobility.    Follow Up Recommendations  Home health PT;Supervision for mobility/OOB     Equipment Recommendations  Other (comment) (pt has all DME needs at home)    Recommendations for Other Services       Precautions / Restrictions Precautions Precautions: Knee Precaution Comments: PT discussed positioning of R LE following TKA, emphasized no pillow under his knee. Required Braces or Orthoses: Knee Immobilizer - Right Knee Immobilizer - Right: On when out of bed or walking Restrictions Weight Bearing Restrictions: Yes RLE Weight Bearing: Weight bearing as tolerated    Mobility  Bed Mobility Overal bed mobility: Needs Assistance Bed Mobility: Supine to Sit     Supine to sit: Supervision;HOB elevated     General bed mobility comments: pt required increased time  Transfers Overall transfer level: Needs assistance Equipment used: Rolling walker (2 wheeled) Transfers: Sit to/from Stand Sit to Stand: Supervision         General transfer comment: pt required increased time  Ambulation/Gait Ambulation/Gait assistance: Supervision Ambulation Distance (Feet): 100 Feet Assistive device: Rolling walker (2 wheeled) Gait  Pattern/deviations: Step-through pattern;Decreased step length - left;Decreased stance time - right;Decreased weight shift to right Gait velocity: decreased Gait velocity interpretation: Below normal speed for age/gender General Gait Details: pt responded well to VC'ing for improved gait pattern and able to progress from step-to to step-through pattern   Stairs Stairs: Yes Stairs assistance: Min guard Stair Management: One rail Left Number of Stairs: 13 General stair comments: pt ascended with L LE leading and descended with R LE leading; min A with stabilization of RW  Wheelchair Mobility    Modified Rankin (Stroke Patients Only)       Balance Overall balance assessment: Needs assistance Sitting-balance support: Feet supported;No upper extremity supported Sitting balance-Leahy Scale: Good     Standing balance support: During functional activity;Bilateral upper extremity supported Standing balance-Leahy Scale: Fair                      Cognition Arousal/Alertness: Awake/alert Behavior During Therapy: WFL for tasks assessed/performed Overall Cognitive Status: Within Functional Limits for tasks assessed                      Exercises Total Joint Exercises Ankle Circles/Pumps: AROM;Strengthening;Both;10 reps;Seated Quad Sets: AROM;Strengthening;Right;5 reps;Seated Goniometric ROM: Flexion: 85 degrees, Extension: lacking 10 degrees to neutral; measured in sitting    General Comments        Pertinent Vitals/Pain Pain Assessment: 0-10 Pain Score: 5  Pain Location: R knee Pain Descriptors / Indicators: Guarding;Operative site guarding;Sore;Tightness Pain Intervention(s): Monitored during session;Repositioned    Home Living  Prior Function            PT Goals (current goals can now be found in the care plan section) Acute Rehab PT Goals Patient Stated Goal: to get his knee better than it was before PT Goal Formulation:  With patient Time For Goal Achievement: 02/26/16 Potential to Achieve Goals: Good Progress towards PT goals: Progressing toward goals    Frequency  7X/week    PT Plan Current plan remains appropriate    Co-evaluation             End of Session Equipment Utilized During Treatment: Gait belt Activity Tolerance: Patient tolerated treatment well Patient left: in chair;with call bell/phone within reach     Time: 1310-1327 PT Time Calculation (min) (ACUTE ONLY): 17 min  Charges:  $Gait Training: 8-22 mins                    G CodesAlessandra Bevels Lindsey Demonte Feb 29, 2016, 2:36 PM Deborah Chalk, PT, DPT [phone]

## 2016-02-20 NOTE — Progress Notes (Signed)
Physical Therapy Treatment Patient Details Name: Anthony Reese MRN: [id number] DOB: [birth year] Today's Date: 02/20/2016    History of Present Illness Pt presents s/p R TKR, no signficant PMH.     PT Comments    Pt presented supine in bed with HOB elevated, awake and willing to participate in therapy session. Pt making good progress towards goals and required less physical assistance with functional mobility as compared to previous session. Pt successfully completed stair training as well. Pt would continue to benefit from skilled physical therapy services at this time while admitted and after d/c to address his limitations in order to improve his overall safety and independence with functional mobility.    Follow Up Recommendations  Home health PT;Supervision for mobility/OOB     Equipment Recommendations  Other (comment) (pt has all DME needs)    Recommendations for Other Services       Precautions / Restrictions Precautions Precautions: Knee Precaution Comments: PT discussed positioning of R LE following TKA, emphasized no pillow under his knee. Required Braces or Orthoses: Knee Immobilizer - Right Knee Immobilizer - Right: On when out of bed or walking Restrictions Weight Bearing Restrictions: Yes RLE Weight Bearing: Weight bearing as tolerated    Mobility  Bed Mobility Overal bed mobility: Needs Assistance Bed Mobility: Supine to Sit     Supine to sit: Supervision;HOB elevated     General bed mobility comments: pt required increased time  Transfers Overall transfer level: Needs assistance Equipment used: Rolling walker (2 wheeled) Transfers: Sit to/from Stand Sit to Stand: Min guard         General transfer comment: pt required increased time  Ambulation/Gait Ambulation/Gait assistance: Supervision Ambulation Distance (Feet): 100 Feet (100 ft x2 with stair training in between) Assistive device: Rolling walker (2 wheeled) Gait Pattern/deviations: Step-to  pattern;Decreased step length - left;Decreased stance time - right;Decreased weight shift to right Gait velocity: decreased Gait velocity interpretation: Below normal speed for age/gender     Stairs Stairs: Yes Stairs assistance: Min guard Stair Management: No rails;Backwards;With walker Number of Stairs: 2 General stair comments: pt ascended with L LE leading and descended with R LE leading; min A with stabilization of RW  Wheelchair Mobility    Modified Rankin (Stroke Patients Only)       Balance Overall balance assessment: Needs assistance Sitting-balance support: Feet supported;No upper extremity supported Sitting balance-Leahy Scale: Good     Standing balance support: During functional activity;No upper extremity supported Standing balance-Leahy Scale: Fair                      Cognition Arousal/Alertness: Awake/alert Behavior During Therapy: WFL for tasks assessed/performed Overall Cognitive Status: Within Functional Limits for tasks assessed                      Exercises      General Comments        Pertinent Vitals/Pain Pain Assessment: 0-10 Pain Score: 5  Pain Location: R ankle and posterior knee Pain Descriptors / Indicators: Guarding;Sore Pain Intervention(s): Monitored during session;Repositioned    Home Living                      Prior Function            PT Goals (current goals can now be found in the care plan section) Acute Rehab PT Goals Patient Stated Goal: to get his knee better than it was before PT Goal Formulation: With  patient Time For Goal Achievement: 02/26/16 Potential to Achieve Goals: Good Progress towards PT goals: Progressing toward goals    Frequency  7X/week    PT Plan Current plan remains appropriate    Co-evaluation             End of Session Equipment Utilized During Treatment: Gait belt;Right knee immobilizer Activity Tolerance: Patient tolerated treatment well Patient left:  in chair;with call bell/phone within reach     Time: 0824-0844 PT Time Calculation (min) (ACUTE ONLY): 20 min  Charges:  $Gait Training: 8-22 mins                    G CodesAlessandra Reese Anthony Reese 03-19-16, 9:01 AM Anthony Reese, PT, DPT [phone]

## 2016-02-20 NOTE — Progress Notes (Signed)
Orthopedic Tech Progress Note Patient Details:  Anthony Reese 01/22/1971 [id number]  Patient ID: Anthony Reese, male   DOB: December 03, 1970, 45 y.o.   MRN: [id number]   [name] 02/20/2016, 1:48 PM Placed pt's rle on cpm @0 -60 degrees; will increase as pt tolerates; RN notified

## 2016-02-20 NOTE — Discharge Summary (Signed)
Patient ID: Anthony Reese MRN: [id number] DOB/AGE: 12/21/1970 45 y.o.  Admit date: 02/19/2016 Discharge date: 02/20/2016  Admission Diagnoses:  Principal Problem:   Primary osteoarthritis of right knee   Discharge Diagnoses:  Same  Past Medical History:  Diagnosis Date  . Arthritis   . Chicken pox   . H/O tobacco use, presenting hazards to health 07/31/2011  . Headache, cluster, episodic    hx over a yr  . Injury of left shoulder 01/30/2013  . Low testosterone   . Nasal polyp 11/18/2013  . Other and unspecified hyperlipidemia 11/18/2013  . Overweight(278.02)   . Preventative health care 11/18/2013  . Radiculopathy affecting upper extremity 11/20/2011  . Tobacco use disorder 07/31/2011    Surgeries: Procedure(s): RIGHT TOTAL KNEE ARTHROPLASTY on 02/19/2016   Discharged Condition: Improved  Hospital Course: Anthony Reese is an 45 y.o. male who was admitted 02/19/2016 for operative treatment ofPrimary osteoarthritis of right knee. Patient has severe unremitting pain that affects sleep, daily activities, and work/hobbies. After pre-op clearance the patient was taken to the operating room on 02/19/2016 and underwent  Procedure(s): RIGHT TOTAL KNEE ARTHROPLASTY.    Patient was given perioperative antibiotics: Anti-infectives    Start     Dose/Rate Route Frequency Ordered Stop   02/19/16 1400  clindamycin (CLEOCIN) IVPB 600 mg     600 mg 100 mL/hr over 30 Minutes Intravenous Every 6 hours 02/19/16 1308 02/19/16 2259   02/19/16 0518  clindamycin (CLEOCIN) IVPB 900 mg     900 mg 100 mL/hr over 30 Minutes Intravenous On call to O.R. 02/19/16 0518 02/19/16 0813       Patient was given sequential compression devices, early ambulation, and chemoprophylaxis to prevent DVT.  Patient benefited maximally from hospital stay and there were no complications.    Recent vital signs: Patient Vitals for the past 24 hrs:  BP Temp Temp src Pulse Resp SpO2  02/20/16 0436 126/76 97.6 F (36.4 C)  Oral 80 18 95 %  02/20/16 0013 126/67 98.1 F (36.7 C) Oral 91 18 95 %  02/19/16 2059 120/76 98.9 F (37.2 C) Oral 86 18 98 %  02/19/16 1257 (!) 141/89 97.3 F (36.3 C) Oral 67 18 100 %     Recent laboratory studies:  Recent Labs  02/20/16 0416  WBC 11.5*  HGB 13.5  HCT 39.7  PLT 228     Discharge Medications:     Medication List    STOP taking these medications   HYDROcodone-acetaminophen 5-325 MG tablet Commonly known as:  NORCO/VICODIN     TAKE these medications   aspirin EC 325 MG tablet Take 1 tablet (325 mg total) by mouth 2 (two) times daily after a meal. Take x 1 month post op to decrease risk of blood clots.   cetirizine 10 MG tablet Commonly known as:  ZYRTEC Take 1 tablet (10 mg total) by mouth 2 (two) times daily. What changed:  when to take this   fenofibrate micronized 130 MG capsule Commonly known as:  ANTARA Take 1 capsule (130 mg total) by mouth daily before breakfast.   methocarbamol 750 MG tablet Commonly known as:  ROBAXIN-750 Take 1 tablet (750 mg total) by mouth every 8 (eight) hours as needed for muscle spasms.   oxyCODONE-acetaminophen 5-325 MG tablet Commonly known as:  PERCOCET/ROXICET Take 1-2 tablets by mouth every 4 (four) hours as needed for severe pain.   rosuvastatin 40 MG tablet Commonly known as:  CRESTOR Take 1 tablet (40 mg total) by mouth  daily.   testosterone cypionate 200 MG/ML injection Commonly known as:  DEPOTESTOSTERONE CYPIONATE Inject 1 mL (200 mg total) into the muscle every 14 (fourteen) days.       Diagnostic Studies: Dg Chest 2 View  Result Date: 02/08/2016 CLINICAL DATA:  Preop knee surgery. EXAM: CHEST  2 VIEW COMPARISON:  None. FINDINGS: Trachea is midline. Heart size normal. Lungs are clear. No pleural fluid. Degenerative changes are seen in the acromioclavicular joints bilaterally. IMPRESSION: No acute findings. Electronically Signed   By: Lorin Picket M.D.   On: 02/08/2016 16:48    Disposition:  01-Home or Self Care  Discharge Instructions    CPM    Complete by:  As directed   Continuous passive motion machine (CPM):      Use the CPM from 0 to 90 degrees for 8 hours per day.      You may increase by 5-10 per day.  You may break it up into 2 or 3 sessions per day.      Use CPM for 1-2 weeks or until you are told to stop.   Call MD / Call 911    Complete by:  As directed   If you experience chest pain or shortness of breath, CALL 911 and be transported to the hospital emergency room.  If you develope a fever above 101 F, pus (white drainage) or increased drainage or redness at the wound, or calf pain, call your surgeon's office.   Constipation Prevention    Complete by:  As directed   Drink plenty of fluids.  Prune juice may be helpful.  You may use a stool softener, such as Colace (over the counter) 100 mg twice a day.  Use MiraLax (over the counter) for constipation as needed.   Diet general    Complete by:  As directed   Do not put a pillow under the knee. Place it under the heel.    Complete by:  As directed   Increase activity slowly as tolerated    Complete by:  As directed   Weight bearing as tolerated    Complete by:  As directed   Laterality:  right   Extremity:  Lower   Weight bearing as tolerated    Complete by:  As directed   Laterality:  right   Extremity:  Lower      Follow-up Information    GRAVES,JOHN L, MD. Schedule an appointment as soon as possible for a visit in 2 weeks.   Specialty:  Orthopedic Surgery Contact information: [place] [address] [phone]            Signed: Erlene Senters 02/20/2016, 12:50 PM

## 2016-02-22 ENCOUNTER — Encounter (HOSPITAL_COMMUNITY): Payer: Self-pay | Admitting: Orthopedic Surgery

## 2016-03-18 ENCOUNTER — Encounter: Payer: Self-pay | Admitting: Family Medicine

## 2016-03-18 ENCOUNTER — Ambulatory Visit (INDEPENDENT_AMBULATORY_CARE_PROVIDER_SITE_OTHER): Payer: BLUE CROSS/BLUE SHIELD | Admitting: Family Medicine

## 2016-03-18 ENCOUNTER — Telehealth: Payer: Self-pay | Admitting: Family Medicine

## 2016-03-18 VITALS — BP 120/82 | HR 81 | Temp 98.6°F | Ht 67.5 in | Wt 213.2 lb

## 2016-03-18 DIAGNOSIS — M1711 Unilateral primary osteoarthritis, right knee: Secondary | ICD-10-CM

## 2016-03-18 DIAGNOSIS — E663 Overweight: Secondary | ICD-10-CM

## 2016-03-18 DIAGNOSIS — E785 Hyperlipidemia, unspecified: Secondary | ICD-10-CM

## 2016-03-18 DIAGNOSIS — E349 Endocrine disorder, unspecified: Secondary | ICD-10-CM

## 2016-03-18 DIAGNOSIS — Z0000 Encounter for general adult medical examination without abnormal findings: Secondary | ICD-10-CM

## 2016-03-18 DIAGNOSIS — E291 Testicular hypofunction: Secondary | ICD-10-CM | POA: Diagnosis not present

## 2016-03-18 LAB — COMPREHENSIVE METABOLIC PANEL
ALBUMIN: 4.7 g/dL (ref 3.5–5.2)
ALK PHOS: 61 U/L (ref 39–117)
ALT: 34 U/L (ref 0–53)
AST: 19 U/L (ref 0–37)
BILIRUBIN TOTAL: 0.9 mg/dL (ref 0.2–1.2)
BUN: 17 mg/dL (ref 6–23)
CHLORIDE: 100 meq/L (ref 96–112)
CO2: 30 mEq/L (ref 19–32)
CREATININE: 0.78 mg/dL (ref 0.40–1.50)
Calcium: 9.5 mg/dL (ref 8.4–10.5)
GFR: 114.59 mL/min (ref >60.00)
Glucose, Bld: 89 mg/dL (ref 70–99)
Potassium: 4.3 mEq/L (ref 3.5–5.1)
SODIUM: 138 meq/L (ref 135–145)
TOTAL PROTEIN: 7.7 g/dL (ref 6.0–8.3)

## 2016-03-18 LAB — CBC
HCT: 44.1 % (ref 39.0–52.0)
Hemoglobin: 15.5 g/dL (ref 13.0–17.0)
MCHC: 35.1 g/dL (ref 30.0–36.0)
MCV: 93.9 fl (ref 78.0–100.0)
PLATELETS: 337 10*3/uL (ref 150.0–400.0)
RBC: 4.7 Mil/uL (ref 4.22–5.81)
RDW: 12.8 % (ref 11.5–15.5)
WBC: 6.2 10*3/uL (ref 4.0–10.5)

## 2016-03-18 LAB — LIPID PANEL
CHOL/HDL RATIO: 5
Cholesterol: 278 mg/dL — ABNORMAL HIGH (ref 0–200)
HDL: 51.1 mg/dL (ref >39.00)
NONHDL: 227.03
Triglycerides: 224 mg/dL — ABNORMAL HIGH (ref 0.0–149.0)
VLDL: 44.8 mg/dL — ABNORMAL HIGH (ref 0.0–40.0)

## 2016-03-18 LAB — TESTOSTERONE: TESTOSTERONE: 217.04 ng/dL — AB (ref 300.00–890.00)

## 2016-03-18 LAB — TSH: TSH: 1.34 u[IU]/mL (ref 0.35–4.50)

## 2016-03-18 LAB — LDL CHOLESTEROL, DIRECT: Direct LDL: 204 mg/dL

## 2016-03-18 MED ORDER — CARISOPRODOL 350 MG PO TABS: 350.0000 mg | ORAL_TABLET | Freq: Two times a day (BID) | ORAL | 1 refills | Status: DC | PRN

## 2016-03-18 MED ORDER — TESTOSTERONE CYPIONATE 200 MG/ML IM SOLN: 200.0000 mg | INTRAMUSCULAR | 2 refills | Status: DC

## 2016-03-18 NOTE — Progress Notes (Signed)
Pre visit review using our clinic review tool, if applicable. No additional management support is needed unless otherwise documented below in the visit note. 

## 2016-03-18 NOTE — Telephone Encounter (Signed)
Patient was prescribed today soma by PCP.   Pharmacist wanted PCP to be aware patient also is taking Hydrocodone/5-325 every 6 hours prescribed by Dr. Langston Reusing refill for norco was 03/08/16 #60 with 0 refills.   Is it ok to also fill the soma at this time along with taking the hydrocodone Pharmacist at [facility] in [place], [place]

## 2016-03-18 NOTE — Patient Instructions (Signed)

## 2016-03-20 NOTE — Telephone Encounter (Signed)
Yes it is OK thanks for asking

## 2016-03-21 NOTE — Telephone Encounter (Signed)
Pharmacist informed ok per PCP. 

## 2016-03-22 MED FILL — ETODOLAC 400 MG TABLET: 400 | 30 days supply | Qty: 60 | Fill #0

## 2016-03-22 MED FILL — HYDROCODON-APAP 10-325: 10-325 | 10 days supply | Qty: 40 | Fill #0

## 2016-03-22 MED FILL — AMITRIPTYLINE HCL 50 MG TAB: 50 | 30 days supply | Qty: 30 | Fill #0

## 2016-03-27 NOTE — Assessment & Plan Note (Signed)
Encouraged DASH diet, decrease po intake and increase exercise as tolerated. Needs 7-8 hours of sleep nightly. Avoid trans fats, eat small, frequent meals every 4-5 hours with lean proteins, complex carbs and healthy fats. Minimize simple carbs 

## 2016-03-27 NOTE — Assessment & Plan Note (Signed)
Is recovering from TKR and doing well in PT

## 2016-03-27 NOTE — Assessment & Plan Note (Signed)
Continue androgel and monitor

## 2016-03-27 NOTE — Assessment & Plan Note (Signed)
Tolerating statin, encouraged heart healthy diet, avoid trans fats, minimize simple carbs and saturated fats. Increase exercise as tolerated 

## 2016-03-27 NOTE — Progress Notes (Signed)
Patient ID: Anthony Reese, male   DOB: 03-19-1971, 45 y.o.   MRN: [id number]   Subjective:    Patient ID: Anthony Reese, male    DOB: February 19, 1971, 45 y.o.   MRN: [id number]  Chief Complaint  Patient presents with  . Follow-up    HPI Patient is in today for follow up on several medical conditions. He is off work temporarily after a TKR. He is recovering well. No recent illness or other acute concerns. He has not been taking his statin regularly.   Past Medical History:  Diagnosis Date  . Arthritis   . Chicken pox   . H/O tobacco use, presenting hazards to health 07/31/2011  . Headache, cluster, episodic    hx over a yr  . Injury of left shoulder 01/30/2013  . Low testosterone   . Nasal polyp 11/18/2013  . Other and unspecified hyperlipidemia 11/18/2013  . Overweight(278.02)   . Preventative health care 11/18/2013  . Radiculopathy affecting upper extremity 11/20/2011  . Tobacco use disorder 07/31/2011    Past Surgical History:  Procedure Laterality Date  . APPENDECTOMY    . BICEPS TENDON REPAIR  2012   right  . CARPAL TUNNEL RELEASE  2013   right  . KNEE SURGERY Right 2013   acl  . NASAL SINUS SURGERY  2016  . 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 TOTAL KNEE ARTHROPLASTY Right 02/19/2016   Procedure: RIGHT TOTAL KNEE ARTHROPLASTY;  Surgeon: Dorna Leitz, MD;  Location: [facility];  Service: Orthopedics;  Laterality: Right;    Family History  Problem Relation Age of Onset  . Cancer Mother     leukemia, pancreatic cancer  . Cancer Maternal Aunt     lung  . Cancer Maternal Grandfather     colon   . Cholelithiasis Sister     Social History   Social History  . Marital status: Married    Spouse name: N/A  . Number of children: N/A  . Years of education: N/A   Occupational History  . Not on file.   Social History Main Topics  . Smoking status: Former Smoker    Packs/day: 0.25    Years: 3.00    Quit date: 01/31/2012  . Smokeless tobacco: Never Used  . Alcohol use  8.4 oz/week    14 Cans of beer per week  . Drug use: No  . Sexual activity: Yes    Partners: Female     Comment: lives with wife, works for [employer], no dietary restrictions.   Other Topics Concern  . Not on file   Social History Narrative  . No narrative on file    Outpatient Medications Prior to Visit  Medication Sig Dispense Refill  . aspirin EC 325 MG tablet Take 1 tablet (325 mg total) by mouth 2 (two) times daily after a meal. Take x 1 month post op to decrease risk of blood clots. 60 tablet 0  . cetirizine (ZYRTEC) 10 MG tablet Take 1 tablet (10 mg total) by mouth 2 (two) times daily. (Patient taking differently: Take 10 mg by mouth daily. ) 60 tablet 11  . rosuvastatin (CRESTOR) 40 MG tablet Take 1 tablet (40 mg total) by mouth daily. 90 tablet 0  . fenofibrate micronized (ANTARA) 130 MG capsule Take 1 capsule (130 mg total) by mouth daily before breakfast. (Patient not taking: Reported on 03/18/2016) 30 capsule 3  . methocarbamol (ROBAXIN-750) 750 MG tablet Take 1 tablet (750 mg total) by mouth every 8 (eight)  hours as needed for muscle spasms. (Patient not taking: Reported on 03/18/2016) 60 tablet 0  . oxyCODONE-acetaminophen (PERCOCET/ROXICET) 5-325 MG tablet Take 1-2 tablets by mouth every 4 (four) hours as needed for severe pain. 60 tablet 0  . testosterone cypionate (DEPOTESTOSTERONE CYPIONATE) 200 MG/ML injection Inject 1 mL (200 mg total) into the muscle every 14 (fourteen) days. (Patient not taking: Reported on 03/18/2016) 10 mL 2   No facility-administered medications prior to visit.     Allergies  Allergen Reactions  . Imitrex [Sumatriptan]     Swelling around eyes and SOb  . Penicillins     Has patient had a PCN reaction causing immediate rash, facial/tongue/throat swelling, SOB or lightheadedness with hypotension: {unknown Has patient had a PCN reaction causing severe rash involving mucus membranes or skin necrosis: {unknown Has patient had  a PCN reaction that required hospitalization {unknwon Has patient had a PCN reaction occurring within the last 10 years: no If all of the above answers are "NO", then may proceed with Cephalosporin use.    Review of Systems  Constitutional: Positive for malaise/fatigue. Negative for fever.  HENT: Negative for congestion.   Eyes: Negative for blurred vision.  Respiratory: Negative for shortness of breath.   Cardiovascular: Negative for chest pain, palpitations and leg swelling.  Gastrointestinal: Negative for abdominal pain, blood in stool and nausea.  Genitourinary: Negative for dysuria and frequency.  Musculoskeletal: Positive for joint pain. Negative for falls.  Skin: Negative for rash.  Neurological: Negative for dizziness, loss of consciousness and headaches.  Endo/Heme/Allergies: Negative for environmental allergies.  Psychiatric/Behavioral: Negative for depression. The patient is not nervous/anxious.        Objective:    Physical Exam  Constitutional: He is oriented to person, place, and time. He appears well-developed and well-nourished. No distress.  HENT:  Head: Normocephalic and atraumatic.  Nose: Nose normal.  Eyes: Right eye exhibits no discharge. Left eye exhibits no discharge.  Neck: Normal range of motion. Neck supple.  Cardiovascular: Normal rate and regular rhythm.   No murmur heard. Pulmonary/Chest: Effort normal and breath sounds normal.  Abdominal: Soft. Bowel sounds are normal. There is no tenderness.  Musculoskeletal: He exhibits no edema.  Neurological: He is alert and oriented to person, place, and time.  Skin: Skin is warm and dry.  Psychiatric: He has a normal mood and affect.  Nursing note and vitals reviewed.   BP 120/82 (BP Location: Left Arm, Patient Position: Sitting, Cuff Size: Normal)   Pulse 81   Temp 98.6 F (37 C) (Oral)   Ht 5' 7.5" (1.715 m)   Wt 213 lb 4 oz (96.7 kg)   BMI 32.91 kg/m  Wt Readings from Last 3 Encounters:    03/18/16 213 lb 4 oz (96.7 kg)  02/08/16 215 lb 9.6 oz (97.8 kg)  06/12/14 203 lb (92.1 kg)     Lab Results  Component Value Date   WBC 6.2 03/18/2016   HGB 15.5 03/18/2016   HCT 44.1 03/18/2016   PLT 337.0 03/18/2016   GLUCOSE 89 03/18/2016   CHOL 278 (H) 03/18/2016   TRIG 224.0 (H) 03/18/2016   HDL 51.10 03/18/2016   LDLDIRECT 204.0 03/18/2016   LDLCALC 191 (H) 11/24/2015   ALT 34 03/18/2016   AST 19 03/18/2016   NA 138 03/18/2016   K 4.3 03/18/2016   CL 100 03/18/2016   CREATININE 0.78 03/18/2016   BUN 17 03/18/2016   CO2 30 03/18/2016   TSH 1.34 03/18/2016   PSA 0.74  07/01/2015   INR 1.01 02/08/2016    Lab Results  Component Value Date   TSH 1.34 03/18/2016   Lab Results  Component Value Date   WBC 6.2 03/18/2016   HGB 15.5 03/18/2016   HCT 44.1 03/18/2016   MCV 93.9 03/18/2016   PLT 337.0 03/18/2016   Lab Results  Component Value Date   NA 138 03/18/2016   K 4.3 03/18/2016   CO2 30 03/18/2016   GLUCOSE 89 03/18/2016   BUN 17 03/18/2016   CREATININE 0.78 03/18/2016   BILITOT 0.9 03/18/2016   ALKPHOS 61 03/18/2016   AST 19 03/18/2016   ALT 34 03/18/2016   PROT 7.7 03/18/2016   ALBUMIN 4.7 03/18/2016   CALCIUM 9.5 03/18/2016   ANIONGAP 10 02/08/2016   GFR 114.59 03/18/2016   Lab Results  Component Value Date   CHOL 278 (H) 03/18/2016   Lab Results  Component Value Date   HDL 51.10 03/18/2016   Lab Results  Component Value Date   LDLCALC 191 (H) 11/24/2015   Lab Results  Component Value Date   TRIG 224.0 (H) 03/18/2016   Lab Results  Component Value Date   CHOLHDL 5 03/18/2016   No results found for: HGBA1C     Assessment & Plan:   Problem List Items Addressed This Visit    Primary osteoarthritis of right knee (Chronic)    Is recovering from TKR and doing well in PT      Relevant Medications   HYDROcodone-acetaminophen (NORCO/VICODIN) 5-325 MG tablet   carisoprodol (SOMA) 350 MG tablet   Testosterone deficiency     Continue androgel and monitor      Relevant Medications   testosterone cypionate (DEPOTESTOSTERONE CYPIONATE) 200 MG/ML injection   Other Relevant Orders   Testosterone (Completed)   Overweight    Encouraged DASH diet, decrease po intake and increase exercise as tolerated. Needs 7-8 hours of sleep nightly. Avoid trans fats, eat small, frequent meals every 4-5 hours with lean proteins, complex carbs and healthy fats. Minimize simple carbs      Hyperlipidemia    Tolerating statin, encouraged heart healthy diet, avoid trans fats, minimize simple carbs and saturated fats. Increase exercise as tolerated      Relevant Orders   Lipid panel (Completed)   Preventative health care - Primary   Relevant Orders   TSH (Completed)   CBC (Completed)   Comprehensive metabolic panel (Completed)    Other Visit Diagnoses   None.     I have discontinued Mr. Mcgough's fenofibrate micronized, methocarbamol, and oxyCODONE-acetaminophen. I am also having him start on carisoprodol. Additionally, I am having him maintain his cetirizine, rosuvastatin, aspirin EC, HYDROcodone-acetaminophen, and testosterone cypionate.  Meds ordered this encounter  Medications  . HYDROcodone-acetaminophen (NORCO/VICODIN) 5-325 MG tablet    Sig: Take 1 tablet by mouth every 6 (six) hours as needed.  . carisoprodol (SOMA) 350 MG tablet    Sig: Take 1 tablet (350 mg total) by mouth 2 (two) times daily as needed for muscle spasms.    Dispense:  40 tablet    Refill:  1  . testosterone cypionate (DEPOTESTOSTERONE CYPIONATE) 200 MG/ML injection    Sig: Inject 1 mL (200 mg total) into the muscle every 14 (fourteen) days.    Dispense:  10 mL    Refill:  2     Penni Homans, MD

## 2016-04-04 MED FILL — HYDROCODON-APAP 10-325: 10-325 | 10 days supply | Qty: 40 | Fill #0

## 2016-04-19 MED FILL — HYDROCODON-APAP 10-325: 10-325 | 10 days supply | Qty: 40 | Fill #0

## 2016-05-09 MED FILL — HYDROCODON-APAP 5-325: 5-325 | 10 days supply | Qty: 40 | Fill #0

## 2016-06-13 MED FILL — predniSONE 10 MG TABS: 10 | 6 days supply | Qty: 21 | Fill #0

## 2016-07-07 ENCOUNTER — Encounter: Payer: Self-pay | Admitting: Family Medicine

## 2016-07-07 ENCOUNTER — Ambulatory Visit (INDEPENDENT_AMBULATORY_CARE_PROVIDER_SITE_OTHER): Payer: BLUE CROSS/BLUE SHIELD | Admitting: Family Medicine

## 2016-07-07 VITALS — BP 118/76 | HR 108 | Temp 98.8°F | Ht 68.0 in | Wt 227.0 lb

## 2016-07-07 DIAGNOSIS — J020 Streptococcal pharyngitis: Secondary | ICD-10-CM | POA: Diagnosis not present

## 2016-07-07 DIAGNOSIS — J111 Influenza due to unidentified influenza virus with other respiratory manifestations: Secondary | ICD-10-CM

## 2016-07-07 LAB — POCT RAPID STREP A (OFFICE): RAPID STREP A SCREEN: NEGATIVE

## 2016-07-07 MED ORDER — OSELTAMIVIR PHOSPHATE 75 MG PO CAPS: 75.0000 mg | ORAL_CAPSULE | Freq: Two times a day (BID) | ORAL | 0 refills | Status: AC

## 2016-07-07 NOTE — Patient Instructions (Signed)
Motrin 600 mg up to 3-4 times daily as needed for pain, chills and muscle aches.  Push fluids, wash hands, cover mouth when coughing.

## 2016-07-07 NOTE — Progress Notes (Signed)
Chief Complaint  Patient presents with  . Sore Throat    Pt reports sore throat , chest congestion and cough    Cline Cools here for URI complaints.  Duration: 1 day  Associated symptoms: sinus congestion, rhinorrhea, chills, ST, myalgia and cough Denies: subjective fever, SOB, ear pain and ear drainage Treatment to date: Nyquil, Aleve Sick contacts: No  ROS:  Const: Denies fevers HEENT: As noted in HPI Lungs: No SOB  Past Medical History:  Diagnosis Date  . Arthritis   . Chicken pox   . H/O tobacco use, presenting hazards to health 07/31/2011  . Headache, cluster, episodic    hx over a yr  . Injury of left shoulder 01/30/2013  . Low testosterone   . Nasal polyp 11/18/2013  . Other and unspecified hyperlipidemia 11/18/2013  . Overweight(278.02)   . Preventative health care 11/18/2013  . Radiculopathy affecting upper extremity 11/20/2011  . Tobacco use disorder 07/31/2011   Family History  Problem Relation Age of Onset  . Cancer Mother     leukemia, pancreatic cancer  . Cancer Maternal Aunt     lung  . Cancer Maternal Grandfather     colon   . Cholelithiasis Sister     BP 118/76 (BP Location: Right Arm, Patient Position: Sitting, Cuff Size: Large)   Pulse (!) 108   Temp 98.8 F (37.1 C) (Oral)   Ht 5\' 8"  (1.727 m)   Wt 227 lb (103 kg)   SpO2 97%   BMI 34.52 kg/m  General: Awake, alert, appears stated age HEENT: AT, [place], ears patent b/l and TM's neg, nares patent w/o discharge, pharynx pink and without exudates, MMM Neck: No masses or asymmetry Heart: RRR, no murmurs, no bruits Lungs: CTAB, no accessory muscle use Skin: Diaphoretic, no lesions appreciated Psych: Age appropriate judgment and insight, normal mood and affect  Influenza - Plan: oseltamivir (TAMIFLU) 75 MG capsule  Orders as above. Rapid Strep neg. Will treat empirically. Push fluids, practice good hand hygiene, cover mouth when coughing. Letter for work given. F/u in 1 week if symptoms worsen  or fail to improve. Pt voiced understanding and agreement to the plan.  [place], DO 07/07/16 10:41 AM

## 2016-07-07 NOTE — Progress Notes (Signed)
Pre visit review using our clinic review tool, if applicable. No additional management support is needed unless otherwise documented below in the visit note. 

## 2016-09-13 ENCOUNTER — Encounter: Payer: Self-pay | Admitting: Family Medicine

## 2016-09-14 ENCOUNTER — Other Ambulatory Visit: Payer: Self-pay | Admitting: *Deleted

## 2016-09-14 DIAGNOSIS — E785 Hyperlipidemia, unspecified: Secondary | ICD-10-CM

## 2016-09-14 DIAGNOSIS — E349 Endocrine disorder, unspecified: Secondary | ICD-10-CM

## 2016-09-14 NOTE — Telephone Encounter (Signed)
Called patient and scheduled lab appointment for 09/15/16. Future order placed.

## 2016-09-15 ENCOUNTER — Other Ambulatory Visit (INDEPENDENT_AMBULATORY_CARE_PROVIDER_SITE_OTHER): Payer: BLUE CROSS/BLUE SHIELD

## 2016-09-15 DIAGNOSIS — E349 Endocrine disorder, unspecified: Secondary | ICD-10-CM | POA: Diagnosis not present

## 2016-09-15 DIAGNOSIS — R7989 Other specified abnormal findings of blood chemistry: Secondary | ICD-10-CM

## 2016-09-15 DIAGNOSIS — E785 Hyperlipidemia, unspecified: Secondary | ICD-10-CM

## 2016-09-15 LAB — TESTOSTERONE: TESTOSTERONE: 175.97 ng/dL — AB (ref 300.00–890.00)

## 2016-09-16 LAB — COMPREHENSIVE METABOLIC PANEL
ALK PHOS: 50 U/L (ref 39–117)
ALT: 23 U/L (ref 0–53)
AST: 23 U/L (ref 0–37)
Albumin: 4.5 g/dL (ref 3.5–5.2)
BILIRUBIN TOTAL: 0.6 mg/dL (ref 0.2–1.2)
BUN: 15 mg/dL (ref 6–23)
CALCIUM: 9.5 mg/dL (ref 8.4–10.5)
CO2: 27 mEq/L (ref 19–32)
Chloride: 101 mEq/L (ref 96–112)
Creatinine, Ser: 1.63 mg/dL — ABNORMAL HIGH (ref 0.40–1.50)
GFR: 48.84 mL/min — AB (ref >60.00)
GLUCOSE: 82 mg/dL (ref 70–99)
Potassium: 4.6 mEq/L (ref 3.5–5.1)
Sodium: 135 mEq/L (ref 135–145)
TOTAL PROTEIN: 7.2 g/dL (ref 6.0–8.3)

## 2016-09-16 LAB — LDL CHOLESTEROL, DIRECT: Direct LDL: 146 mg/dL

## 2016-09-16 LAB — LIPID PANEL
CHOLESTEROL: 226 mg/dL — AB (ref 0–200)
HDL: 54.8 mg/dL (ref >39.00)
NonHDL: 171.26
TRIGLYCERIDES: 238 mg/dL — AB (ref 0.0–149.0)
Total CHOL/HDL Ratio: 4
VLDL: 47.6 mg/dL — AB (ref 0.0–40.0)

## 2016-09-19 ENCOUNTER — Other Ambulatory Visit: Payer: Self-pay | Admitting: Family Medicine

## 2016-09-19 DIAGNOSIS — N289 Disorder of kidney and ureter, unspecified: Secondary | ICD-10-CM

## 2016-09-19 MED ORDER — ROSUVASTATIN CALCIUM 40 MG PO TABS: 40.0000 mg | ORAL_TABLET | Freq: Every day | ORAL | 3 refills | Status: DC

## 2016-09-20 ENCOUNTER — Encounter: Payer: Self-pay | Admitting: Family Medicine

## 2016-09-20 DIAGNOSIS — E349 Endocrine disorder, unspecified: Secondary | ICD-10-CM

## 2016-09-27 MED ORDER — TESTOSTERONE CYPIONATE 200 MG/ML IM SOLN: 200.0000 mg | INTRAMUSCULAR | 2 refills | Status: DC

## 2016-09-27 MED FILL — TESTOSTERON CYP 2,000 MG/10: 200 | 28 days supply | Qty: 10 | Fill #0

## 2016-10-18 ENCOUNTER — Encounter: Payer: Self-pay | Admitting: Family Medicine

## 2016-10-20 ENCOUNTER — Other Ambulatory Visit (INDEPENDENT_AMBULATORY_CARE_PROVIDER_SITE_OTHER): Payer: BLUE CROSS/BLUE SHIELD

## 2016-10-20 DIAGNOSIS — N289 Disorder of kidney and ureter, unspecified: Secondary | ICD-10-CM | POA: Diagnosis not present

## 2016-10-20 LAB — COMPREHENSIVE METABOLIC PANEL
ALT: 31 U/L (ref 0–53)
AST: 23 U/L (ref 0–37)
Albumin: 4.6 g/dL (ref 3.5–5.2)
Alkaline Phosphatase: 59 U/L (ref 39–117)
BILIRUBIN TOTAL: 0.6 mg/dL (ref 0.2–1.2)
BUN: 17 mg/dL (ref 6–23)
CALCIUM: 9.4 mg/dL (ref 8.4–10.5)
CO2: 30 meq/L (ref 19–32)
Chloride: 103 mEq/L (ref 96–112)
Creatinine, Ser: 0.89 mg/dL (ref 0.40–1.50)
GFR: 98.14 mL/min (ref >60.00)
GLUCOSE: 100 mg/dL — AB (ref 70–99)
POTASSIUM: 4.2 meq/L (ref 3.5–5.1)
Sodium: 138 mEq/L (ref 135–145)
Total Protein: 7.5 g/dL (ref 6.0–8.3)

## 2016-11-04 ENCOUNTER — Other Ambulatory Visit: Payer: Self-pay | Admitting: Family Medicine

## 2016-11-04 ENCOUNTER — Encounter: Payer: Self-pay | Admitting: Family Medicine

## 2016-11-04 MED ORDER — METHYLPREDNISOLONE 4 MG PO TABS: ORAL_TABLET | ORAL | 0 refills | Status: DC

## 2016-11-09 ENCOUNTER — Ambulatory Visit (INDEPENDENT_AMBULATORY_CARE_PROVIDER_SITE_OTHER): Payer: BLUE CROSS/BLUE SHIELD | Admitting: Medical

## 2016-11-09 ENCOUNTER — Encounter: Payer: Self-pay | Admitting: Medical

## 2016-11-09 VITALS — BP 117/77 | HR 96 | Temp 97.9°F | Resp 16 | Ht 69.0 in | Wt 205.8 lb

## 2016-11-09 DIAGNOSIS — T7840XA Allergy, unspecified, initial encounter: Secondary | ICD-10-CM

## 2016-11-09 MED ORDER — PREDNISONE 10 MG PO TABS: ORAL_TABLET | ORAL | 0 refills | Status: DC

## 2016-11-09 MED ORDER — METHYLPREDNISOLONE ACETATE 80 MG/ML IJ SUSP
80.0000 mg | Freq: Once | INTRAMUSCULAR | Status: AC
  Administered 2016-11-09: 80 mg via INTRAMUSCULAR

## 2016-11-09 MED ORDER — HYDROXYZINE HCL 25 MG PO TABS: 25.0000 mg | ORAL_TABLET | Freq: Three times a day (TID) | ORAL | 0 refills | Status: DC | PRN

## 2016-11-09 MED FILL — predniSONE 10 MG TABS: 10 | 8 days supply | Qty: 36 | Fill #0

## 2016-11-09 MED FILL — hydrOXYzine HCL 25 MG TABS: 25 | 10 days supply | Qty: 30 | Fill #0

## 2016-11-09 NOTE — Progress Notes (Signed)
Subjective:    Patient ID: Anthony Reese, male    DOB: 11/12/1970, 46 y.o.   MRN: [id number]  HPI  Pt in for recent rash on his arms. He got rash since Friday. He was working in [employer] garden and he was pulling out weeds in garden. Exposure to some weeds. Maybe some poison ivy or oak. He states some mild allergies to poison ivy. Over weekend diffuse rash over left arm mostly but some on rt forearm hand, legs and groin.    Pt states pcp called in medrol dose pack. Pt got low dose medrol dose pack. Dosing was 20 mg day 1, 16 mg day 2, 12 mg day 3, 8 mg day 4, then 4 mg day 5.   Pt reports minimal improvement despite taper medrol dose pack.  Review of Systems  Constitutional: Negative for chills, fatigue and fever.  Respiratory: Negative for cough, chest tightness, shortness of breath and wheezing.   Cardiovascular: Negative for chest pain and palpitations.  Gastrointestinal: Negative for abdominal pain.  Genitourinary: Negative for dysuria, flank pain, hematuria and penile pain.  Musculoskeletal: Negative for back pain and gait problem.  Skin: Positive for rash.       See hpi and exam  Neurological: Negative for dizziness, seizures, syncope, weakness and headaches.  Hematological: Negative for adenopathy. Does not bruise/bleed easily.  Psychiatric/Behavioral: Negative for behavioral problems and confusion.   Past Medical History:  Diagnosis Date  . Arthritis   . Chicken pox   . H/O tobacco use, presenting hazards to health 07/31/2011  . Headache, cluster, episodic    hx over a yr  . Injury of left shoulder 01/30/2013  . Low testosterone   . Nasal polyp 11/18/2013  . Other and unspecified hyperlipidemia 11/18/2013  . Overweight(278.02)   . Preventative health care 11/18/2013  . Radiculopathy affecting upper extremity 11/20/2011  . Tobacco use disorder 07/31/2011     Social History   Social History  . Marital status: Married    Spouse name: N/A  . Number of children: N/A    . Years of education: N/A   Occupational History  . Not on file.   Social History Main Topics  . Smoking status: Former Smoker    Packs/day: 0.25    Years: 3.00    Quit date: 01/31/2012  . Smokeless tobacco: Never Used  . Alcohol use 8.4 oz/week    14 Cans of beer per week  . Drug use: No  . Sexual activity: Yes    Partners: Female     Comment: lives with wife, works for [employer], no dietary restrictions.   Other Topics Concern  . Not on file   Social History Narrative  . No narrative on file    Past Surgical History:  Procedure Laterality Date  . APPENDECTOMY    . BICEPS TENDON REPAIR  2012   right  . CARPAL TUNNEL RELEASE  2013   right  . KNEE SURGERY Right 2013   acl  . NASAL SINUS SURGERY  2016  . 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 TOTAL KNEE ARTHROPLASTY Right 02/19/2016   Procedure: RIGHT TOTAL KNEE ARTHROPLASTY;  Surgeon: Dorna Leitz, MD;  Location: [facility];  Service: Orthopedics;  Laterality: Right;    Family History  Problem Relation Age of Onset  . Cancer Mother     leukemia, pancreatic cancer  . Cancer Maternal Aunt     lung  . Cancer Maternal Grandfather     colon   .  Cholelithiasis Sister     Allergies  Allergen Reactions  . Imitrex [Sumatriptan]     Swelling around eyes and SOb  . Penicillins     Has patient had a PCN reaction causing immediate rash, facial/tongue/throat swelling, SOB or lightheadedness with hypotension: {unknown Has patient had a PCN reaction causing severe rash involving mucus membranes or skin necrosis: {unknown Has patient had a PCN reaction that required hospitalization {unknwon Has patient had a PCN reaction occurring within the last 10 years: no If all of the above answers are "NO", then may proceed with Cephalosporin use.    Current Outpatient Prescriptions on File Prior to Visit  Medication Sig Dispense Refill  . aspirin EC 325 MG tablet Take 1 tablet (325 mg total) by mouth 2  (two) times daily after a meal. Take x 1 month post op to decrease risk of blood clots. 60 tablet 0  . carisoprodol (SOMA) 350 MG tablet Take 1 tablet (350 mg total) by mouth 2 (two) times daily as needed for muscle spasms. 40 tablet 1  . cetirizine (ZYRTEC) 10 MG tablet Take 1 tablet (10 mg total) by mouth 2 (two) times daily. (Patient taking differently: Take 10 mg by mouth daily. ) 60 tablet 11  . HYDROcodone-acetaminophen (NORCO/VICODIN) 5-325 MG tablet Take 1 tablet by mouth every 6 (six) hours as needed.    . rosuvastatin (CRESTOR) 40 MG tablet Take 1 tablet (40 mg total) by mouth daily. 90 tablet 3  . testosterone cypionate (DEPOTESTOSTERONE CYPIONATE) 200 MG/ML injection Inject 1 mL (200 mg total) into the muscle every 14 (fourteen) days. 10 mL 2   No current facility-administered medications on file prior to visit.     BP 117/77 (BP Location: Right Arm, Patient Position: Sitting, Cuff Size: Large)   Pulse 96   Temp 97.9 F (36.6 C) (Oral)   Resp 16   Ht 5\' 9"  (1.753 m)   Wt 205 lb 12.8 oz (93.4 kg)   SpO2 98%   BMI 30.39 kg/m       Objective:   Physical Exam  General- No acute distress. Pleasant patient. Neck- Full range of motion, no jvd Lungs- Clear, even and unlabored. Heart- regular rate and rhythm. Neurologic- CNII- XII grossly intact.   Skin- scattered linear and papular rash on his forearm, and lesser rash on rt forearm. Hands has rash appearance as well. Pt describes severe rash on his groin and thighs.      Assessment & Plan:  You appear to have severe allergic reaction. Since you have minimal response to medrol dose will increase dose and duration. We gave depomedrol 80 mg im today and 8 day taper prednisone.  For itching rx hydroxyzine. Stop benadryl.  Follow up 7-8 days or as needed

## 2016-11-09 NOTE — Patient Instructions (Addendum)
You appear to have severe allergic reaction. Since you have minimal response to medrol dose will increase dose and duration. We gave depomedrol 80 mg im today and 8 day taper prednisone.  For itching rx hydroxyzine. Stop benadryl.  Follow up 7-8 days or as needed

## 2016-11-09 NOTE — Progress Notes (Signed)
Pre visit review using our clinic review tool, if applicable. No additional management support is needed unless otherwise documented below in the visit note. 

## 2016-11-15 ENCOUNTER — Encounter: Payer: Self-pay | Admitting: Medical

## 2016-11-17 ENCOUNTER — Encounter: Payer: Self-pay | Admitting: Family Medicine

## 2016-12-01 MED FILL — predniSONE 5 MG TABS: 5 | 6 days supply | Qty: 21 | Fill #0

## 2016-12-20 ENCOUNTER — Ambulatory Visit (INDEPENDENT_AMBULATORY_CARE_PROVIDER_SITE_OTHER): Payer: BLUE CROSS/BLUE SHIELD | Admitting: Family Medicine

## 2016-12-20 ENCOUNTER — Ambulatory Visit: Payer: BLUE CROSS/BLUE SHIELD | Admitting: Family Medicine

## 2016-12-20 ENCOUNTER — Encounter: Payer: Self-pay | Admitting: Family Medicine

## 2016-12-20 DIAGNOSIS — E349 Endocrine disorder, unspecified: Secondary | ICD-10-CM | POA: Diagnosis not present

## 2016-12-20 DIAGNOSIS — E663 Overweight: Secondary | ICD-10-CM | POA: Diagnosis not present

## 2016-12-20 DIAGNOSIS — E785 Hyperlipidemia, unspecified: Secondary | ICD-10-CM | POA: Diagnosis not present

## 2016-12-20 DIAGNOSIS — L255 Unspecified contact dermatitis due to plants, except food: Secondary | ICD-10-CM

## 2016-12-20 DIAGNOSIS — M79602 Pain in left arm: Secondary | ICD-10-CM | POA: Diagnosis not present

## 2016-12-20 DIAGNOSIS — T7840XS Allergy, unspecified, sequela: Secondary | ICD-10-CM

## 2016-12-20 DIAGNOSIS — G44019 Episodic cluster headache, not intractable: Secondary | ICD-10-CM

## 2016-12-20 DIAGNOSIS — M1711 Unilateral primary osteoarthritis, right knee: Secondary | ICD-10-CM

## 2016-12-20 NOTE — Assessment & Plan Note (Signed)
Encouraged DASH diet, decrease po intake and increase exercise as tolerated. Needs 7-8 hours of sleep nightly. Avoid trans fats, eat small, frequent meals every 4-5 hours with lean proteins, complex carbs and healthy fats. Minimize simple carbs 

## 2016-12-20 NOTE — Assessment & Plan Note (Addendum)
Encouraged heart healthy diet, increase exercise, avoid trans fats, consider a krill oil cap daily. Takes Rosuvastatin 6 days a week

## 2016-12-20 NOTE — Progress Notes (Signed)
Subjective:  I acted as a Education administrator for Dr. Charlett Blake. [name], [place]  Patient ID: Anthony Reese, male    DOB: 04-24-1971, 46 y.o.   MRN: [id number]  No chief complaint on file.   HPI  Patient is in today for a follow up. Patient c/o right hand pain and left knee pain. Patient states he had a knee replacement last year and now my need more PT. No recent febrile illness or acute hospitalizations. Denies CP/palp/SOB/HA/congestion/fevers/GI or GU c/o. Taking meds as prescribed. He feels well today. He had a flare in poison ivy but that responded to steroids. He is having pain in his left hand since he had an accident with a tiller. His 3rd and 4th fingers hyperextended. He has had pain up to left elbow since then, no swelling or redness. Also struggling with constant pain in right knee and lateral upper right calf pain ever his his right TKR last summer. He is interested in getting it checked out by a new provider. It hurts daily and worse with ambulation.   Patient Care Team: Mosie Lukes, MD as PCP - General (Family Medicine)   Past Medical History:  Diagnosis Date  . Arthritis   . Chicken pox   . H/O tobacco use, presenting hazards to health 07/31/2011  . Headache, cluster, episodic    hx over a yr  . Injury of left shoulder 01/30/2013  . Low testosterone   . Nasal polyp 11/18/2013  . Other and unspecified hyperlipidemia 11/18/2013  . Overweight(278.02)   . Preventative health care 11/18/2013  . Radiculopathy affecting upper extremity 11/20/2011  . Tobacco use disorder 07/31/2011    Past Surgical History:  Procedure Laterality Date  . APPENDECTOMY    . BICEPS TENDON REPAIR  2012   right  . CARPAL TUNNEL RELEASE  2013   right  . KNEE SURGERY Right 2013   acl  . NASAL SINUS SURGERY  2016  . 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 TOTAL KNEE ARTHROPLASTY Right 02/19/2016   Procedure: RIGHT TOTAL KNEE ARTHROPLASTY;  Surgeon: Dorna Leitz, MD;  Location: [facility];  Service: Orthopedics;   Laterality: Right;    Family History  Problem Relation Age of Onset  . Cancer Mother        leukemia, pancreatic cancer  . Cancer Maternal Aunt        lung  . Cancer Maternal Grandfather        colon   . Cholelithiasis Sister     Social History   Social History  . Marital status: Married    Spouse name: N/A  . Number of children: N/A  . Years of education: N/A   Occupational History  . Not on file.   Social History Main Topics  . Smoking status: Former Smoker    Packs/day: 0.25    Years: 3.00    Quit date: 01/31/2012  . Smokeless tobacco: Never Used  . Alcohol use 8.4 oz/week    14 Cans of beer per week  . Drug use: No  . Sexual activity: Yes    Partners: Female     Comment: lives with wife, works for [employer], no dietary restrictions.   Other Topics Concern  . Not on file   Social History Narrative  . No narrative on file    Outpatient Medications Prior to Visit  Medication Sig Dispense Refill  . cetirizine (ZYRTEC) 10 MG tablet Take 1 tablet (10 mg total) by mouth 2 (two) times daily. (  Patient taking differently: Take 10 mg by mouth daily. ) 60 tablet 11  . rosuvastatin (CRESTOR) 40 MG tablet Take 1 tablet (40 mg total) by mouth daily. 90 tablet 3  . testosterone cypionate (DEPOTESTOSTERONE CYPIONATE) 200 MG/ML injection Inject 1 mL (200 mg total) into the muscle every 14 (fourteen) days. 10 mL 2  . aspirin EC 325 MG tablet Take 1 tablet (325 mg total) by mouth 2 (two) times daily after a meal. Take x 1 month post op to decrease risk of blood clots. 60 tablet 0  . carisoprodol (SOMA) 350 MG tablet Take 1 tablet (350 mg total) by mouth 2 (two) times daily as needed for muscle spasms. 40 tablet 1  . HYDROcodone-acetaminophen (NORCO/VICODIN) 5-325 MG tablet Take 1 tablet by mouth every 6 (six) hours as needed.    . hydrOXYzine (ATARAX/VISTARIL) 25 MG tablet Take 1 tablet (25 mg total) by mouth 3 (three) times daily as needed for itching. 30  tablet 0  . predniSONE (DELTASONE) 10 MG tablet 8 tab po day 1, 7 tab po day 2, 6 tab po day 3, 5 tab po day 4, 4 tab po day 5, 3 tab po day 6, 2 tab po day 7, and 1 tab po day 8 36 tablet 0   No facility-administered medications prior to visit.     Allergies  Allergen Reactions  . Imitrex [Sumatriptan]     Swelling around eyes and SOb  . Penicillins     Has patient had a PCN reaction causing immediate rash, facial/tongue/throat swelling, SOB or lightheadedness with hypotension: {unknown Has patient had a PCN reaction causing severe rash involving mucus membranes or skin necrosis: {unknown Has patient had a PCN reaction that required hospitalization {unknwon Has patient had a PCN reaction occurring within the last 10 years: no If all of the above answers are "NO", then may proceed with Cephalosporin use.    Review of Systems  Constitutional: Negative for fever and malaise/fatigue.  HENT: Negative for congestion.   Eyes: Negative for blurred vision.  Respiratory: Negative for cough and shortness of breath.   Cardiovascular: Negative for chest pain, palpitations and leg swelling.  Gastrointestinal: Negative for vomiting.  Musculoskeletal: Positive for joint pain and myalgias. Negative for back pain.  Skin: Negative for rash.  Neurological: Negative for loss of consciousness and headaches.       Objective:    Physical Exam  Constitutional: He is oriented to person, place, and time. He appears well-developed and well-nourished. No distress.  HENT:  Head: Normocephalic and atraumatic.  Nose: Nose normal.  Eyes: Conjunctivae are normal. Right eye exhibits no discharge. Left eye exhibits no discharge.  Neck: Normal range of motion. Neck supple. No thyromegaly present.  Cardiovascular: Normal rate and regular rhythm.   No murmur heard. Pulmonary/Chest: Effort normal and breath sounds normal. He has no wheezes.  Abdominal: Soft. Bowel sounds are normal. There is no tenderness.    Musculoskeletal: Normal range of motion. He exhibits no edema or deformity.  Neurological: He is alert and oriented to person, place, and time.  Skin: Skin is warm and dry. He is not diaphoretic.  Psychiatric: He has a normal mood and affect.  Nursing note and vitals reviewed.   BP 102/68 (BP Location: Left Arm, Patient Position: Sitting, Cuff Size: Normal)   Pulse 85   Temp 98.3 F (36.8 C) (Oral)   Resp 18   Ht 5\' 9"  (1.753 m)   Wt 203 lb 9.6 oz (92.4 kg)  SpO2 97%   BMI 30.07 kg/m  Wt Readings from Last 3 Encounters:  12/20/16 203 lb 9.6 oz (92.4 kg)  11/09/16 205 lb 12.8 oz (93.4 kg)  07/07/16 227 lb (103 kg)   BP Readings from Last 3 Encounters:  12/20/16 102/68  11/09/16 117/77  07/07/16 118/76      There is no immunization history on file for this patient.  Health Maintenance  Topic Date Due  . HIV Screening  07/27/1986  . TETANUS/TDAP  07/27/1990  . INFLUENZA VACCINE  04/17/2017 (Originally 02/01/2017)    Lab Results  Component Value Date   WBC 6.2 03/18/2016   HGB 15.5 03/18/2016   HCT 44.1 03/18/2016   PLT 337.0 03/18/2016   GLUCOSE 100 (H) 10/20/2016   CHOL 226 (H) 09/15/2016   TRIG 238.0 (H) 09/15/2016   HDL 54.80 09/15/2016   LDLDIRECT 146.0 09/15/2016   LDLCALC 191 (H) 11/24/2015   ALT 31 10/20/2016   AST 23 10/20/2016   NA 138 10/20/2016   K 4.2 10/20/2016   CL 103 10/20/2016   CREATININE 0.89 10/20/2016   BUN 17 10/20/2016   CO2 30 10/20/2016   TSH 1.34 03/18/2016   PSA 0.74 07/01/2015   INR 1.01 02/08/2016    Lab Results  Component Value Date   TSH 1.34 03/18/2016   Lab Results  Component Value Date   WBC 6.2 03/18/2016   HGB 15.5 03/18/2016   HCT 44.1 03/18/2016   MCV 93.9 03/18/2016   PLT 337.0 03/18/2016   Lab Results  Component Value Date   NA 138 10/20/2016   K 4.2 10/20/2016   CO2 30 10/20/2016   GLUCOSE 100 (H) 10/20/2016   BUN 17 10/20/2016   CREATININE 0.89 10/20/2016   BILITOT 0.6 10/20/2016   ALKPHOS 59  10/20/2016   AST 23 10/20/2016   ALT 31 10/20/2016   PROT 7.5 10/20/2016   ALBUMIN 4.6 10/20/2016   CALCIUM 9.4 10/20/2016   ANIONGAP 10 02/08/2016   GFR 98.14 10/20/2016   Lab Results  Component Value Date   CHOL 226 (H) 09/15/2016   Lab Results  Component Value Date   HDL 54.80 09/15/2016   Lab Results  Component Value Date   LDLCALC 191 (H) 11/24/2015   Lab Results  Component Value Date   TRIG 238.0 (H) 09/15/2016   Lab Results  Component Value Date   CHOLHDL 4 09/15/2016   No results found for: HGBA1C       Assessment & Plan:   Problem List Items Addressed This Visit    Primary osteoarthritis of right knee (Chronic)    Now with persistent pain over lateral upper calf after TKR last summer, try compression socks and topical lidocaine. If no improvement then will need referral to sports med, Dr Paulla Fore, Sports Med for further consideration. Meloxicam helps some.      Relevant Medications   meloxicam (MOBIC) 15 MG tablet   Headache, cluster, episodic    Encouraged increased hydration, 64 ounces of clear fluids daily. Minimize alcohol and caffeine. Eat small frequent meals with lean proteins and complex carbs. Avoid high and low blood sugars. Get adequate sleep, 7-8 hours a night. Needs exercise daily preferably in the morning. Had a recent flare that lasted a month but he did not take any meds because they do not help. He feels well todya      Relevant Medications   meloxicam (MOBIC) 15 MG tablet   Testosterone deficiency    Supplements and will check  level today. Has been taking injections every 2 weeks. Last shot on 12/11/2016      Relevant Orders   Testosterone   Overweight    Encouraged DASH diet, decrease po intake and increase exercise as tolerated. Needs 7-8 hours of sleep nightly. Avoid trans fats, eat small, frequent meals every 4-5 hours with lean proteins, complex carbs and healthy fats. Minimize simple carbs      Hyperlipidemia    Encouraged heart  healthy diet, increase exercise, avoid trans fats, consider a krill oil cap daily. Takes Rosuvastatin 6 days a week      Relevant Orders   Lipid panel   TSH   Contact dermatitis    Recent episode of poison ivy has resolved      Allergic state    Better since nasal surgery, no further meds      Left arm pain    Had an injury to his 3rd and 4th finger while using a tiller it kicked back and hyperextended his fingers. Since then he has had constant pain in left arm that worsens at night when he lies on it. It hurts up to elbow level at time. He has a Copy that released a carpal tunnel on the right which helped so he will contact them if worsen      Relevant Orders   CBC   Comprehensive metabolic panel      I have discontinued Mr. Pires's aspirin EC, HYDROcodone-acetaminophen, carisoprodol, hydrOXYzine, and predniSONE. I am also having him maintain his cetirizine, rosuvastatin, testosterone cypionate, and meloxicam.  Meds ordered this encounter  Medications  . meloxicam (MOBIC) 15 MG tablet    Sig: Take 1 tablet by mouth daily.    CMA served as Education administrator during this visit. History, Physical and Plan performed by medical provider. Documentation and orders reviewed and attested to.  Penni Homans, MD

## 2016-12-20 NOTE — Assessment & Plan Note (Addendum)
Supplements and will check level today. Has been taking injections every 2 weeks. Last shot on 12/11/2016

## 2016-12-20 NOTE — Assessment & Plan Note (Signed)
Better since nasal surgery, no further meds

## 2016-12-20 NOTE — Patient Instructions (Signed)
Ice and Lidocaine gel twice daily for left hand pain and calf pain Cholesterol Cholesterol is a white, waxy, fat-like substance that is needed by the human body in small amounts. The liver makes all the cholesterol we need. Cholesterol is carried from the liver by the blood through the blood vessels. Deposits of cholesterol (plaques) may build up on blood vessel (artery) walls. Plaques make the arteries narrower and stiffer. Cholesterol plaques increase the risk for heart attack and stroke. You cannot feel your cholesterol level even if it is very high. The only way to know that it is high is to have a blood test. Once you know your cholesterol levels, you should keep a record of the test results. Work with your health care provider to keep your levels in the desired range. What do the results mean?  Total cholesterol is a rough measure of all the cholesterol in your blood.  LDL (low-density lipoprotein) is the "bad" cholesterol. This is the type that causes plaque to build up on the artery walls. You want this level to be low.  HDL (high-density lipoprotein) is the "good" cholesterol because it cleans the arteries and carries the LDL away. You want this level to be high.  Triglycerides are fat that the body can either burn for energy or store. High levels are closely linked to heart disease. What are the desired levels of cholesterol?  Total cholesterol below 200.  LDL below 100 for people who are at risk, below 70 for people at very high risk.  HDL above 40 is good. A level of 60 or higher is considered to be protective against heart disease.  Triglycerides below 150. How can I lower my cholesterol? Diet Follow your diet program as told by your health care provider.  Choose fish or white meat chicken and [place], roasted or baked. Limit fatty cuts of red meat, fried foods, and processed meats, such as sausage and lunch meats.  Eat lots of fresh fruits and vegetables.  Choose whole  grains, beans, pasta, potatoes, and cereals.  Choose olive oil, corn oil, or canola oil, and use only small amounts.  Avoid butter, mayonnaise, shortening, or palm kernel oils.  Avoid foods with trans fats.  Drink skim or nonfat milk and eat low-fat or nonfat yogurt and cheeses. Avoid whole milk, cream, ice cream, egg yolks, and full-fat cheeses.  Healthier desserts include angel food cake, ginger snaps, animal crackers, hard candy, popsicles, and low-fat or nonfat frozen yogurt. Avoid pastries, cakes, pies, and cookies.  Exercise  Follow your exercise program as told by your health care provider. A regular program: ? Helps to decrease LDL and raise HDL. ? Helps with weight control.  Do things that increase your activity level, such as gardening, walking, and taking the stairs.  Ask your health care provider about ways that you can be more active in your daily life.  Medicine  Take over-the-counter and prescription medicines only as told by your health care provider. ? Medicine may be prescribed by your health care provider to help lower cholesterol and decrease the risk for heart disease. This is usually done if diet and exercise have failed to bring down cholesterol levels. ? If you have several risk factors, you may need medicine even if your levels are normal.  This information is not intended to replace advice given to you by your health care provider. Make sure you discuss any questions you have with your health care provider. Document Released: 03/15/2001 Document Revised: 01/16/2016  Document Reviewed: 12/19/2015 Elsevier Interactive Patient Education  2017 [employer].

## 2016-12-20 NOTE — Assessment & Plan Note (Signed)
Recent episode of poison ivy has resolved

## 2016-12-20 NOTE — Assessment & Plan Note (Signed)
Had an injury to his 3rd and 4th finger while using a tiller it kicked back and hyperextended his fingers. Since then he has had constant pain in left arm that worsens at night when he lies on it. It hurts up to elbow level at time. He has a Copy that released a carpal tunnel on the right which helped so he will contact them if worsen

## 2016-12-20 NOTE — Assessment & Plan Note (Addendum)
Now with persistent pain over lateral upper calf after TKR last summer, try compression socks and topical lidocaine. If no improvement then will need referral to sports med, Dr Paulla Fore, Sports Med for further consideration. Meloxicam helps some.

## 2016-12-20 NOTE — Assessment & Plan Note (Addendum)
Encouraged increased hydration, 64 ounces of clear fluids daily. Minimize alcohol and caffeine. Eat small frequent meals with lean proteins and complex carbs. Avoid high and low blood sugars. Get adequate sleep, 7-8 hours a night. Needs exercise daily preferably in the morning. Had a recent flare that lasted a month but he did not take any meds because they do not help. He feels well todya

## 2016-12-21 LAB — LIPID PANEL
CHOLESTEROL: 178 mg/dL (ref 0–200)
HDL: 58.9 mg/dL (ref >39.00)
LDL Cholesterol: 94 mg/dL (ref 0–99)
NONHDL: 119.58
Total CHOL/HDL Ratio: 3
Triglycerides: 128 mg/dL (ref 0.0–149.0)
VLDL: 25.6 mg/dL (ref 0.0–40.0)

## 2016-12-21 LAB — COMPREHENSIVE METABOLIC PANEL
ALBUMIN: 4.5 g/dL (ref 3.5–5.2)
ALK PHOS: 44 U/L (ref 39–117)
ALT: 19 U/L (ref 0–53)
AST: 25 U/L (ref 0–37)
BILIRUBIN TOTAL: 0.6 mg/dL (ref 0.2–1.2)
BUN: 23 mg/dL (ref 6–23)
CO2: 29 mEq/L (ref 19–32)
Calcium: 9.8 mg/dL (ref 8.4–10.5)
Chloride: 102 mEq/L (ref 96–112)
Creatinine, Ser: 0.84 mg/dL (ref 0.40–1.50)
GFR: 104.84 mL/min (ref >60.00)
GLUCOSE: 77 mg/dL (ref 70–99)
Potassium: 4.6 mEq/L (ref 3.5–5.1)
SODIUM: 138 meq/L (ref 135–145)
TOTAL PROTEIN: 7.1 g/dL (ref 6.0–8.3)

## 2016-12-21 LAB — CBC
HEMATOCRIT: 45.9 % (ref 39.0–52.0)
Hemoglobin: 15.5 g/dL (ref 13.0–17.0)
MCHC: 33.8 g/dL (ref 30.0–36.0)
MCV: 96.5 fl (ref 78.0–100.0)
Platelets: 313 10*3/uL (ref 150.0–400.0)
RBC: 4.75 Mil/uL (ref 4.22–5.81)
RDW: 13.8 % (ref 11.5–15.5)
WBC: 6.7 10*3/uL (ref 4.0–10.5)

## 2016-12-21 LAB — TESTOSTERONE: TESTOSTERONE: 404.47 ng/dL (ref 300.00–890.00)

## 2016-12-21 LAB — TSH: TSH: 2.18 u[IU]/mL (ref 0.35–4.50)

## 2016-12-22 MED FILL — TESTOSTERON CYP 2,000 MG/10: 200 | 28 days supply | Qty: 10 | Fill #1

## 2017-02-09 ENCOUNTER — Other Ambulatory Visit: Payer: Self-pay | Admitting: Family Medicine

## 2017-03-16 MED FILL — ETODOLAC 400 MG TABLET: 400 | 30 days supply | Qty: 60 | Fill #0

## 2017-03-28 ENCOUNTER — Encounter: Payer: Self-pay | Admitting: Family Medicine

## 2017-03-29 NOTE — Telephone Encounter (Signed)
Pt is requesting refill on testosterone cypionate.   Last OV: 12/20/2016 Last Fill: 09/27/2016 #9mL and 2RF   Please advise.

## 2017-04-01 ENCOUNTER — Encounter: Payer: Self-pay | Admitting: Family Medicine

## 2017-04-01 DIAGNOSIS — E349 Endocrine disorder, unspecified: Secondary | ICD-10-CM

## 2017-04-03 ENCOUNTER — Telehealth: Payer: Self-pay | Admitting: Family Medicine

## 2017-04-03 MED ORDER — TESTOSTERONE CYPIONATE 200 MG/ML IM SOLN: 200.0000 mg | INTRAMUSCULAR | 2 refills | Status: DC

## 2017-04-03 NOTE — Telephone Encounter (Signed)
Printed Rx for SB to sign/I called pharm and they said that he only needs a refill/thx dmf

## 2017-04-03 NOTE — Telephone Encounter (Signed)
Caller name: Relation to KD:TOIZ Call back [place] [place] [facility]  Reason for call: pt is needing refill on testosterone cypionate (DEPOTESTOSTERONE CYPIONATE) 200 MG/ML injection, states he is 2 weeks behind, sent Dr. Charlett Blake my chart message last week but no response, states pharmacy sent request as well with no response.

## 2017-04-04 NOTE — Telephone Encounter (Signed)
Patient notified of rx was ready for pick up.   Pc

## 2017-04-06 MED FILL — TESTOSTERONE CYP 200 MG/ML: 200 | 27 days supply | Qty: 2 | Fill #0

## 2017-05-18 MED FILL — TESTOSTERONE CYP 200 MG/ML: 200 | 27 days supply | Qty: 2 | Fill #1

## 2017-06-19 MED FILL — TESTOSTERONE CYPIONATE 200: 200 | 27 days supply | Qty: 2 | Fill #2

## 2017-07-13 MED FILL — TESTOSTERONE CYPIONATE 200: 200 | 27 days supply | Qty: 2 | Fill #3 | Status: TO

## 2017-09-09 ENCOUNTER — Other Ambulatory Visit: Payer: Self-pay | Admitting: Family Medicine

## 2017-09-26 ENCOUNTER — Ambulatory Visit (HOSPITAL_BASED_OUTPATIENT_CLINIC_OR_DEPARTMENT_OTHER)
Admission: RE | Admit: 2017-09-26 | Discharge: 2017-09-26 | Disposition: A | Discharge status: Home / Self Care | Payer: BLUE CROSS/BLUE SHIELD | Source: Ambulatory Visit | Attending: Internal Medicine | Admitting: Internal Medicine

## 2017-09-26 ENCOUNTER — Encounter: Payer: Self-pay | Admitting: Internal Medicine

## 2017-09-26 ENCOUNTER — Ambulatory Visit: Payer: BLUE CROSS/BLUE SHIELD | Admitting: Internal Medicine

## 2017-09-26 VITALS — BP 124/72 | HR 81 | Temp 98.3°F | Resp 14 | Ht 69.0 in | Wt 217.5 lb

## 2017-09-26 DIAGNOSIS — S20211A Contusion of right front wall of thorax, initial encounter: Secondary | ICD-10-CM | POA: Insufficient documentation

## 2017-09-26 DIAGNOSIS — X58XXXA Exposure to other specified factors, initial encounter: Secondary | ICD-10-CM | POA: Insufficient documentation
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
## 2017-09-26 NOTE — Patient Instructions (Signed)
  STOP BY THE FIRST FLOOR:  get the XR   IBUPROFEN (Advil or Motrin) 200 mg 2 tablets every 6 hours as needed for pain.  Always take it with food because may cause gastritis and ulcers.  If you notice nausea, stomach pain, change in the color of stools --->  Stop the medicine and let us know  Tylenol  500 mg OTC 2 tabs a day every 8 hours as needed for pain

## 2017-09-26 NOTE — Progress Notes (Signed)
Pre visit review using our clinic review tool, if applicable. No additional management support is needed unless otherwise documented below in the visit note. 

## 2017-11-23 ENCOUNTER — Ambulatory Visit: Payer: BLUE CROSS/BLUE SHIELD | Admitting: Family Medicine

## 2017-11-23 ENCOUNTER — Encounter: Payer: Self-pay | Admitting: Family Medicine

## 2017-11-23 VITALS — BP 118/80 | HR 81 | Temp 98.2°F | Resp 16 | Ht 69.0 in | Wt 218.4 lb

## 2017-11-23 DIAGNOSIS — Z23 Encounter for immunization: Secondary | ICD-10-CM

## 2017-11-23 DIAGNOSIS — T63441A Toxic effect of venom of bees, accidental (unintentional), initial encounter: Secondary | ICD-10-CM | POA: Insufficient documentation

## 2017-11-23 DIAGNOSIS — L03114 Cellulitis of left upper limb: Secondary | ICD-10-CM

## 2017-11-23 MED ORDER — PREDNISONE 10 MG PO TABS: ORAL_TABLET | ORAL | 0 refills | Status: DC

## 2017-11-23 MED ORDER — CLINDAMYCIN HCL 300 MG PO CAPS: 300.0000 mg | ORAL_CAPSULE | Freq: Four times a day (QID) | ORAL | 0 refills | Status: DC

## 2017-11-23 MED FILL — predniSONE 10 MG TABS: 10 | 12 days supply | Qty: 20 | Fill #0

## 2017-11-23 MED FILL — CLINDAMYCIN HCL 300 MG CAP: 300 | 10 days supply | Qty: 40 | Fill #0

## 2017-11-23 NOTE — Patient Instructions (Addendum)
Bee, Wasp, or [employer], Adult Bees, wasps, and hornets are part of a family of insects that can sting people. These stings can cause pain and inflammation, but they are usually not serious. However, some people may have an allergic reaction to a sting. This can cause the symptoms to be more severe. What increases the risk? You may be at a greater risk of getting stung if you:  Provoke a stinging insect by swatting or disturbing it.  Wear strong-smelling soaps, deodorants, or body sprays.  Spend time outdoors near gardens with flowers or fruit trees or in clothes that expose skin.  Eat or drink outside.  What are the signs or symptoms? Common symptoms of this condition include:  A red lump in the skin that sometimes has a tiny hole in the center. In some cases, a stinger may be in the center of the wound.  Pain and itching at the sting site.  Redness and swelling around the sting site. If you have an allergic reaction (localized allergic reaction), the swelling and redness may spread out from the sting site. In some cases, this reaction can continue to develop over the next 24-48 hours.  In rare cases, a person may have a severe allergic reaction (anaphylactic reaction) to a sting. Symptoms of an anaphylactic reaction may include:  Wheezing or difficulty breathing.  Raised, itchy, red patches on the skin (hives).  Nausea or vomiting.  Abdominal cramping.  Diarrhea.  Tightness in the chest or chest pain.  Dizziness or fainting.  Redness of the face (flushing).  Hoarse voice.  Swollen tongue, lips, or face.  How is this diagnosed? This condition is usually diagnosed based on your symptoms and medical history as well as a physical exam. You may have an allergy test to determine if you are allergic to the substance that the insect injected during the sting (venom). How is this treated? If you were stung by a bee, the stinger and a small sac of venom may be in the wound.  It is important to remove the stinger as soon as possible. You can do this by brushing across the wound with gauze, a fingernail, or a flat card such as a credit card. Removing the stinger can help reduce the severity of your body's reaction to the sting. Most stings can be treated with:  Icing to reduce swelling in the area.  Medicines (antihistamines) to treat itching or an allergic reaction.  Medicines to help reduce pain. These may be medicines that you take by mouth, or medicated creams or lotions that you apply to your skin.  Pay close attention to your symptoms after you have been stung. If possible, have someone stay with you to make sure you do not have an allergic reaction. If you have any signs of an allergic reaction, call your health care provider. If you have ever had a severe allergic reaction, your health care provider may give you an inhaler or injectable medicine (epinephrine auto-injector) to use if necessary. Follow these instructions at home:  Wash the sting site 2-3 times each day with soap and water as told by your health care provider.  Apply or take over-the-counter and prescription medicines only as told by your health care provider.  If directed, apply ice to the sting area. ? Put ice in a plastic bag. ? Place a towel between your skin and the bag. ? Leave the ice on for 20 minutes, 2-3 times a day.  Do not scratch the sting  area.  If you had a severe allergic reaction to a sting, you may need: ? To wear a medical bracelet or necklace that lists the allergy. ? To learn when and how to use an anaphylaxis kit or epinephrine injection. Your family members and coworkers may also need to learn this. ? To carry an anaphylaxis kit or epinephrine injection with you at all times. How is this prevented?  Avoid swatting at stinging insects and disturbing insect nests.  Do not use fragrant soaps or lotions.  Wear shoes, pants, and long sleeves when spending time  outdoors, especially in grassy areas where stinging insects are common.  Keep outdoor areas free from nests or hives.  Keep food and drink containers covered when eating outdoors.  Avoid working or sitting near [employer], if possible.  Wear gloves if you are gardening or working outdoors.  If an attack by a stinging insect or a swarm seems likely in the moment, move away from the area or find a barrier between you and the insect(s), such as a door. Contact a health care provider if:  Your symptoms do not get better in 2-3 days.  You have redness, swelling, or pain that spreads beyond the area of the sting.  You have a fever. Get help right away if: You have symptoms of a severe allergic reaction. These include:  Wheezing or difficulty breathing.  Tightness in the chest or chest pain.  Light-headedness or fainting.  Itchy, raised, red patches on the skin.  Nausea or vomiting.  Abdominal cramping.  Diarrhea.  A swollen tongue or lips, or trouble swallowing.  Dizziness or fainting.  Summary  Stings from bees, wasps, and hornets can cause pain and inflammation, but they are usually not serious. However, some people may have an allergic reaction to a sting. This can cause the symptoms to be more severe.  Pay close attention to your symptoms after you have been stung. If possible, have someone stay with you to make sure you do not have an allergic reaction.  Call your health care provider if you have any signs of an allergic reaction. This information is not intended to replace advice given to you by your health care provider. Make sure you discuss any questions you have with your health care provider. Document Released: 06/20/2005 Document Revised: 08/25/2016 Document Reviewed: 08/25/2016 Elsevier Interactive Patient Education  2018 [place], Adult An insect bite can make your skin red, itchy, and swollen. An insect bite is different from an  insect sting, which happens when an insect injects poison (venom) into the skin. Some insects can spread disease to people through a bite. However, most insect bites do not lead to disease and are not serious. What are the causes? Insects may bite for a variety of reasons, including:  Hunger.  To defend themselves.  Insects that bite include:  Spiders.  Mosquitoes.  Ticks.  Fleas.  Ants.  Flies.  Bedbugs.  What are the signs or symptoms? Symptoms of this condition include:  Itching or pain in the bite area.  Redness and swelling in the bite area.  An open wound (skin ulcer).  In many cases, symptoms last for 2-4 days. How is this diagnosed? This condition is usually diagnosed based on symptoms and a physical exam. How is this treated? Treatment is usually not needed. Symptoms often go away on their own. When treatment is recommended, it may involve:  Applying a cream or lotion to the bitten area. This  treatment helps with itching.  Taking an antibiotic medicine. This treatment is needed if the bite area gets infected.  Getting a tetanus shot.  Applying ice to the affected area.  Medicines called antihistamines. This treatment is needed if you develop an allergic reaction to the insect bite.  Follow these instructions at home: Bite area care  Do not scratch the bite area.  Keep the bite area clean and dry. Wash it every day with soap and water as told by your health care provider.  Check the bite area every day for signs of infection. Check for: ? More redness, swelling, or pain. ? Fluid or blood. ? Warmth. ? Pus. Managing pain, itching, and swelling   You may apply a baking soda paste, cortisone cream, or calamine lotion to the bite area as told by your health care provider.  If directed, applyice to the bite area. ? Put ice in a plastic bag. ? Place a towel between your skin and the bag. ? Leave the ice on for 20 minutes, 2-3 times per  day. Medicines  Apply or take over-the-counter and prescription medicines only as told by your health care provider.  If you were prescribed an antibiotic medicine, use it as told by your health care provider. Do not stop using the antibiotic even if your condition improves. General instructions  Keep all follow-up visits as told by your health care provider. This is important. How is this prevented? To help reduce your risk of insect bites:  When you are outdoors, wear clothing that covers your arms and legs.  Use insect repellent. The best insect repellents contain: ? DEET, picaridin, oil of lemon eucalyptus (OLE), or IR3535. ? Higher amounts of an active ingredient.  If your home windows do not have screens, consider installing them.  Contact a health care provider if:  You have more redness, swelling, or pain in the bite area.  You have fluid, blood, or pus coming from the bite area.  The bite area feels warm to the touch.  You have a fever. Get help right away if:  You have joint pain.  You have a rash.  You have shortness of breath.  You feel unusually tired or sleepy.  You have neck pain.  You have a headache.  You have unusual weakness.  You have chest pain.  You have nausea, vomiting, or pain in the abdomen. This information is not intended to replace advice given to you by your health care provider. Make sure you discuss any questions you have with your health care provider. Document Released: 07/28/2004 Document Revised: 02/17/2016 Document Reviewed: 12/28/2015 Elsevier Interactive Patient Education  [employer].

## 2017-11-23 NOTE — Progress Notes (Signed)
Patient ID: Vester Balthazor, male   DOB: 07/06/1970, 47 y.o.   MRN: [id number]     Subjective:  I acted as a Education administrator for Dr. Carollee Herter.  Guerry Bruin, Commodore   Patient ID: Norm Wray, male    DOB: Jul 06, 1970, 47 y.o.   MRN: [id number]  Chief Complaint  Patient presents with  . Insect Bite    left middle finger    HPI  Patient is in today for bee sting left middle finger.  Was stung yesterday.  He took benadryl yesterday and one today.  Now left hand is swollen.-  It is worse than yesterday.  No sob, no cp  Patient Care Team: Mosie Lukes, MD as PCP - General (Family Medicine)   Past Medical History:  Diagnosis Date  . Arthritis   . Chicken pox   . H/O tobacco use, presenting hazards to health 07/31/2011  . Headache, cluster, episodic    hx over a yr  . Injury of left shoulder 01/30/2013  . Low testosterone   . Nasal polyp 11/18/2013  . Other and unspecified hyperlipidemia 11/18/2013  . Overweight(278.02)   . Preventative health care 11/18/2013  . Radiculopathy affecting upper extremity 11/20/2011  . Tobacco use disorder 07/31/2011    Past Surgical History:  Procedure Laterality Date  . APPENDECTOMY    . BICEPS TENDON REPAIR  2012   right  . CARPAL TUNNEL RELEASE  2013   right  . KNEE SURGERY Right 2013   acl  . NASAL SINUS SURGERY  2016  . 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 TOTAL KNEE ARTHROPLASTY Right 02/19/2016   Procedure: RIGHT TOTAL KNEE ARTHROPLASTY;  Surgeon: Dorna Leitz, MD;  Location: [facility];  Service: Orthopedics;  Laterality: Right;    Family History  Problem Relation Age of Onset  . Cancer Mother        leukemia, pancreatic cancer  . Cancer Maternal Aunt        lung  . Cancer Maternal Grandfather        colon   . Cholelithiasis Sister     Social History   Socioeconomic History  . Marital status: Married    Spouse name: Not on file  . Number of children: Not on file  . Years of education: Not on file  . Highest education level: Not on file   Occupational History  . Not on file  Social Needs  . Financial resource strain: Not on file  . Food insecurity:    Worry: Not on file    Inability: Not on file  . Transportation needs:    Medical: Not on file    Non-medical: Not on file  Tobacco Use  . Smoking status: Former Smoker    Packs/day: 0.25    Years: 3.00    Pack years: 0.75    Last attempt to quit: 01/31/2012    Years since quitting: 5.8  . Smokeless tobacco: Never Used  Substance and Sexual Activity  . Alcohol use: Yes    Alcohol/week: 8.4 oz    Types: 14 Cans of beer per week  . Drug use: No  . Sexual activity: Yes    Partners: Female    Comment: lives with wife, works for [employer], no dietary restrictions.  Lifestyle  . Physical activity:    Days per week: Not on file    Minutes per session: Not on file  . Stress: Not on file  Relationships  . Social connections:    Talks on  phone: Not on file    Gets together: Not on file    Attends religious service: Not on file    Active member of club or organization: Not on file    Attends meetings of clubs or organizations: Not on file    Relationship status: Not on file  . Intimate partner violence:    Fear of current or ex partner: Not on file    Emotionally abused: Not on file    Physically abused: Not on file    Forced sexual activity: Not on file  Other Topics Concern  . Not on file  Social History Narrative  . Not on file    Outpatient Medications Prior to Visit  Medication Sig Dispense Refill  . cetirizine (ZYRTEC) 10 MG tablet Take 1 tablet (10 mg total) by mouth 2 (two) times daily. (Patient taking differently: Take 10 mg by mouth daily. ) 60 tablet 11  . meloxicam (MOBIC) 15 MG tablet TAKE 1 TABLET BY MOUTH EVERY DAY AS NEEDED WITH FOOD 30 tablet 2  . rosuvastatin (CRESTOR) 40 MG tablet Take 1 tablet (40 mg total) by mouth daily. 90 tablet 3  . testosterone cypionate (DEPOTESTOSTERONE CYPIONATE) 200 MG/ML injection Inject 1  mL (200 mg total) into the muscle every 14 (fourteen) days. 10 mL 2   No facility-administered medications prior to visit.     Allergies  Allergen Reactions  . Imitrex [Sumatriptan]     Swelling around eyes and SOb  . Penicillins     Has patient had a PCN reaction causing immediate rash, facial/tongue/throat swelling, SOB or lightheadedness with hypotension: {unknown Has patient had a PCN reaction causing severe rash involving mucus membranes or skin necrosis: {unknown Has patient had a PCN reaction that required hospitalization {unknwon Has patient had a PCN reaction occurring within the last 10 years: no If all of the above answers are "NO", then may proceed with Cephalosporin use.    Review of Systems  Constitutional: Negative for chills, fever and malaise/fatigue.  HENT: Negative for congestion and hearing loss.   Eyes: Negative for discharge.  Respiratory: Negative for cough, sputum production and shortness of breath.   Cardiovascular: Negative for chest pain, palpitations and leg swelling.  Gastrointestinal: Negative for abdominal pain, blood in stool, constipation, diarrhea, heartburn, nausea and vomiting.  Genitourinary: Negative for dysuria, frequency, hematuria and urgency.  Musculoskeletal: Positive for joint pain. Negative for back pain, falls and myalgias.  Skin: Negative for rash.  Neurological: Negative for dizziness, sensory change, loss of consciousness, weakness and headaches.  Endo/Heme/Allergies: Negative for environmental allergies. Does not bruise/bleed easily.  Psychiatric/Behavioral: Negative for depression and suicidal ideas. The patient is not nervous/anxious and does not have insomnia.        Objective:    Physical Exam  Constitutional: He is oriented to person, place, and time. Vital signs are normal. He appears well-developed and well-nourished. He is sleeping.  HENT:  Head: Normocephalic and atraumatic.  Mouth/Throat: Oropharynx is clear and moist.   Eyes: Pupils are equal, round, and reactive to light. EOM are normal.  Neck: Normal range of motion. Neck supple. No thyromegaly present.  Cardiovascular: Normal rate and regular rhythm.  No murmur heard. Pulmonary/Chest: Effort normal and breath sounds normal. No respiratory distress. He has no wheezes. He has no rales. He exhibits no tenderness.  Musculoskeletal: He exhibits edema and tenderness.       Right hand: He exhibits no tenderness.       Left hand: He exhibits tenderness.  Hands: Neurological: He is alert and oriented to person, place, and time.  Skin: Skin is warm and dry. There is erythema.  Psychiatric: He has a normal mood and affect. His behavior is normal. Judgment and thought content normal.  Nursing note and vitals reviewed.   BP 118/80 (BP Location: Right Arm, Cuff Size: Large)   Pulse 81   Temp 98.2 F (36.8 C) (Oral)   Resp 16   Ht 5\' 9"  (1.753 m)   Wt 218 lb 6.4 oz (99.1 kg)   SpO2 98%   BMI 32.25 kg/m  Wt Readings from Last 3 Encounters:  11/23/17 218 lb 6.4 oz (99.1 kg)  09/26/17 217 lb 8 oz (98.7 kg)  12/20/16 203 lb 9.6 oz (92.4 kg)   BP Readings from Last 3 Encounters:  11/23/17 118/80  09/26/17 124/72  12/20/16 102/68       Immunization History  Administered Date(s) Administered  . Tdap 11/23/2017    Health Maintenance  Topic Date Due  . HIV Screening  07/27/1986  . TETANUS/TDAP  07/27/1990  . INFLUENZA VACCINE  02/01/2018    Lab Results  Component Value Date   WBC 6.7 12/20/2016   HGB 15.5 12/20/2016   HCT 45.9 12/20/2016   PLT 313.0 12/20/2016   GLUCOSE 77 12/20/2016   CHOL 178 12/20/2016   TRIG 128.0 12/20/2016   HDL 58.90 12/20/2016   LDLDIRECT 146.0 09/15/2016   LDLCALC 94 12/20/2016   ALT 19 12/20/2016   AST 25 12/20/2016   NA 138 12/20/2016   K 4.6 12/20/2016   CL 102 12/20/2016   CREATININE 0.84 12/20/2016   BUN 23 12/20/2016   CO2 29 12/20/2016   TSH 2.18 12/20/2016   PSA 0.74 07/01/2015   INR 1.01  02/08/2016    Lab Results  Component Value Date   TSH 2.18 12/20/2016   Lab Results  Component Value Date   WBC 6.7 12/20/2016   HGB 15.5 12/20/2016   HCT 45.9 12/20/2016   MCV 96.5 12/20/2016   PLT 313.0 12/20/2016   Lab Results  Component Value Date   NA 138 12/20/2016   K 4.6 12/20/2016   CO2 29 12/20/2016   GLUCOSE 77 12/20/2016   BUN 23 12/20/2016   CREATININE 0.84 12/20/2016   BILITOT 0.6 12/20/2016   ALKPHOS 44 12/20/2016   AST 25 12/20/2016   ALT 19 12/20/2016   PROT 7.1 12/20/2016   ALBUMIN 4.5 12/20/2016   CALCIUM 9.8 12/20/2016   ANIONGAP 10 02/08/2016   GFR 104.84 12/20/2016   Lab Results  Component Value Date   CHOL 178 12/20/2016   Lab Results  Component Value Date   HDL 58.90 12/20/2016   Lab Results  Component Value Date   LDLCALC 94 12/20/2016   Lab Results  Component Value Date   TRIG 128.0 12/20/2016   Lab Results  Component Value Date   CHOLHDL 3 12/20/2016   No results found for: HGBA1C       Assessment & Plan:   Problem List Items Addressed This Visit      Unprioritized   Bee sting reaction - Primary    pred taper abx  rto or go to er if redness worsens       Relevant Medications   predniSONE (DELTASONE) 10 MG tablet   clindamycin (CLEOCIN) 300 MG capsule   Cellulitis of left upper extremity    abx per orders      Relevant Medications   predniSONE (DELTASONE) 10 MG tablet  clindamycin (CLEOCIN) 300 MG capsule    Other Visit Diagnoses    Need for Tdap vaccination       Relevant Orders   Tdap vaccine greater than or equal to 7yo IM (Completed)      I am having Cline Cools start on predniSONE and clindamycin. I am also having him maintain his cetirizine, rosuvastatin, testosterone cypionate, and meloxicam.  Meds ordered this encounter  Medications  . predniSONE (DELTASONE) 10 MG tablet    Sig: TAKE 3 TABLETS PO QD FOR 3 DAYS THEN TAKE 2 TABLETS PO QD FOR 3 DAYS THEN TAKE 1 TABLET PO QD FOR 3 DAYS THEN  TAKE 1/2 TAB PO QD FOR 3 DAYS    Dispense:  20 tablet    Refill:  0  . clindamycin (CLEOCIN) 300 MG capsule    Sig: Take 1 capsule (300 mg total) by mouth 4 (four) times daily.    Dispense:  40 capsule    Refill:  0    CMA served as scribe during this visit. History, Physical and Plan performed by medical provider. Documentation and orders reviewed and attested to.  Ann Held, DO

## 2017-11-23 NOTE — Assessment & Plan Note (Signed)
abx per orders  

## 2017-11-23 NOTE — Assessment & Plan Note (Signed)
pred taper abx  rto or go to er if redness worsens

## 2017-12-26 ENCOUNTER — Other Ambulatory Visit: Payer: Self-pay | Admitting: Family Medicine

## 2018-02-01 ENCOUNTER — Other Ambulatory Visit: Payer: Self-pay | Admitting: Family Medicine

## 2018-02-01 DIAGNOSIS — E349 Endocrine disorder, unspecified: Secondary | ICD-10-CM

## 2018-02-05 NOTE — Telephone Encounter (Signed)
Pt requesting testosterone injection last Rx 04/03/17. Please advise.

## 2018-02-05 NOTE — Telephone Encounter (Signed)
He can have refill if he had a test in past year and it was normal

## 2018-02-06 ENCOUNTER — Encounter: Payer: Self-pay | Admitting: Family Medicine

## 2018-02-07 ENCOUNTER — Encounter: Payer: Self-pay | Admitting: Family Medicine

## 2018-02-07 NOTE — Telephone Encounter (Signed)
Sent pt MyChart message asking him to call the office to schedule appt with Dr. Charlett Blake since it's been over a year since his testosterone level was last checked.

## 2018-02-07 NOTE — Telephone Encounter (Signed)
Last testosterone test was done 12/20/16, was normal but has been over a year.

## 2018-02-07 NOTE — Telephone Encounter (Signed)
See previous note. He can have the testosterone refill once his test to check his testosterone is scheduled. He has not had his testosterone level checked in over a year so I cannot prescribe again til I know his level

## 2018-02-08 NOTE — Telephone Encounter (Signed)
Can you schedule patient for a follow up visit   Thanks

## 2018-02-08 NOTE — Telephone Encounter (Signed)
Scheduled patient for follow up visit but he was concerned about not having enough medicine until the visit. He said that she might want to check his levels beforehand so he can get his prescription. Will you either call patient in a prescription until he sees Dr. Charlett Blake on 03/06/18 and/or check his labs for his testosterone?

## 2018-02-08 NOTE — Telephone Encounter (Signed)
Patient states that Dr. Charlett Blake might want to check his levels beforehand so he can get his prescription. Will you either call patient in a prescription until he sees Dr. Charlett Blake on 03/06/18 and/or check his labs for his testosterone?    Patient last labs for testosterone 12/20/16  Please advise

## 2018-02-19 ENCOUNTER — Encounter: Payer: Self-pay | Admitting: Medical

## 2018-02-19 ENCOUNTER — Ambulatory Visit: Payer: BLUE CROSS/BLUE SHIELD | Admitting: Medical

## 2018-02-19 ENCOUNTER — Telehealth: Payer: Self-pay | Admitting: Medical

## 2018-02-19 VITALS — BP 125/89 | HR 85 | Temp 98.2°F | Resp 16 | Ht 69.0 in | Wt 217.6 lb

## 2018-02-19 DIAGNOSIS — T7840XA Allergy, unspecified, initial encounter: Secondary | ICD-10-CM | POA: Diagnosis not present

## 2018-02-19 MED ORDER — ROSUVASTATIN CALCIUM 40 MG PO TABS: 40.0000 mg | ORAL_TABLET | Freq: Every day | ORAL | 3 refills | Status: DC

## 2018-02-19 MED ORDER — DOXYCYCLINE HYCLATE 100 MG PO TABS: 100.0000 mg | ORAL_TABLET | Freq: Two times a day (BID) | ORAL | 0 refills | Status: DC

## 2018-02-19 MED ORDER — METHYLPREDNISOLONE ACETATE 80 MG/ML IJ SUSP
80.0000 mg | Freq: Once | INTRAMUSCULAR | Status: AC
  Administered 2018-02-19: 80 mg via INTRAMUSCULAR

## 2018-02-19 MED ORDER — PREDNISONE 10 MG PO TABS: ORAL_TABLET | ORAL | 0 refills | Status: DC

## 2018-02-19 MED FILL — ROSUVASTATIN CALCIUM 40 MG: 40 | 30 days supply | Qty: 30 | Fill #0

## 2018-02-19 MED FILL — predniSONE 10 MG TABS: 10 | 6 days supply | Qty: 21 | Fill #0

## 2018-02-19 NOTE — Progress Notes (Addendum)
Subjective:    Patient ID: Anthony Reese, male    DOB: 02-26-1971, 47 y.o.   MRN: [id number]  HPI  Pt got stung by something cutting grass. Pt describes that near his temporal area/forehead junction above his left eye brow(sharp pain followed by severe swelling). He is not sure what it was but describes very likely honey bee. He had very swollen left hand this past year and again earlier in year when stung by hone bee. He states felt the same and had similar reaction this time.  No fever, no chills or sweats.  No sob or wheezing. No description of syncope or hypotension. Does not describe anaphylactic type reaction  This morning when woke his eye is more swollen .    Review of Systems  Constitutional: Negative for chills, fatigue and fever.  Eyes:       Swelling around left eye.  Respiratory: Negative for cough, chest tightness, shortness of breath and wheezing.   Cardiovascular: Negative for chest pain and palpitations.  Gastrointestinal: Negative for abdominal pain.  Genitourinary: Negative for dysuria, enuresis, frequency and genital sores.  Musculoskeletal: Negative for back pain.  Neurological: Negative for dizziness and headaches.  Hematological: Negative for adenopathy. Does not bruise/bleed easily.  Psychiatric/Behavioral: Negative for behavioral problems.    Past Medical History:  Diagnosis Date  . Arthritis   . Chicken pox   . H/O tobacco use, presenting hazards to health 07/31/2011  . Headache, cluster, episodic    hx over a yr  . Injury of left shoulder 01/30/2013  . Low testosterone   . Nasal polyp 11/18/2013  . Other and unspecified hyperlipidemia 11/18/2013  . Overweight(278.02)   . Preventative health care 11/18/2013  . Radiculopathy affecting upper extremity 11/20/2011  . Tobacco use disorder 07/31/2011     Social History   Socioeconomic History  . Marital status: Married    Spouse name: Not on file  . Number of children: Not on file  . Years of  education: Not on file  . Highest education level: Not on file  Occupational History  . Not on file  Social Needs  . Financial resource strain: Not on file  . Food insecurity:    Worry: Not on file    Inability: Not on file  . Transportation needs:    Medical: Not on file    Non-medical: Not on file  Tobacco Use  . Smoking status: Former Smoker    Packs/day: 0.25    Years: 3.00    Pack years: 0.75    Last attempt to quit: 01/31/2012    Years since quitting: 6.0  . Smokeless tobacco: Never Used  Substance and Sexual Activity  . Alcohol use: Yes    Alcohol/week: 14.0 standard drinks    Types: 14 Cans of beer per week  . Drug use: No  . Sexual activity: Yes    Partners: Female    Comment: lives with wife, works for [employer], no dietary restrictions.  Lifestyle  . Physical activity:    Days per week: Not on file    Minutes per session: Not on file  . Stress: Not on file  Relationships  . Social connections:    Talks on phone: Not on file    Gets together: Not on file    Attends religious service: Not on file    Active member of club or organization: Not on file    Attends meetings of clubs or organizations: Not on file  Relationship status: Not on file  . Intimate partner violence:    Fear of current or ex partner: Not on file    Emotionally abused: Not on file    Physically abused: Not on file    Forced sexual activity: Not on file  Other Topics Concern  . Not on file  Social History Narrative  . Not on file    Past Surgical History:  Procedure Laterality Date  . APPENDECTOMY    . BICEPS TENDON REPAIR  2012   right  . CARPAL TUNNEL RELEASE  2013   right  . KNEE SURGERY Right 2013   acl  . NASAL SINUS SURGERY  2016  . 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 TOTAL KNEE ARTHROPLASTY Right 02/19/2016   Procedure: RIGHT TOTAL KNEE ARTHROPLASTY;  Surgeon: Dorna Leitz, MD;  Location: [facility];  Service: Orthopedics;  Laterality: Right;      Family History  Problem Relation Age of Onset  . Cancer Mother        leukemia, pancreatic cancer  . Cancer Maternal Aunt        lung  . Cancer Maternal Grandfather        colon   . Cholelithiasis Sister     Allergies  Allergen Reactions  . Imitrex [Sumatriptan]     Swelling around eyes and SOb  . Penicillins     Has patient had a PCN reaction causing immediate rash, facial/tongue/throat swelling, SOB or lightheadedness with hypotension: {unknown Has patient had a PCN reaction causing severe rash involving mucus membranes or skin necrosis: {unknown Has patient had a PCN reaction that required hospitalization {unknwon Has patient had a PCN reaction occurring within the last 10 years: no If all of the above answers are "NO", then may proceed with Cephalosporin use.    Current Outpatient Medications on File Prior to Visit  Medication Sig Dispense Refill  . cetirizine (ZYRTEC) 10 MG tablet Take 1 tablet (10 mg total) by mouth 2 (two) times daily. (Patient taking differently: Take 10 mg by mouth daily. ) 60 tablet 11  . clindamycin (CLEOCIN) 300 MG capsule Take 1 capsule (300 mg total) by mouth 4 (four) times daily. 40 capsule 0  . meloxicam (MOBIC) 15 MG tablet TAKE 1 TABLET BY MOUTH EVERY DAY AS NEEDED WITH FOOD 30 tablet 2  . rosuvastatin (CRESTOR) 40 MG tablet Take 1 tablet (40 mg total) by mouth daily. 90 tablet 3  . testosterone cypionate (DEPOTESTOSTERONE CYPIONATE) 200 MG/ML injection Inject 1 mL (200 mg total) into the muscle every 14 (fourteen) days. 10 mL 2   No current facility-administered medications on file prior to visit.     BP 125/89   Pulse 85   Temp 98.2 F (36.8 C) (Oral)   Resp 16   Ht 5\' 9"  (1.753 m)   Wt 217 lb 9.6 oz (98.7 kg)   SpO2 99%   BMI 32.13 kg/m       Objective:   Physical Exam  General  Mental Status - Alert. General Appearance - Well groomed. Not in acute distress.  Skin Rashes- No Rashes. Faint pinkish appearance around eye.  Some extends to mid forehead. Not real warm or swollen on forehead. No induration or fluctuance. No blister or vesicles seen on face.  HEENT Head- Normal. Ear Auditory Canal - Left- Normal. Right - Normal.Tympanic Membrane- Left- Normal. Right- Normal. Eye Sclera/Conjunctiva- Left- Normal. Right- Normal. Around left eye upper and lower lid swelling. Can open eye and conjuntiva looks  clear. No dc. No redness in eye. Nose & Sinuses Nasal Mucosa- Left-  Boggy and Congested. Right-  Boggy and  Congested.Bilateral no  maxillary and no  frontal sinus pressure. Mouth & Throat Lips: Upper Lip- Normal: no dryness, cracking, pallor, cyanosis, or vesicular eruption. Lower Lip-Normal: no dryness, cracking, pallor, cyanosis or vesicular eruption. Buccal Mucosa- Bilateral- No Aphthous ulcers. Oropharynx- No Discharge or Erythema. Tonsils: Characteristics- Bilateral- No Erythema or Congestion. Size/Enlargement- Bilateral- No enlargement. Discharge- bilateral-None.  Neck Neck- Supple. No Masses.   Chest and Lung Exam Auscultation: Breath Sounds:-Clear even and unlabored.  Cardiovascular Auscultation:Rythm- Regular, rate and rhythm. Murmurs & Other Heart Sounds:Ausculatation of the heart reveal- No Murmurs.  Lymphatic Head & Neck General Head & Neck Lymphatics: Bilateral: Description- No Localized lymphadenopathy.       Assessment & Plan:  For moderate to severe allergic reaction, we gave you depomedrol 80 mg im today and want you to start 6 day taper prednisone. For itching can continue zyrtec tab.  If you have signs and symptoms of secondary infection then can start doxycycline if needed(Rx advisement explained).  Follow up 4-5 day if swelling linger or as needed.  If progressive improvement then follow up may not be needed  Pt mentioned during interview he needed new rx of crestor. So I sent it in.  Mackie Pai, PA-C

## 2018-02-19 NOTE — Addendum Note (Signed)
Addended by: Anabel Halon on: 02/19/2018 12:21 PM   Modules accepted: Orders

## 2018-02-19 NOTE — Addendum Note (Signed)
Addended by: Hinton Dyer on: 02/19/2018 01:17 PM   Modules accepted: Orders

## 2018-02-19 NOTE — Patient Instructions (Addendum)
For moderate to severe allergic reaction, we gave you depomedrol 80 mg im today and want you to start 6 day taper prednisone. For itching can continue zyrtec tab.  If you have signs and symptoms of secondary infection then can start doxycycline if needed(Rx advisement explained).  Also can apply old compress twice daily.  Follow up 4-5 day if swelling linger or as needed.  If progressive improvement then follow up may not be needed

## 2018-02-19 NOTE — Telephone Encounter (Signed)
Opened to review 

## 2018-02-21 ENCOUNTER — Telehealth: Payer: Self-pay | Admitting: Medical

## 2018-02-21 NOTE — Telephone Encounter (Signed)
I printed the letter for patient last night. Will you check and see if I was succesful. If not will you print and then I will sign.

## 2018-02-21 NOTE — Telephone Encounter (Signed)
Copied from [place] [id number]. Topic: Quick Communication - See Telephone Encounter >> Feb 21, 2018  2:20 PM Rosalin Hawking wrote: CRM for notification. See Telephone encounter for: 02/21/18.    Pt came in office stating was seen by provider on 02-19-2018 since he was stung by a bee mowing the lawn, pt states has [facility] insurance and is needing a letter mentioning that pt had an allergy reaction from the stung. Letter needs to say that pt got an allergy reaction by the stung from the bee while mowing the lawn,(pt was out of work 1/2 day since had to come to see the doctor) pt is needing letter ASAP. Please advise.

## 2018-03-06 ENCOUNTER — Ambulatory Visit: Payer: BLUE CROSS/BLUE SHIELD | Admitting: Family Medicine

## 2018-03-06 VITALS — BP 118/82 | HR 94 | Temp 97.9°F | Resp 18 | Ht 68.0 in | Wt 218.6 lb

## 2018-03-06 DIAGNOSIS — N529 Male erectile dysfunction, unspecified: Secondary | ICD-10-CM | POA: Diagnosis not present

## 2018-03-06 DIAGNOSIS — M25561 Pain in right knee: Secondary | ICD-10-CM | POA: Insufficient documentation

## 2018-03-06 DIAGNOSIS — M25562 Pain in left knee: Secondary | ICD-10-CM | POA: Diagnosis not present

## 2018-03-06 DIAGNOSIS — E349 Endocrine disorder, unspecified: Secondary | ICD-10-CM | POA: Diagnosis not present

## 2018-03-06 DIAGNOSIS — E663 Overweight: Secondary | ICD-10-CM

## 2018-03-06 DIAGNOSIS — Z23 Encounter for immunization: Secondary | ICD-10-CM | POA: Diagnosis not present

## 2018-03-06 DIAGNOSIS — E785 Hyperlipidemia, unspecified: Secondary | ICD-10-CM | POA: Diagnosis not present

## 2018-03-06 MED ORDER — TESTOSTERONE CYPIONATE 200 MG/ML IM SOLN: 200.0000 mg | INTRAMUSCULAR | 2 refills | Status: DC

## 2018-03-06 MED FILL — TESTOSTERONE CYP 200 MG/ML: 200 | 28 days supply | Qty: 2 | Fill #0

## 2018-03-06 NOTE — Patient Instructions (Signed)

## 2018-03-06 NOTE — Assessment & Plan Note (Signed)
Encouraged moist heat and gentle stretching as tolerated. May try NSAIDs and prescription meds as directed and report if symptoms worsen or seek immediate care 

## 2018-03-07 LAB — LIPID PANEL
CHOL/HDL RATIO: 4
Cholesterol: 305 mg/dL — ABNORMAL HIGH (ref 0–200)
HDL: 68.9 mg/dL (ref >39.00)
Triglycerides: 503 mg/dL — ABNORMAL HIGH (ref 0.0–149.0)

## 2018-03-07 LAB — COMPREHENSIVE METABOLIC PANEL
ALT: 35 U/L (ref 0–53)
AST: 29 U/L (ref 0–37)
Albumin: 4.7 g/dL (ref 3.5–5.2)
Alkaline Phosphatase: 56 U/L (ref 39–117)
BUN: 21 mg/dL (ref 6–23)
CHLORIDE: 100 meq/L (ref 96–112)
CO2: 29 meq/L (ref 19–32)
CREATININE: 1.01 mg/dL (ref 0.40–1.50)
Calcium: 9.7 mg/dL (ref 8.4–10.5)
GFR: 84.3 mL/min (ref >60.00)
GLUCOSE: 91 mg/dL (ref 70–99)
POTASSIUM: 4.3 meq/L (ref 3.5–5.1)
Sodium: 139 mEq/L (ref 135–145)
Total Bilirubin: 0.6 mg/dL (ref 0.2–1.2)
Total Protein: 7.7 g/dL (ref 6.0–8.3)

## 2018-03-07 LAB — CBC
HCT: 45.8 % (ref 39.0–52.0)
Hemoglobin: 16 g/dL (ref 13.0–17.0)
MCHC: 34.9 g/dL (ref 30.0–36.0)
MCV: 95.5 fl (ref 78.0–100.0)
PLATELETS: 279 10*3/uL (ref 150.0–400.0)
RBC: 4.79 Mil/uL (ref 4.22–5.81)
RDW: 13.1 % (ref 11.5–15.5)
WBC: 9.6 10*3/uL (ref 4.0–10.5)

## 2018-03-07 LAB — PSA: PSA: 0.63 ng/mL (ref 0.10–4.00)

## 2018-03-07 LAB — TSH: TSH: 1.5 u[IU]/mL (ref 0.35–4.50)

## 2018-03-07 LAB — LDL CHOLESTEROL, DIRECT: LDL DIRECT: 202 mg/dL

## 2018-03-07 LAB — TESTOSTERONE: TESTOSTERONE: 230.19 ng/dL — AB (ref 300.00–890.00)

## 2018-03-08 ENCOUNTER — Encounter: Payer: Self-pay | Admitting: Family Medicine

## 2018-03-11 NOTE — Assessment & Plan Note (Signed)
Encouraged heart healthy diet, increase exercise, avoid trans fats, consider a krill oil cap daily. tolerating Crestor

## 2018-03-11 NOTE — Assessment & Plan Note (Signed)
DASH diet, decrease po intake and increase exercise as tolerated. Needs 7-8 hours of sleep nightly. Avoid trans fats, eat small, frequent meals every 4-5 hours with lean proteins, complex carbs and healthy fats. Minimize simple carbs

## 2018-03-11 NOTE — Assessment & Plan Note (Signed)
Has been supplementing intermittently. No recent dosing. Level today slightly below normal. Consider not supplementing at the present time. Discussed risks and benefits of medication treatements

## 2018-03-11 NOTE — Progress Notes (Signed)
Subjective:    Patient ID: Anthony Reese, male    DOB: Sep 06, 1970, 47 y.o.   MRN: [id number]  No chief complaint on file.   HPI Patient is in today for follow up. He feels well most days. He continues to stuggle with knee pain which limits his ability to exercise. He works long hours and does not always maintain a heart healthy diet. No recent febrile hou or hospitalizations. Denies CP/palp/SOB/HA/congestion/fevers/GI or GU c/o. Taking meds as prescribed  Past Medical History:  Diagnosis Date  . Arthritis   . Chicken pox   . H/O tobacco use, presenting hazards to health 07/31/2011  . Headache, cluster, episodic    hx over a yr  . Injury of left shoulder 01/30/2013  . Low testosterone   . Nasal polyp 11/18/2013  . Other and unspecified hyperlipidemia 11/18/2013  . Overweight(278.02)   . Preventative health care 11/18/2013  . Radiculopathy affecting upper extremity 11/20/2011  . Tobacco use disorder 07/31/2011    Past Surgical History:  Procedure Laterality Date  . APPENDECTOMY    . BICEPS TENDON REPAIR  2012   right  . CARPAL TUNNEL RELEASE  2013   right  . KNEE SURGERY Right 2013   acl  . NASAL SINUS SURGERY  2016  . 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 TOTAL KNEE ARTHROPLASTY Right 02/19/2016   Procedure: RIGHT TOTAL KNEE ARTHROPLASTY;  Surgeon: Dorna Leitz, MD;  Location: [facility];  Service: Orthopedics;  Laterality: Right;    Family History  Problem Relation Age of Onset  . Cancer Mother        leukemia, pancreatic cancer  . Cancer Maternal Aunt        lung  . Cancer Maternal Grandfather        colon   . Cholelithiasis Sister     Social History   Socioeconomic History  . Marital status: Married    Spouse name: Not on file  . Number of children: Not on file  . Years of education: Not on file  . Highest education level: Not on file  Occupational History  . Not on file  Social Needs  . Financial resource strain: Not on file  . Food insecurity:    Worry:  Not on file    Inability: Not on file  . Transportation needs:    Medical: Not on file    Non-medical: Not on file  Tobacco Use  . Smoking status: Former Smoker    Packs/day: 0.25    Years: 3.00    Pack years: 0.75    Last attempt to quit: 01/31/2012    Years since quitting: 6.1  . Smokeless tobacco: Never Used  Substance and Sexual Activity  . Alcohol use: Yes    Alcohol/week: 14.0 standard drinks    Types: 14 Cans of beer per week  . Drug use: No  . Sexual activity: Yes    Partners: Female    Comment: lives with wife, works for [employer], no dietary restrictions.  Lifestyle  . Physical activity:    Days per week: Not on file    Minutes per session: Not on file  . Stress: Not on file  Relationships  . Social connections:    Talks on phone: Not on file    Gets together: Not on file    Attends religious service: Not on file    Active member of club or organization: Not on file    Attends meetings of clubs or organizations:  Not on file    Relationship status: Not on file  . Intimate partner violence:    Fear of current or ex partner: Not on file    Emotionally abused: Not on file    Physically abused: Not on file    Forced sexual activity: Not on file  Other Topics Concern  . Not on file  Social History Narrative  . Not on file    Outpatient Medications Prior to Visit  Medication Sig Dispense Refill  . cetirizine (ZYRTEC) 10 MG tablet Take 1 tablet (10 mg total) by mouth 2 (two) times daily. (Patient taking differently: Take 10 mg by mouth daily. ) 60 tablet 11  . meloxicam (MOBIC) 15 MG tablet TAKE 1 TABLET BY MOUTH EVERY DAY AS NEEDED WITH FOOD 30 tablet 2  . rosuvastatin (CRESTOR) 40 MG tablet Take 1 tablet (40 mg total) by mouth daily. 90 tablet 3  . clindamycin (CLEOCIN) 300 MG capsule Take 1 capsule (300 mg total) by mouth 4 (four) times daily. 40 capsule 0  . doxycycline (VIBRA-TABS) 100 MG tablet Take 1 tablet (100 mg total) by mouth 2  (two) times daily. Can give caps or generic 14 tablet 0  . predniSONE (DELTASONE) 10 MG tablet 6 TAB PO DAY 1 5 TAB PO DAY 2 4 TAB PO DAY 3 3 TAB PO DAY 4 2 TAB PO DAY 5 1 TAB PO DAY 6 21 tablet 0  . testosterone cypionate (DEPOTESTOSTERONE CYPIONATE) 200 MG/ML injection Inject 1 mL (200 mg total) into the muscle every 14 (fourteen) days. 10 mL 2   No facility-administered medications prior to visit.     Allergies  Allergen Reactions  . Imitrex [Sumatriptan]     Swelling around eyes and SOb  . Penicillins     Has patient had a PCN reaction causing immediate rash, facial/tongue/throat swelling, SOB or lightheadedness with hypotension: {unknown Has patient had a PCN reaction causing severe rash involving mucus membranes or skin necrosis: {unknown Has patient had a PCN reaction that required hospitalization {unknwon Has patient had a PCN reaction occurring within the last 10 years: no If all of the above answers are "NO", then may proceed with Cephalosporin use.    Review of Systems  Constitutional: Positive for malaise/fatigue. Negative for fever.  HENT: Negative for congestion.   Eyes: Negative for blurred vision.  Respiratory: Negative for shortness of breath.   Cardiovascular: Negative for chest pain, palpitations and leg swelling.  Gastrointestinal: Negative for abdominal pain, blood in stool and nausea.  Genitourinary: Negative for dysuria and frequency.  Musculoskeletal: Positive for joint pain. Negative for falls.  Skin: Negative for rash.  Neurological: Negative for dizziness, loss of consciousness and headaches.  Endo/Heme/Allergies: Negative for environmental allergies.  Psychiatric/Behavioral: Negative for depression. The patient is not nervous/anxious.        Objective:    Physical Exam  Constitutional: He is oriented to person, place, and time. He appears well-developed and well-nourished. No distress.  HENT:  Head: Normocephalic and atraumatic.  Nose: Nose  normal.  Eyes: Right eye exhibits no discharge. Left eye exhibits no discharge.  Neck: Normal range of motion. Neck supple.  Cardiovascular: Normal rate and regular rhythm.  No murmur heard. Pulmonary/Chest: Effort normal and breath sounds normal.  Abdominal: Soft. Bowel sounds are normal. There is no tenderness.  Musculoskeletal: He exhibits no edema.  Neurological: He is alert and oriented to person, place, and time.  Skin: Skin is warm and dry.  Psychiatric: He has a  normal mood and affect.  Nursing note and vitals reviewed.   BP 118/82 (BP Location: Left Arm, Patient Position: Sitting, Cuff Size: Normal)   Pulse 94   Temp 97.9 F (36.6 C) (Oral)   Resp 18   Ht 5\' 8"  (1.727 m)   Wt 218 lb 9.6 oz (99.2 kg)   SpO2 98%   BMI 33.24 kg/m  Wt Readings from Last 3 Encounters:  03/06/18 218 lb 9.6 oz (99.2 kg)  02/19/18 217 lb 9.6 oz (98.7 kg)  11/23/17 218 lb 6.4 oz (99.1 kg)     Lab Results  Component Value Date   WBC 9.6 03/06/2018   HGB 16.0 03/06/2018   HCT 45.8 03/06/2018   PLT 279.0 03/06/2018   GLUCOSE 91 03/06/2018   CHOL 305 (H) 03/06/2018   TRIG (H) 03/06/2018    503.0 Triglyceride is over 400; calculations on Lipids are invalid.   HDL 68.90 03/06/2018   LDLDIRECT 202.0 03/06/2018   LDLCALC 94 12/20/2016   ALT 35 03/06/2018   AST 29 03/06/2018   NA 139 03/06/2018   K 4.3 03/06/2018   CL 100 03/06/2018   CREATININE 1.01 03/06/2018   BUN 21 03/06/2018   CO2 29 03/06/2018   TSH 1.50 03/06/2018   PSA 0.63 03/06/2018   INR 1.01 02/08/2016    Lab Results  Component Value Date   TSH 1.50 03/06/2018   Lab Results  Component Value Date   WBC 9.6 03/06/2018   HGB 16.0 03/06/2018   HCT 45.8 03/06/2018   MCV 95.5 03/06/2018   PLT 279.0 03/06/2018   Lab Results  Component Value Date   NA 139 03/06/2018   K 4.3 03/06/2018   CO2 29 03/06/2018   GLUCOSE 91 03/06/2018   BUN 21 03/06/2018   CREATININE 1.01 03/06/2018   BILITOT 0.6 03/06/2018   ALKPHOS  56 03/06/2018   AST 29 03/06/2018   ALT 35 03/06/2018   PROT 7.7 03/06/2018   ALBUMIN 4.7 03/06/2018   CALCIUM 9.7 03/06/2018   ANIONGAP 10 02/08/2016   GFR 84.30 03/06/2018   Lab Results  Component Value Date   CHOL 305 (H) 03/06/2018   Lab Results  Component Value Date   HDL 68.90 03/06/2018   Lab Results  Component Value Date   LDLCALC 94 12/20/2016   Lab Results  Component Value Date   TRIG (H) 03/06/2018    503.0 Triglyceride is over 400; calculations on Lipids are invalid.   Lab Results  Component Value Date   CHOLHDL 4 03/06/2018   No results found for: HGBA1C     Assessment & Plan:   Problem List Items Addressed This Visit    Testosterone deficiency    Has been supplementing intermittently. No recent dosing. Level today slightly below normal. Consider not supplementing at the present time. Discussed risks and benefits of medication treatements      Relevant Medications   testosterone cypionate (DEPOTESTOSTERONE CYPIONATE) 200 MG/ML injection   Other Relevant Orders   CBC (Completed)   Comprehensive metabolic panel (Completed)   Testosterone (Completed)   PSA (Completed)   CBC   Comprehensive metabolic panel   TSH   Testosterone   Overweight     DASH diet, decrease po intake and increase exercise as tolerated. Needs 7-8 hours of sleep nightly. Avoid trans fats, eat small, frequent meals every 4-5 hours with lean proteins, complex carbs and healthy fats. Minimize simple carbs      Hyperlipidemia    Encouraged heart healthy  diet, increase exercise, avoid trans fats, consider a krill oil cap daily. tolerating Crestor      Relevant Orders   Lipid panel (Completed)   TSH (Completed)   CBC   Comprehensive metabolic panel   Lipid panel   Knee pain, left    Encouraged moist heat and gentle stretching as tolerated. May try NSAIDs and prescription meds as directed and report if symptoms worsen or seek immediate care       Other Visit Diagnoses     Needs flu shot    -  Primary   Relevant Orders   Flu Vaccine QUAD 6+ mos PF IM (Fluarix Quad PF) (Completed)   Erectile dysfunction, unspecified erectile dysfunction type          I have discontinued Dereck Ligas clindamycin, predniSONE, and doxycycline. I am also having him maintain his cetirizine, meloxicam, rosuvastatin, and testosterone cypionate.  Meds ordered this encounter  Medications  . testosterone cypionate (DEPOTESTOSTERONE CYPIONATE) 200 MG/ML injection    Sig: Inject 1 mL (200 mg total) into the muscle every 14 (fourteen) days.    Dispense:  10 mL    Refill:  2     Penni Homans, MD

## 2018-04-06 MED FILL — TESTOSTERONE CYP 200 MG/ML: 200 | 28 days supply | Qty: 2 | Fill #1

## 2018-05-03 MED FILL — ROSUVASTATIN CALCIUM 40 MG: 40 | 30 days supply | Qty: 30 | Fill #1

## 2018-05-15 MED FILL — TESTOSTERONE CYP 200 MG/ML: 200 | 28 days supply | Qty: 2 | Fill #2

## 2018-05-30 ENCOUNTER — Telehealth: Payer: Self-pay

## 2018-05-30 DIAGNOSIS — E785 Hyperlipidemia, unspecified: Secondary | ICD-10-CM

## 2018-05-30 MED ORDER — ROSUVASTATIN CALCIUM 40 MG PO TABS: 40.0000 mg | ORAL_TABLET | Freq: Every day | ORAL | 3 refills | Status: DC

## 2018-05-30 NOTE — Telephone Encounter (Signed)
[facility] for 90-day supply of rosuvastatin, but pt. already has 3 refills left on rx written on 02/19/18. Author phoned pt. And pt. Stated he needs to [facility] for insurance purposes. [facility]

## 2018-06-06 ENCOUNTER — Encounter: Payer: Self-pay | Admitting: Family Medicine

## 2018-06-07 ENCOUNTER — Encounter: Payer: Self-pay | Admitting: Family Medicine

## 2018-06-14 ENCOUNTER — Telehealth: Payer: Self-pay | Admitting: Family Medicine

## 2018-06-14 DIAGNOSIS — G44019 Episodic cluster headache, not intractable: Secondary | ICD-10-CM

## 2018-06-14 DIAGNOSIS — G44009 Cluster headache syndrome, unspecified, not intractable: Secondary | ICD-10-CM

## 2018-06-14 NOTE — Telephone Encounter (Signed)
Copied from [facility] [id number]. Topic: Quick Communication - See Telephone Encounter >> Jun 14, 2018  4:17 PM Bea Graff, NT wrote: CRM for notification. See Telephone encounter for: 06/14/18. Pt states he called [facility] and they do not have the order for oxygen. Requesting call back to discuss.

## 2018-06-15 NOTE — Telephone Encounter (Signed)
Sent message to patient via my chart  Resent fax to [facility]

## 2018-06-15 NOTE — Addendum Note (Signed)
Addended by: Magdalene Molly A on: 06/15/2018 02:19 PM   Modules accepted: Orders

## 2018-06-18 NOTE — Telephone Encounter (Signed)
Done

## 2018-06-21 ENCOUNTER — Encounter: Payer: Self-pay | Admitting: Family Medicine

## 2018-06-21 ENCOUNTER — Ambulatory Visit: Payer: BLUE CROSS/BLUE SHIELD | Admitting: Family Medicine

## 2018-06-21 VITALS — BP 140/78 | HR 81 | Temp 97.8°F | Ht 68.0 in | Wt 219.0 lb

## 2018-06-21 DIAGNOSIS — S39011A Strain of muscle, fascia and tendon of abdomen, initial encounter: Secondary | ICD-10-CM | POA: Diagnosis not present

## 2018-06-21 MED ORDER — KETOROLAC TROMETHAMINE 60 MG/2ML IM SOLN
60.0000 mg | Freq: Once | INTRAMUSCULAR | Status: AC
  Administered 2018-06-21: 60 mg via INTRAMUSCULAR

## 2018-06-21 MED ORDER — CYCLOBENZAPRINE HCL 10 MG PO TABS: 10.0000 mg | ORAL_TABLET | Freq: Three times a day (TID) | ORAL | 0 refills | Status: DC | PRN

## 2018-06-21 MED FILL — CYCLOBENZAPRINE HCL 10 MG T: 10 | 10 days supply | Qty: 30 | Fill #0

## 2018-06-21 NOTE — Progress Notes (Signed)
Anthony Reese - 47 y.o. male MRN [id number]  Date of birth: 1971/03/13  Subjective Chief Complaint  Patient presents with  . Groin Pain    noticed pain last week-worsened this morning at the gym. Denies bulges.    HPI Anthony Reese is a 47 y.o. male here today with lower abdominal/groin pain.  He reports initial injury occurred a couple of weeks ago while playing lacrosse.  This improved some however he was lifting weight this morning in the gym when pain returned but this time more severe.  Pain described as sharp and is worse with movement.  It is bilateral in nature.  He has not noticed any bulges in the groin area.  He has not had numbness or tingling.   He denies any changes to bowels, testicular pain, urinary symptoms, fever, or chills.  He has not tried anything for treatment so far.   ROS:  A comprehensive ROS was completed and negative except as noted per HPI  Allergies  Allergen Reactions  . Imitrex [Sumatriptan]     Swelling around eyes and SOb  . Penicillins     Has patient had a PCN reaction causing immediate rash, facial/tongue/throat swelling, SOB or lightheadedness with hypotension: {unknown Has patient had a PCN reaction causing severe rash involving mucus membranes or skin necrosis: {unknown Has patient had a PCN reaction that required hospitalization {unknwon Has patient had a PCN reaction occurring within the last 10 years: no If all of the above answers are "NO", then may proceed with Cephalosporin use.    Past Medical History:  Diagnosis Date  . Arthritis   . Chicken pox   . H/O tobacco use, presenting hazards to health 07/31/2011  . Headache, cluster, episodic    hx over a yr  . Injury of left shoulder 01/30/2013  . Low testosterone   . Nasal polyp 11/18/2013  . Other and unspecified hyperlipidemia 11/18/2013  . Overweight(278.02)   . Preventative health care 11/18/2013  . Radiculopathy affecting upper extremity 11/20/2011  . Tobacco use disorder 07/31/2011     Past Surgical History:  Procedure Laterality Date  . APPENDECTOMY    . BICEPS TENDON REPAIR  2012   right  . CARPAL TUNNEL RELEASE  2013   right  . KNEE SURGERY Right 2013   acl  . NASAL SINUS SURGERY  2016  . 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 TOTAL KNEE ARTHROPLASTY Right 02/19/2016   Procedure: RIGHT TOTAL KNEE ARTHROPLASTY;  Surgeon: Dorna Leitz, MD;  Location: [facility];  Service: Orthopedics;  Laterality: Right;    Social History   Socioeconomic History  . Marital status: Married    Spouse name: Not on file  . Number of children: Not on file  . Years of education: Not on file  . Highest education level: Not on file  Occupational History  . Not on file  Social Needs  . Financial resource strain: Not on file  . Food insecurity:    Worry: Not on file    Inability: Not on file  . Transportation needs:    Medical: Not on file    Non-medical: Not on file  Tobacco Use  . Smoking status: Former Smoker    Packs/day: 0.25    Years: 3.00    Pack years: 0.75    Last attempt to quit: 01/31/2012    Years since quitting: 6.3  . Smokeless tobacco: Never Used  Substance and Sexual Activity  . Alcohol use: Yes    Alcohol/week: 14.0  standard drinks    Types: 14 Cans of beer per week  . Drug use: No  . Sexual activity: Yes    Partners: Female    Comment: lives with wife, works for [employer], no dietary restrictions.  Lifestyle  . Physical activity:    Days per week: Not on file    Minutes per session: Not on file  . Stress: Not on file  Relationships  . Social connections:    Talks on phone: Not on file    Gets together: Not on file    Attends religious service: Not on file    Active member of club or organization: Not on file    Attends meetings of clubs or organizations: Not on file    Relationship status: Not on file  Other Topics Concern  . Not on file  Social History Narrative  . Not on file    Family History  Problem  Relation Age of Onset  . Cancer Mother        leukemia, pancreatic cancer  . Cancer Maternal Aunt        lung  . Cancer Maternal Grandfather        colon   . Cholelithiasis Sister     Health Maintenance  Topic Date Due  . HIV Screening  07/27/1986  . TETANUS/TDAP  11/24/2027  . INFLUENZA VACCINE  Completed    ----------------------------------------------------------------------------------------------------------------------------------------------------------------------------------------------------------------- Physical Exam BP 140/78   Pulse 81   Temp 97.8 F (36.6 C) (Oral)   Ht 5\' 8"  (1.727 m)   Wt 219 lb (99.3 kg)   SpO2 97%   BMI 33.30 kg/m   Physical Exam Constitutional:      Appearance: Normal appearance.  HENT:     Head: Normocephalic and atraumatic.  Eyes:     General: No scleral icterus. Cardiovascular:     Rate and Rhythm: Normal rate and regular rhythm.  Pulmonary:     Effort: Pulmonary effort is normal.     Breath sounds: Normal breath sounds.  Abdominal:     General: Bowel sounds are normal. There is no distension.     Tenderness: There is abdominal tenderness (Tenderness across lower abdominal wall.  increased pain with resisted hip flexion bilaterally.  Pain worsened with going from laying to sitting position. ). There is no guarding or rebound.     Hernia: No hernia is present.  Skin:    General: Skin is warm and dry.  Neurological:     General: No focal deficit present.     Mental Status: He is alert.  Psychiatric:        Mood and Affect: Mood normal.        Behavior: Behavior normal.     ------------------------------------------------------------------------------------------------------------------------------------------------------------------------------------------------------------------- Assessment and Plan  Strain of abdominal muscle -Injection of toradol 60mg  given today -He will continue home rx for meloxicam -Add on  flexeril -Instructed to ice at home.  -Avoid heavy lifting for now.  -Call if not improving.

## 2018-06-21 NOTE — Assessment & Plan Note (Signed)
-  Injection of toradol 60mg  given today -He will continue home rx for meloxicam -Add on flexeril -Instructed to ice at home.  -Avoid heavy lifting for now.  -Call if not improving.

## 2018-06-21 NOTE — Patient Instructions (Signed)
-  Ice area 10-15 minutes twice per day -Use meloxicam. -Try muscle relaxer as needed.  Use caution with driving as this may make you drowsy.

## 2018-07-18 ENCOUNTER — Encounter: Payer: Self-pay | Admitting: Family Medicine

## 2018-07-18 ENCOUNTER — Other Ambulatory Visit: Payer: Self-pay | Admitting: Family Medicine

## 2018-07-18 DIAGNOSIS — E349 Endocrine disorder, unspecified: Secondary | ICD-10-CM

## 2018-07-18 MED ORDER — TESTOSTERONE CYPIONATE 200 MG/ML IM SOLN: 200.0000 mg | INTRAMUSCULAR | 1 refills | Status: DC

## 2018-08-31 ENCOUNTER — Other Ambulatory Visit (INDEPENDENT_AMBULATORY_CARE_PROVIDER_SITE_OTHER): Payer: BLUE CROSS/BLUE SHIELD

## 2018-08-31 DIAGNOSIS — E785 Hyperlipidemia, unspecified: Secondary | ICD-10-CM

## 2018-08-31 DIAGNOSIS — E349 Endocrine disorder, unspecified: Secondary | ICD-10-CM | POA: Diagnosis not present

## 2018-08-31 NOTE — Addendum Note (Signed)
Addended by: Caffie Pinto on: 08/31/2018 03:18 PM   Modules accepted: Orders

## 2018-09-01 ENCOUNTER — Encounter: Payer: Self-pay | Admitting: Family Medicine

## 2018-09-01 LAB — LIPID PANEL
Cholesterol: 156 mg/dL (ref <200)
HDL: 56 mg/dL (ref >=40)
LDL Cholesterol (Calc): 73 mg/dL (calc)
Non-HDL Cholesterol (Calc): 100 mg/dL (calc) (ref <130)
Total CHOL/HDL Ratio: 2.8 (calc) (ref <5.0)
Triglycerides: 168 mg/dL — ABNORMAL HIGH (ref <150)

## 2018-09-01 LAB — CBC
HCT: 43.2 % (ref 38.5–50.0)
Hemoglobin: 15.3 g/dL (ref 13.2–17.1)
MCH: 33.2 pg — AB (ref 27.0–33.0)
MCHC: 35.4 g/dL (ref 32.0–36.0)
MCV: 93.7 fL (ref 80.0–100.0)
MPV: 10.1 fL (ref 7.5–12.5)
PLATELETS: 357 10*3/uL (ref 140–400)
RBC: 4.61 10*6/uL (ref 4.20–5.80)
RDW: 12.8 % (ref 11.0–15.0)
WBC: 8.6 10*3/uL (ref 3.8–10.8)

## 2018-09-01 LAB — COMPREHENSIVE METABOLIC PANEL
AG Ratio: 1.8 (calc) (ref 1.0–2.5)
ALT: 30 U/L (ref 9–46)
AST: 25 U/L (ref 10–40)
Albumin: 4.6 g/dL (ref 3.6–5.1)
Alkaline phosphatase (APISO): 56 U/L (ref 36–130)
BUN: 15 mg/dL (ref 7–25)
CHLORIDE: 103 mmol/L (ref 98–110)
CO2: 24 mmol/L (ref 20–32)
CREATININE: 0.88 mg/dL (ref 0.60–1.35)
Calcium: 9.7 mg/dL (ref 8.6–10.3)
GLOBULIN: 2.5 g/dL (ref 1.9–3.7)
Glucose, Bld: 84 mg/dL (ref 65–99)
Potassium: 4.2 mmol/L (ref 3.5–5.3)
Sodium: 140 mmol/L (ref 135–146)
Total Bilirubin: 0.4 mg/dL (ref 0.2–1.2)
Total Protein: 7.1 g/dL (ref 6.1–8.1)

## 2018-09-01 LAB — TESTOSTERONE: Testosterone: 1442 ng/dL — ABNORMAL HIGH (ref 250–827)

## 2018-09-01 LAB — TSH: TSH: 1.2 m[IU]/L (ref 0.40–4.50)

## 2018-09-03 ENCOUNTER — Encounter: Payer: Self-pay | Admitting: Family Medicine

## 2018-09-03 ENCOUNTER — Ambulatory Visit (INDEPENDENT_AMBULATORY_CARE_PROVIDER_SITE_OTHER): Payer: BLUE CROSS/BLUE SHIELD | Admitting: Family Medicine

## 2018-09-03 VITALS — BP 98/64 | HR 105 | Temp 98.2°F | Resp 18 | Ht 68.0 in | Wt 228.0 lb

## 2018-09-03 DIAGNOSIS — R000 Tachycardia, unspecified: Secondary | ICD-10-CM | POA: Insufficient documentation

## 2018-09-03 DIAGNOSIS — E785 Hyperlipidemia, unspecified: Secondary | ICD-10-CM

## 2018-09-03 DIAGNOSIS — M25561 Pain in right knee: Secondary | ICD-10-CM | POA: Diagnosis not present

## 2018-09-03 DIAGNOSIS — R0683 Snoring: Secondary | ICD-10-CM

## 2018-09-03 DIAGNOSIS — M25562 Pain in left knee: Secondary | ICD-10-CM

## 2018-09-03 DIAGNOSIS — E860 Dehydration: Secondary | ICD-10-CM

## 2018-09-03 DIAGNOSIS — E349 Endocrine disorder, unspecified: Secondary | ICD-10-CM

## 2018-09-03 DIAGNOSIS — E66811 Obesity, class 1: Secondary | ICD-10-CM

## 2018-09-03 DIAGNOSIS — E669 Obesity, unspecified: Secondary | ICD-10-CM

## 2018-09-03 DIAGNOSIS — G44019 Episodic cluster headache, not intractable: Secondary | ICD-10-CM

## 2018-09-03 DIAGNOSIS — Z0000 Encounter for general adult medical examination without abnormal findings: Secondary | ICD-10-CM

## 2018-09-03 MED ORDER — MELOXICAM 15 MG PO TABS: 15.0000 mg | ORAL_TABLET | Freq: Every day | ORAL | 1 refills | Status: DC | PRN

## 2018-09-03 MED ORDER — MELOXICAM 15 MG PO TABS: 15.0000 mg | ORAL_TABLET | Freq: Every day | ORAL | 3 refills | Status: DC | PRN

## 2018-09-03 NOTE — Progress Notes (Signed)
Subjective:    Patient ID: Anthony Reese, male    DOB: [birth year], 48 y.o.   MRN: [id number]  No chief complaint on file.   HPI Patient is in today for annual preventative exam and follow-up on chronic medical concerns including hyperlipidemia, knee pain and low testosterone.  No recent febrile illness or hospitalization but he continues to have significant pain in his right knee with swelling in the posterior knee persistent despite a total knee replacement.  He uses Mobic intermittently with some relief.  No recent fall or injury.  Work continues to be stressful with long hours and he gets inadequate sleep.  He endorses restless sleep, snoring, headaches.  He manages his activities of daily living well but he notes he has a lot of myalgias and muscle strains especially with exertion. No regular exercise, Denies CP/palp/SOB/congestion/fevers/GI or GU c/o. Taking meds as prescribed.    Past Medical History:  Diagnosis Date  . Arthritis   . Chicken pox   . H/O tobacco use, presenting hazards to health 07/31/2011  . Headache, cluster, episodic    hx over a yr  . Injury of left shoulder 01/30/2013  . Low testosterone   . Nasal polyp 11/18/2013  . Other and unspecified hyperlipidemia 11/18/2013  . Overweight(278.02)   . Preventative health care 11/18/2013  . Radiculopathy affecting upper extremity 11/20/2011  . Tobacco use disorder 07/31/2011    Past Surgical History:  Procedure Laterality Date  . APPENDECTOMY    . BICEPS TENDON REPAIR  2012   right  . CARPAL TUNNEL RELEASE  2013   right  . KNEE SURGERY Right 2013   acl  . NASAL SINUS SURGERY  2016  . 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 TOTAL KNEE ARTHROPLASTY Right 02/19/2016   Procedure: RIGHT TOTAL KNEE ARTHROPLASTY;  Surgeon: Dorna Leitz, MD;  Location: [facility];  Service: Orthopedics;  Laterality: Right;    Family History  Problem Relation Age of Onset  . Cancer Mother        leukemia, pancreatic cancer  . Cancer  Maternal Aunt        lung  . Cancer Maternal Grandfather        colon   . Cholelithiasis Sister     Social History   Socioeconomic History  . Marital status: Married    Spouse name: Not on file  . Number of children: Not on file  . Years of education: Not on file  . Highest education level: Not on file  Occupational History  . Not on file  Social Needs  . Financial resource strain: Not on file  . Food insecurity:    Worry: Not on file    Inability: Not on file  . Transportation needs:    Medical: Not on file    Non-medical: Not on file  Tobacco Use  . Smoking status: Former Smoker    Packs/day: 0.25    Years: 3.00    Pack years: 0.75    Last attempt to quit: 01/31/2012    Years since quitting: 6.5  . Smokeless tobacco: Never Used  Substance and Sexual Activity  . Alcohol use: Yes    Alcohol/week: 14.0 standard drinks    Types: 14 Cans of beer per week  . Drug use: No  . Sexual activity: Yes    Partners: Female    Comment: lives with wife, works for [employer], no dietary restrictions.  Lifestyle  . Physical activity:    Days per week:  Not on file    Minutes per session: Not on file  . Stress: Not on file  Relationships  . Social connections:    Talks on phone: Not on file    Gets together: Not on file    Attends religious service: Not on file    Active member of club or organization: Not on file    Attends meetings of clubs or organizations: Not on file    Relationship status: Not on file  . Intimate partner violence:    Fear of current or ex partner: Not on file    Emotionally abused: Not on file    Physically abused: Not on file    Forced sexual activity: Not on file  Other Topics Concern  . Not on file  Social History Narrative  . Not on file    Outpatient Medications Prior to Visit  Medication Sig Dispense Refill  . cetirizine (ZYRTEC) 10 MG tablet Take 1 tablet (10 mg total) by mouth 2 (two) times daily. (Patient taking  differently: Take 10 mg by mouth daily. ) 60 tablet 11  . cyclobenzaprine (FLEXERIL) 10 MG tablet Take 1 tablet (10 mg total) by mouth 3 (three) times daily as needed for muscle spasms. 30 tablet 0  . rosuvastatin (CRESTOR) 40 MG tablet Take 1 tablet (40 mg total) by mouth daily. 90 tablet 3  . testosterone cypionate (DEPOTESTOSTERONE CYPIONATE) 200 MG/ML injection Inject 1 mL (200 mg total) into the muscle every 14 (fourteen) days. 10 mL 1  . meloxicam (MOBIC) 15 MG tablet TAKE 1 TABLET BY MOUTH EVERY DAY AS NEEDED WITH FOOD 30 tablet 2   No facility-administered medications prior to visit.     Allergies  Allergen Reactions  . Imitrex [Sumatriptan]     Swelling around eyes and SOb  . Penicillins     Has patient had a PCN reaction causing immediate rash, facial/tongue/throat swelling, SOB or lightheadedness with hypotension: {unknown Has patient had a PCN reaction causing severe rash involving mucus membranes or skin necrosis: {unknown Has patient had a PCN reaction that required hospitalization {unknwon Has patient had a PCN reaction occurring within the last 10 years: no If all of the above answers are "NO", then may proceed with Cephalosporin use.    Review of Systems  Constitutional: Positive for malaise/fatigue. Negative for fever.  HENT: Negative for congestion.   Eyes: Negative for blurred vision.  Respiratory: Negative for shortness of breath.   Cardiovascular: Negative for chest pain, palpitations and leg swelling.  Gastrointestinal: Negative for abdominal pain, blood in stool and nausea.  Genitourinary: Negative for dysuria and frequency.  Musculoskeletal: Positive for joint pain and myalgias. Negative for falls.  Skin: Negative for rash.  Neurological: Negative for dizziness, loss of consciousness and headaches.  Endo/Heme/Allergies: Negative for environmental allergies.  Psychiatric/Behavioral: Negative for depression. The patient is nervous/anxious.        Objective:     Physical Exam Vitals signs and nursing note reviewed.  Constitutional:      General: He is not in acute distress.    Appearance: He is well-developed.  HENT:     Head: Normocephalic and atraumatic.     Right Ear: Tympanic membrane, ear canal and external ear normal.     Left Ear: Tympanic membrane, ear canal and external ear normal.     Nose: Nose normal.     Mouth/Throat:     Mouth: Mucous membranes are moist.  Eyes:     General:  Right eye: No discharge.        Left eye: No discharge.     Extraocular Movements: Extraocular movements intact.     Conjunctiva/sclera: Conjunctivae normal.     Pupils: Pupils are equal, round, and reactive to light.  Neck:     Musculoskeletal: Normal range of motion and neck supple.  Cardiovascular:     Rate and Rhythm: Normal rate and regular rhythm.     Heart sounds: No murmur.  Pulmonary:     Effort: Pulmonary effort is normal.     Breath sounds: Normal breath sounds.  Abdominal:     General: Bowel sounds are normal. There is no distension.     Palpations: Abdomen is soft. There is no mass.     Tenderness: There is no guarding or rebound.  Skin:    General: Skin is warm and dry.  Neurological:     Mental Status: He is alert and oriented to person, place, and time.     BP 98/64 (BP Location: Left Arm, Patient Position: Sitting, Cuff Size: Normal)   Pulse (!) 105   Temp 98.2 F (36.8 C) (Oral)   Resp 18   Ht 5\' 8"  (1.727 m)   Wt 228 lb (103.4 kg)   SpO2 98%   BMI 34.67 kg/m  Wt Readings from Last 3 Encounters:  09/03/18 228 lb (103.4 kg)  06/21/18 219 lb (99.3 kg)  03/06/18 218 lb 9.6 oz (99.2 kg)     Lab Results  Component Value Date   WBC 8.6 08/31/2018   HGB 15.3 08/31/2018   HCT 43.2 08/31/2018   PLT 357 08/31/2018   GLUCOSE 84 08/31/2018   CHOL 156 08/31/2018   TRIG 168 (H) 08/31/2018   HDL 56 08/31/2018   LDLDIRECT 202.0 03/06/2018   LDLCALC 73 08/31/2018   ALT 30 08/31/2018   AST 25 08/31/2018   NA  140 08/31/2018   K 4.2 08/31/2018   CL 103 08/31/2018   CREATININE 0.88 08/31/2018   BUN 15 08/31/2018   CO2 24 08/31/2018   TSH 1.20 08/31/2018   PSA 0.63 03/06/2018   INR 1.01 02/08/2016    Lab Results  Component Value Date   TSH 1.20 08/31/2018   Lab Results  Component Value Date   WBC 8.6 08/31/2018   HGB 15.3 08/31/2018   HCT 43.2 08/31/2018   MCV 93.7 08/31/2018   PLT 357 08/31/2018   Lab Results  Component Value Date   NA 140 08/31/2018   K 4.2 08/31/2018   CO2 24 08/31/2018   GLUCOSE 84 08/31/2018   BUN 15 08/31/2018   CREATININE 0.88 08/31/2018   BILITOT 0.4 08/31/2018   ALKPHOS 56 03/06/2018   AST 25 08/31/2018   ALT 30 08/31/2018   PROT 7.1 08/31/2018   ALBUMIN 4.7 03/06/2018   CALCIUM 9.7 08/31/2018   ANIONGAP 10 02/08/2016   GFR 84.30 03/06/2018   Lab Results  Component Value Date   CHOL 156 08/31/2018   Lab Results  Component Value Date   HDL 56 08/31/2018   Lab Results  Component Value Date   LDLCALC 73 08/31/2018   Lab Results  Component Value Date   TRIG 168 (H) 08/31/2018   Lab Results  Component Value Date   CHOLHDL 2.8 08/31/2018   No results found for: HGBA1C     Assessment & Plan:   Problem List Items Addressed This Visit    Headache, cluster, episodic    Denies CP/palp/SOB/HA/congestion/fevers/GI or GU c/o. Taking  meds as prescribed no recent flare      Relevant Medications   meloxicam (MOBIC) 15 MG tablet   Testosterone deficiency    Consider switching to topical instead of shots.       Relevant Orders   Testosterone   Obesity    Encouraged DASH diet, decrease po intake and increase exercise as tolerated. Needs 7-8 hours of sleep nightly. Avoid trans fats, eat small, frequent meals every 4-5 hours with lean proteins, complex carbs and healthy fats. Minimize simple carbs, referred to healthy weight and wellness      Hyperlipidemia    Tolerating statin, encouraged heart healthy diet, avoid trans fats, minimize  simple carbs and saturated fats. Increase exercise as tolerated. Takes the statin 5 days a week.does complain of some myalgais so if this does not improve we may to try a holiday and see if his pain improves.      Relevant Orders   Comprehensive metabolic panel   Lipid panel   Preventative health care    Patient encouraged to maintain heart healthy diet, regular exercise, adequate sleep. Consider daily probiotics. Take medications as prescribed. Labs reviewed      Right knee pain - Primary    Using the Meloxicam prn and add Tylenol prn. Despite previous surgery his pain is nearly debilitating. Referred for second ortho appt.       Relevant Orders   Ambulatory referral to Orthopedic Surgery   Dehydration    Increase fluids 60-80      Snoring    Referred to pulmonology for further work up      Relevant Orders   Ambulatory referral to Pulmonology   Tachycardia    Improved on recheck       Relevant Orders   Magnesium   CBC   TSH    Other Visit Diagnoses    Obesity (BMI 30.0-34.9)       Relevant Orders   Amb Ref to Medical Weight Management      I am having Anthony Reese maintain his cetirizine, rosuvastatin, cyclobenzaprine, testosterone cypionate, and meloxicam.  Meds ordered this encounter  Medications  . DISCONTD: meloxicam (MOBIC) 15 MG tablet    Sig: Take 1 tablet (15 mg total) by mouth daily as needed for pain.    Dispense:  30 tablet    Refill:  3  . meloxicam (MOBIC) 15 MG tablet    Sig: Take 1 tablet (15 mg total) by mouth daily as needed for pain.    Dispense:  90 tablet    Refill:  1     Penni Homans, MD

## 2018-09-03 NOTE — Assessment & Plan Note (Signed)
Referred to pulmonology for further work up

## 2018-09-03 NOTE — Assessment & Plan Note (Signed)
Denies CP/palp/SOB/HA/congestion/fevers/GI or GU c/o. Taking meds as prescribed no recent flare

## 2018-09-03 NOTE — Assessment & Plan Note (Signed)
Improved on recheck.  

## 2018-09-03 NOTE — Assessment & Plan Note (Addendum)
Using the Meloxicam prn and add Tylenol prn. Despite previous surgery his pain is nearly debilitating. Referred for second ortho appt.

## 2018-09-03 NOTE — Assessment & Plan Note (Addendum)
Tolerating statin, encouraged heart healthy diet, avoid trans fats, minimize simple carbs and saturated fats. Increase exercise as tolerated. Takes the statin 5 days a week.does complain of some myalgais so if this does not improve we may to try a holiday and see if his pain improves.

## 2018-09-03 NOTE — Assessment & Plan Note (Signed)
Patient encouraged to maintain heart healthy diet, regular exercise, adequate sleep. Consider daily probiotics. Take medications as prescribed. Labs reviewed 

## 2018-09-03 NOTE — Assessment & Plan Note (Signed)
Increase fluids 60-80

## 2018-09-03 NOTE — Assessment & Plan Note (Signed)
Encouraged DASH diet, decrease po intake and increase exercise as tolerated. Needs 7-8 hours of sleep nightly. Avoid trans fats, eat small, frequent meals every 4-5 hours with lean proteins, complex carbs and healthy fats. Minimize simple carbs, referred to healthy weight and wellness

## 2018-09-03 NOTE — Patient Instructions (Addendum)
CoverMyMeds or GoodRx   Axiron or Androgel   Let's try and transition to topical testosterone  Watch the carbohydrates  Hydrate 60-80 ounces of fluids daily  If no improvement in pain can consider decreasing dose of Rosuvastatin Preventive Care 40-64 Years, Male Preventive care refers to lifestyle choices and visits with your health care provider that can promote health and wellness. What does preventive care include?   A yearly physical exam. This is also called an annual well check.  Dental exams once or twice a year.  Routine eye exams. Ask your health care provider how often you should have your eyes checked.  Personal lifestyle choices, including: ? Daily care of your teeth and gums. ? Regular physical activity. ? Eating a healthy diet. ? Avoiding tobacco and drug use. ? Limiting alcohol use. ? Practicing safe sex. ? Taking low-dose aspirin every day starting at age 56. What happens during an annual well check? The services and screenings done by your health care provider during your annual well check will depend on your age, overall health, lifestyle risk factors, and family history of disease. Counseling Your health care provider may ask you questions about your:  Alcohol use.  Tobacco use.  Drug use.  Emotional well-being.  Home and relationship well-being.  Sexual activity.  Eating habits.  Work and work Statistician. Screening You may have the following tests or measurements:  Height, weight, and BMI.  Blood pressure.  Lipid and cholesterol levels. These may be checked every 5 years, or more frequently if you are over 102 years old.  Skin check.  Lung cancer screening. You may have this screening every year starting at age 102 if you have a 30-pack-year history of smoking and currently smoke or have quit within the past 15 years.  Colorectal cancer screening. All adults should have this screening starting at age 60 and continuing until age 26.  Your health care provider may recommend screening at age 36. You will have tests every 1-10 years, depending on your results and the type of screening test. People at increased risk should start screening at an earlier age. Screening tests may include: ? Guaiac-based fecal occult blood testing. ? Fecal immunochemical test (FIT). ? Stool DNA test. ? Virtual colonoscopy. ? Sigmoidoscopy. During this test, a flexible tube with a tiny camera (sigmoidoscope) is used to examine your rectum and lower colon. The sigmoidoscope is inserted through your anus into your rectum and lower colon. ? Colonoscopy. During this test, a long, thin, flexible tube with a tiny camera (colonoscope) is used to examine your entire colon and rectum.  Prostate cancer screening. Recommendations will vary depending on your family history and other risks.  Hepatitis C blood test.  Hepatitis B blood test.  Sexually transmitted disease (STD) testing.  Diabetes screening. This is done by checking your blood sugar (glucose) after you have not eaten for a while (fasting). You may have this done every 1-3 years. Discuss your test results, treatment options, and if necessary, the need for more tests with your health care provider. Vaccines Your health care provider may recommend certain vaccines, such as:  Influenza vaccine. This is recommended every year.  Tetanus, diphtheria, and acellular pertussis (Tdap, Td) vaccine. You may need a Td booster every 10 years.  Varicella vaccine. You may need this if you have not been vaccinated.  Zoster vaccine. You may need this after age 62.  Measles, mumps, and rubella (MMR) vaccine. You may need at least one dose of MMR  if you were born in 1957 or later. You may also need a second dose.  Pneumococcal 13-valent conjugate (PCV13) vaccine. You may need this if you have certain conditions and have not been vaccinated.  Pneumococcal polysaccharide (PPSV23) vaccine. You may need one or  two doses if you smoke cigarettes or if you have certain conditions.  Meningococcal vaccine. You may need this if you have certain conditions.  Hepatitis A vaccine. You may need this if you have certain conditions or if you travel or work in places where you may be exposed to hepatitis A.  Hepatitis B vaccine. You may need this if you have certain conditions or if you travel or work in places where you may be exposed to hepatitis B.  Haemophilus influenzae type b (Hib) vaccine. You may need this if you have certain risk factors. Talk to your health care provider about which screenings and vaccines you need and how often you need them. This information is not intended to replace advice given to you by your health care provider. Make sure you discuss any questions you have with your health care provider. Document Released: 07/17/2015 Document Revised: 08/10/2017 Document Reviewed: 04/21/2015 Elsevier Interactive Patient Education  2019 [employer].

## 2018-09-03 NOTE — Assessment & Plan Note (Signed)
Consider switching to topical instead of shots.

## 2018-10-11 ENCOUNTER — Other Ambulatory Visit: Payer: Self-pay | Admitting: Family Medicine

## 2018-10-11 ENCOUNTER — Encounter: Payer: Self-pay | Admitting: Family Medicine

## 2018-10-11 MED ORDER — MONTELUKAST SODIUM 10 MG PO TABS: 10.0000 mg | ORAL_TABLET | Freq: Every day | ORAL | 3 refills | Status: DC

## 2018-10-11 MED ORDER — DESLORATADINE 5 MG PO TABS: 5.0000 mg | ORAL_TABLET | Freq: Two times a day (BID) | ORAL | 2 refills | Status: DC | PRN

## 2018-10-11 MED ORDER — FLUTICASONE PROPIONATE 50 MCG/ACT NA SUSP: 2.0000 | Freq: Every day | NASAL | 3 refills | Status: DC

## 2018-12-03 ENCOUNTER — Encounter: Payer: Self-pay | Admitting: Family Medicine

## 2018-12-06 ENCOUNTER — Ambulatory Visit (INDEPENDENT_AMBULATORY_CARE_PROVIDER_SITE_OTHER): Payer: BC Managed Care – PPO | Admitting: Medical

## 2018-12-06 ENCOUNTER — Encounter: Payer: Self-pay | Admitting: Medical

## 2018-12-06 ENCOUNTER — Other Ambulatory Visit: Payer: Self-pay | Admitting: Medical

## 2018-12-06 ENCOUNTER — Ambulatory Visit (HOSPITAL_BASED_OUTPATIENT_CLINIC_OR_DEPARTMENT_OTHER)
Admission: RE | Admit: 2018-12-06 | Discharge: 2018-12-06 | Disposition: A | Discharge status: Home / Self Care | Payer: BC Managed Care – PPO | Source: Ambulatory Visit | Attending: Medical | Admitting: Medical

## 2018-12-06 ENCOUNTER — Other Ambulatory Visit: Payer: Self-pay

## 2018-12-06 DIAGNOSIS — M25562 Pain in left knee: Secondary | ICD-10-CM

## 2018-12-06 NOTE — Progress Notes (Signed)
Subjective:    Patient ID: Anthony Reese, male    DOB: 03-Aug-1970, 48 y.o.   MRN: [id number]  HPI  Virtual Visit via Video Note  I connected with Cline Cools on 12/06/18 at 10:00 AM EDT by a video enabled telemedicine application and verified that I am speaking with the correct person using two identifiers.  Location: Patient: home Provider: home  Pt did not check his vitals.   I discussed the limitations of evaluation and management by telemedicine and the availability of in person appointments. The patient expressed understanding and agreed to proceed.  History of Present Illness:  Pt states his left knee hurts.  Has rt knee replacement 3 years ago.   Pt fell/slipped on May 25th and hurt his knee. He states acute severe pain at onset. Lt knee landed on ground. He describes like hurdlers back leg position. Pain hurts at night and swelling. Knee feels weak.   Pt is on meloxciam 15 mg.  Pt states if needs to see ortho will see new one. [facility]. Dr. Maureen Ralphs.  No popliteal pain described.   Observations/Objective: General-no acute distress, pleasant, oriented. Lungs- on inspection lungs appear unlabored. Neck- no tracheal deviation or jvd on inspection. Neuro- gross motor function appears intact. Lt knee- swollen per pt. Full flexion and extension. More pain on flexion.  Assessment and Plan: For recent knee injury will refer you to [facility] as they are going to see you for chronic rt knee pain. Will ask for expedited appointment. Continue with meloxicam. Offered tramadol but pt declined.  If pain increases notify us.  Will update you on xray finding.   Follow up as needed if referral to ortho delayed.  Follow Up Instructions:    I discussed the assessment and treatment plan with the patient. The patient was provided an opportunity to ask questions and all were answered. The patient agreed with the plan and demonstrated an understanding of the instructions.  The patient was advised to call back or seek an in-person evaluation if the symptoms worsen or if the condition fails to improve as anticipated.I    Mackie Pai, PA-C   Review of Systems  Constitutional: Negative for chills, fatigue and fever.  Respiratory: Negative for cough and chest tightness.   Cardiovascular: Negative for chest pain and palpitations.  Musculoskeletal:       See hpi.   Past Medical History:  Diagnosis Date  . Arthritis   . Chicken pox   . H/O tobacco use, presenting hazards to health 07/31/2011  . Headache, cluster, episodic    hx over a yr  . Injury of left shoulder 01/30/2013  . Low testosterone   . Nasal polyp 11/18/2013  . Other and unspecified hyperlipidemia 11/18/2013  . Overweight(278.02)   . Preventative health care 11/18/2013  . Radiculopathy affecting upper extremity 11/20/2011  . Tobacco use disorder 07/31/2011     Social History   Socioeconomic History  . Marital status: Married    Spouse name: Not on file  . Number of children: Not on file  . Years of education: Not on file  . Highest education level: Not on file  Occupational History  . Not on file  Social Needs  . Financial resource strain: Not on file  . Food insecurity:    Worry: Not on file    Inability: Not on file  . Transportation needs:    Medical: Not on file    Non-medical: Not on file  Tobacco Use  .  Smoking status: Former Smoker    Packs/day: 0.25    Years: 3.00    Pack years: 0.75    Last attempt to quit: 01/31/2012    Years since quitting: 6.8  . Smokeless tobacco: Never Used  Substance and Sexual Activity  . Alcohol use: Yes    Alcohol/week: 14.0 standard drinks    Types: 14 Cans of beer per week  . Drug use: No  . Sexual activity: Yes    Partners: Female    Comment: lives with wife, works for [employer], no dietary restrictions.  Lifestyle  . Physical activity:    Days per week: Not on file    Minutes per session: Not on file  .  Stress: Not on file  Relationships  . Social connections:    Talks on phone: Not on file    Gets together: Not on file    Attends religious service: Not on file    Active member of club or organization: Not on file    Attends meetings of clubs or organizations: Not on file    Relationship status: Not on file  . Intimate partner violence:    Fear of current or ex partner: Not on file    Emotionally abused: Not on file    Physically abused: Not on file    Forced sexual activity: Not on file  Other Topics Concern  . Not on file  Social History Narrative  . Not on file    Past Surgical History:  Procedure Laterality Date  . APPENDECTOMY    . BICEPS TENDON REPAIR  2012   right  . CARPAL TUNNEL RELEASE  2013   right  . KNEE SURGERY Right 2013   acl  . NASAL SINUS SURGERY  2016  . 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 TOTAL KNEE ARTHROPLASTY Right 02/19/2016   Procedure: RIGHT TOTAL KNEE ARTHROPLASTY;  Surgeon: Dorna Leitz, MD;  Location: [place];  Service: Orthopedics;  Laterality: Right;    Family History  Problem Relation Age of Onset  . Cancer Mother        leukemia, pancreatic cancer  . Cancer Maternal Aunt        lung  . Cancer Maternal Grandfather        colon   . Cholelithiasis Sister     Allergies  Allergen Reactions  . Imitrex [Sumatriptan]     Swelling around eyes and SOb  . Penicillins     Has patient had a PCN reaction causing immediate rash, facial/tongue/throat swelling, SOB or lightheadedness with hypotension: {unknown Has patient had a PCN reaction causing severe rash involving mucus membranes or skin necrosis: {unknown Has patient had a PCN reaction that required hospitalization {unknwon Has patient had a PCN reaction occurring within the last 10 years: no If all of the above answers are "NO", then may proceed with Cephalosporin use.    Current Outpatient Medications on File Prior to Visit  Medication Sig Dispense Refill  . cyclobenzaprine  (FLEXERIL) 10 MG tablet Take 1 tablet (10 mg total) by mouth 3 (three) times daily as needed for muscle spasms. 30 tablet 0  . desloratadine (CLARINEX) 5 MG tablet Take 1 tablet (5 mg total) by mouth 2 (two) times daily as needed. 60 tablet 2  . fluticasone (FLONASE) 50 MCG/ACT nasal spray Place 2 sprays into both nostrils daily. 16 g 3  . meloxicam (MOBIC) 15 MG tablet Take 1 tablet (15 mg total) by mouth daily as needed for pain.  90 tablet 1  . montelukast (SINGULAIR) 10 MG tablet Take 1 tablet (10 mg total) by mouth at bedtime. 30 tablet 3  . rosuvastatin (CRESTOR) 40 MG tablet Take 1 tablet (40 mg total) by mouth daily. 90 tablet 3  . testosterone cypionate (DEPOTESTOSTERONE CYPIONATE) 200 MG/ML injection Inject 1 mL (200 mg total) into the muscle every 14 (fourteen) days. 10 mL 1   No current facility-administered medications on file prior to visit.     There were no vitals taken for this visit.      Objective:   Physical Exam        Assessment & Plan:

## 2018-12-06 NOTE — Telephone Encounter (Signed)
Spoke with patient and scheduled him with Percell Miller

## 2018-12-06 NOTE — Patient Instructions (Signed)
For recent knee injury will refer you to [facility] as they are going to see you for chronic rt knee pain. Will ask for expedited appointment. Continue with meloxicam. Offered tramadol but pt declined.  If pain increases notify us.  Will update you on xray finding.   Follow up as needed if referral to ortho delayed.

## 2018-12-27 ENCOUNTER — Other Ambulatory Visit: Payer: Self-pay

## 2018-12-27 ENCOUNTER — Encounter: Payer: Self-pay | Admitting: Family Medicine

## 2018-12-27 ENCOUNTER — Other Ambulatory Visit (INDEPENDENT_AMBULATORY_CARE_PROVIDER_SITE_OTHER): Payer: BC Managed Care – PPO

## 2018-12-27 DIAGNOSIS — E349 Endocrine disorder, unspecified: Secondary | ICD-10-CM | POA: Diagnosis not present

## 2018-12-27 DIAGNOSIS — E785 Hyperlipidemia, unspecified: Secondary | ICD-10-CM | POA: Diagnosis not present

## 2018-12-27 DIAGNOSIS — R000 Tachycardia, unspecified: Secondary | ICD-10-CM | POA: Diagnosis not present

## 2018-12-27 LAB — LIPID PANEL
Cholesterol: 207 mg/dL — ABNORMAL HIGH (ref 0–200)
HDL: 56.9 mg/dL (ref >39.00)
NonHDL: 149.72
Total CHOL/HDL Ratio: 4
Triglycerides: 211 mg/dL — ABNORMAL HIGH (ref 0.0–149.0)
VLDL: 42.2 mg/dL — ABNORMAL HIGH (ref 0.0–40.0)

## 2018-12-27 LAB — CBC
HCT: 42.3 % (ref 39.0–52.0)
Hemoglobin: 14.4 g/dL (ref 13.0–17.0)
MCHC: 34.1 g/dL (ref 30.0–36.0)
MCV: 96.6 fl (ref 78.0–100.0)
Platelets: 294 10*3/uL (ref 150.0–400.0)
RBC: 4.38 Mil/uL (ref 4.22–5.81)
RDW: 12.9 % (ref 11.5–15.5)
WBC: 6.5 10*3/uL (ref 4.0–10.5)

## 2018-12-27 LAB — COMPREHENSIVE METABOLIC PANEL
ALT: 30 U/L (ref 0–53)
AST: 24 U/L (ref 0–37)
Albumin: 4.5 g/dL (ref 3.5–5.2)
Alkaline Phosphatase: 58 U/L (ref 39–117)
BUN: 12 mg/dL (ref 6–23)
CO2: 27 mEq/L (ref 19–32)
Calcium: 9.3 mg/dL (ref 8.4–10.5)
Chloride: 101 mEq/L (ref 96–112)
Creatinine, Ser: 0.74 mg/dL (ref 0.40–1.50)
GFR: 113.17 mL/min (ref >60.00)
Glucose, Bld: 81 mg/dL (ref 70–99)
Potassium: 4.4 mEq/L (ref 3.5–5.1)
Sodium: 137 mEq/L (ref 135–145)
Total Bilirubin: 0.6 mg/dL (ref 0.2–1.2)
Total Protein: 7.1 g/dL (ref 6.0–8.3)

## 2018-12-27 LAB — TSH: TSH: 1.74 u[IU]/mL (ref 0.35–4.50)

## 2018-12-27 LAB — LDL CHOLESTEROL, DIRECT: Direct LDL: 131 mg/dL

## 2018-12-27 LAB — TESTOSTERONE: Testosterone: 472.87 ng/dL (ref 300.00–890.00)

## 2018-12-27 LAB — MAGNESIUM: Magnesium: 1.9 mg/dL (ref 1.5–2.5)

## 2019-01-01 ENCOUNTER — Other Ambulatory Visit: Payer: Self-pay

## 2019-01-01 ENCOUNTER — Ambulatory Visit (INDEPENDENT_AMBULATORY_CARE_PROVIDER_SITE_OTHER): Payer: BC Managed Care – PPO | Admitting: Family Medicine

## 2019-01-01 DIAGNOSIS — E349 Endocrine disorder, unspecified: Secondary | ICD-10-CM

## 2019-01-01 DIAGNOSIS — M1711 Unilateral primary osteoarthritis, right knee: Secondary | ICD-10-CM | POA: Diagnosis not present

## 2019-01-01 DIAGNOSIS — G44019 Episodic cluster headache, not intractable: Secondary | ICD-10-CM | POA: Diagnosis not present

## 2019-01-01 DIAGNOSIS — E785 Hyperlipidemia, unspecified: Secondary | ICD-10-CM | POA: Diagnosis not present

## 2019-01-01 NOTE — Assessment & Plan Note (Signed)
Tolerating statin, encouraged heart healthy diet, avoid trans fats, minimize simple carbs and saturated fats. Increase exercise as tolerated. Had run out of his statin for a couple weeks prior to blood draw due to difficulties with mail order, [facility] so numbers are good considering. No changes

## 2019-01-01 NOTE — Assessment & Plan Note (Signed)
No complaints of at the current time

## 2019-01-01 NOTE — Assessment & Plan Note (Addendum)
If he stays active and takes his Meloxicam regularly he does well.notes he strained a muscle in his groin recently with some heavy lifting but it is improving so he will notify us if it worsens

## 2019-01-01 NOTE — Assessment & Plan Note (Signed)
Recent check low normal. No changes at this time

## 2019-01-01 NOTE — Progress Notes (Signed)
Virtual Visit via Video Note  I connected with Anthony Reese on 01/01/19 at  8:40 AM EDT by a video enabled telemedicine application and verified that I am speaking with the correct person using two identifiers.  Location: Patient: home Provider: home   I discussed the limitations of evaluation and management by telemedicine and the availability of in person appointments. The patient expressed understanding and agreed to proceed. Anthony Reese CMA was able to get patient set up on virtual video visit    Subjective:    Patient ID: Anthony Reese, male    DOB: 1971/02/18, 48 y.o.   MRN: [id number]  No chief complaint on file.   HPI Patient is in today for follow up on chronic medical cocerns including hyperlipidemia, hypotestosterone, cluster headaches and more. He feels well today. No recent febrile illness or hospitalizations. No polyuria or polydipsia. He did strain a muscle in his groin while lifting mulch but it has slowly begun to improve. Radiates into upper thigh slightly. Meloxicam helps his knees. Denies CP/palp/SOB/HA/congestion/fevers/GI or GU c/o. Taking meds as prescribed  Past Medical History:  Diagnosis Date  . Arthritis   . Chicken pox   . H/O tobacco use, presenting hazards to health 07/31/2011  . Headache, cluster, episodic    hx over a yr  . Injury of left shoulder 01/30/2013  . Low testosterone   . Nasal polyp 11/18/2013  . Other and unspecified hyperlipidemia 11/18/2013  . Overweight(278.02)   . Preventative health care 11/18/2013  . Radiculopathy affecting upper extremity 11/20/2011  . Tobacco use disorder 07/31/2011    Past Surgical History:  Procedure Laterality Date  . APPENDECTOMY    . BICEPS TENDON REPAIR  2012   right  . CARPAL TUNNEL RELEASE  2013   right  . KNEE SURGERY Right 2013   acl  . NASAL SINUS SURGERY  2016  . 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 TOTAL KNEE ARTHROPLASTY Right 02/19/2016   Procedure: RIGHT TOTAL KNEE ARTHROPLASTY;   Surgeon: Dorna Leitz, MD;  Location: [place];  Service: Orthopedics;  Laterality: Right;    Family History  Problem Relation Age of Onset  . Cancer Mother        leukemia, pancreatic cancer  . Cancer Maternal Aunt        lung  . Cancer Maternal Grandfather        colon   . Cholelithiasis Sister     Social History   Socioeconomic History  . Marital status: Married    Spouse name: Not on file  . Number of children: Not on file  . Years of education: Not on file  . Highest education level: Not on file  Occupational History  . Not on file  Social Needs  . Financial resource strain: Not on file  . Food insecurity    Worry: Not on file    Inability: Not on file  . Transportation needs    Medical: Not on file    Non-medical: Not on file  Tobacco Use  . Smoking status: Former Smoker    Packs/day: 0.25    Years: 3.00    Pack years: 0.75    Quit date: 01/31/2012    Years since quitting: 6.9  . Smokeless tobacco: Never Used  Substance and Sexual Activity  . Alcohol use: Yes    Alcohol/week: 14.0 standard drinks    Types: 14 Cans of beer per week  . Drug use: No  . Sexual activity: Yes    Partners:  Female    Comment: lives with wife, works for [employer], no dietary restrictions.  Lifestyle  . Physical activity    Days per week: Not on file    Minutes per session: Not on file  . Stress: Not on file  Relationships  . Social Herbalist on phone: Not on file    Gets together: Not on file    Attends religious service: Not on file    Active member of club or organization: Not on file    Attends meetings of clubs or organizations: Not on file    Relationship status: Not on file  . Intimate partner violence    Fear of current or ex partner: Not on file    Emotionally abused: Not on file    Physically abused: Not on file    Forced sexual activity: Not on file  Other Topics Concern  . Not on file  Social History Narrative  . Not on file     Outpatient Medications Prior to Visit  Medication Sig Dispense Refill  . desloratadine (CLARINEX) 5 MG tablet Take 1 tablet (5 mg total) by mouth 2 (two) times daily as needed. 60 tablet 2  . fluticasone (FLONASE) 50 MCG/ACT nasal spray Place 2 sprays into both nostrils daily. 16 g 3  . meloxicam (MOBIC) 15 MG tablet Take 1 tablet (15 mg total) by mouth daily as needed for pain. 90 tablet 1  . montelukast (SINGULAIR) 10 MG tablet Take 1 tablet (10 mg total) by mouth at bedtime. 30 tablet 3  . rosuvastatin (CRESTOR) 40 MG tablet Take 1 tablet (40 mg total) by mouth daily. 90 tablet 3  . testosterone cypionate (DEPOTESTOSTERONE CYPIONATE) 200 MG/ML injection Inject 1 mL (200 mg total) into the muscle every 14 (fourteen) days. 10 mL 1  . cyclobenzaprine (FLEXERIL) 10 MG tablet Take 1 tablet (10 mg total) by mouth 3 (three) times daily as needed for muscle spasms. 30 tablet 0   No facility-administered medications prior to visit.     Allergies  Allergen Reactions  . Imitrex [Sumatriptan]     Swelling around eyes and SOb  . Penicillins     Has patient had a PCN reaction causing immediate rash, facial/tongue/throat swelling, SOB or lightheadedness with hypotension: {unknown Has patient had a PCN reaction causing severe rash involving mucus membranes or skin necrosis: {unknown Has patient had a PCN reaction that required hospitalization {unknwon Has patient had a PCN reaction occurring within the last 10 years: no If all of the above answers are "NO", then may proceed with Cephalosporin use.    Review of Systems  Constitutional: Negative for fever and malaise/fatigue.  HENT: Negative for congestion.   Eyes: Negative for blurred vision.  Respiratory: Negative for shortness of breath.   Cardiovascular: Negative for chest pain, palpitations and leg swelling.  Gastrointestinal: Negative for abdominal pain, blood in stool and nausea.  Genitourinary: Negative for dysuria and frequency.   Musculoskeletal: Positive for joint pain. Negative for falls.  Skin: Negative for rash.  Neurological: Negative for dizziness, loss of consciousness and headaches.  Endo/Heme/Allergies: Negative for environmental allergies.  Psychiatric/Behavioral: Negative for depression. The patient is not nervous/anxious.        Objective:    Physical Exam Constitutional:      Appearance: Normal appearance. He is not ill-appearing.  HENT:     Head: Normocephalic and atraumatic.     Nose: Nose normal.  Eyes:     General:  Right eye: No discharge.        Left eye: No discharge.  Pulmonary:     Effort: Pulmonary effort is normal.  Neurological:     Mental Status: He is alert and oriented to person, place, and time.  Psychiatric:        Mood and Affect: Mood normal.        Behavior: Behavior normal.     There were no vitals taken for this visit. Wt Readings from Last 3 Encounters:  09/03/18 228 lb (103.4 kg)  06/21/18 219 lb (99.3 kg)  03/06/18 218 lb 9.6 oz (99.2 kg)    Diabetic Foot Exam - Simple   No data filed     Lab Results  Component Value Date   WBC 6.5 12/27/2018   HGB 14.4 12/27/2018   HCT 42.3 12/27/2018   PLT 294.0 12/27/2018   GLUCOSE 81 12/27/2018   CHOL 207 (H) 12/27/2018   TRIG 211.0 (H) 12/27/2018   HDL 56.90 12/27/2018   LDLDIRECT 131.0 12/27/2018   LDLCALC 73 08/31/2018   ALT 30 12/27/2018   AST 24 12/27/2018   NA 137 12/27/2018   K 4.4 12/27/2018   CL 101 12/27/2018   CREATININE 0.74 12/27/2018   BUN 12 12/27/2018   CO2 27 12/27/2018   TSH 1.74 12/27/2018   PSA 0.63 03/06/2018   INR 1.01 02/08/2016    Lab Results  Component Value Date   TSH 1.74 12/27/2018   Lab Results  Component Value Date   WBC 6.5 12/27/2018   HGB 14.4 12/27/2018   HCT 42.3 12/27/2018   MCV 96.6 12/27/2018   PLT 294.0 12/27/2018   Lab Results  Component Value Date   NA 137 12/27/2018   K 4.4 12/27/2018   CO2 27 12/27/2018   GLUCOSE 81 12/27/2018   BUN 12  12/27/2018   CREATININE 0.74 12/27/2018   BILITOT 0.6 12/27/2018   ALKPHOS 58 12/27/2018   AST 24 12/27/2018   ALT 30 12/27/2018   PROT 7.1 12/27/2018   ALBUMIN 4.5 12/27/2018   CALCIUM 9.3 12/27/2018   ANIONGAP 10 02/08/2016   GFR 113.17 12/27/2018   Lab Results  Component Value Date   CHOL 207 (H) 12/27/2018   Lab Results  Component Value Date   HDL 56.90 12/27/2018   Lab Results  Component Value Date   LDLCALC 73 08/31/2018   Lab Results  Component Value Date   TRIG 211.0 (H) 12/27/2018   Lab Results  Component Value Date   CHOLHDL 4 12/27/2018   No results found for: HGBA1C     Assessment & Plan:   Problem List Items Addressed This Visit    Primary osteoarthritis of right knee (Chronic)    If he stays active and takes his Meloxicam regularly he does well.notes he strained a muscle in his groin recently with some heavy lifting but it is improving so he will notify us if it worsens      Headache, cluster, episodic    No complaints of at the current time      Testosterone deficiency    Recent check low normal. No changes at this time      Hyperlipidemia    Tolerating statin, encouraged heart healthy diet, avoid trans fats, minimize simple carbs and saturated fats. Increase exercise as tolerated. Had run out of his statin for a couple weeks prior to blood draw due to difficulties with mail order, [facility] so numbers are good considering. No changes  I have discontinued Jagar Lua cyclobenzaprine. I am also having him maintain his rosuvastatin, testosterone cypionate, meloxicam, desloratadine, fluticasone, and montelukast.  No orders of the defined types were placed in this encounter.    I discussed the assessment and treatment plan with the patient. The patient was provided an opportunity to ask questions and all were answered. The patient agreed with the plan and demonstrated an understanding of the instructions.   The patient was  advised to call back or seek an in-person evaluation if the symptoms worsen or if the condition fails to improve as anticipated.  I provided 25 minutes of non-face-to-face time during this encounter.   Penni Homans, MD

## 2019-01-07 ENCOUNTER — Encounter: Payer: Self-pay | Admitting: Family Medicine

## 2019-01-07 ENCOUNTER — Other Ambulatory Visit: Payer: Self-pay | Admitting: Family Medicine

## 2019-01-07 ENCOUNTER — Ambulatory Visit (HOSPITAL_BASED_OUTPATIENT_CLINIC_OR_DEPARTMENT_OTHER)
Admission: RE | Admit: 2019-01-07 | Discharge: 2019-01-07 | Disposition: A | Discharge status: Home / Self Care | Payer: BC Managed Care – PPO | Source: Ambulatory Visit | Attending: Family Medicine | Admitting: Family Medicine

## 2019-01-07 ENCOUNTER — Other Ambulatory Visit: Payer: Self-pay

## 2019-01-07 DIAGNOSIS — M79651 Pain in right thigh: Secondary | ICD-10-CM | POA: Diagnosis not present

## 2019-01-08 ENCOUNTER — Encounter: Payer: Self-pay | Admitting: Family Medicine

## 2019-01-08 ENCOUNTER — Other Ambulatory Visit: Payer: Self-pay | Admitting: Family Medicine

## 2019-01-08 MED ORDER — TIZANIDINE HCL 4 MG PO TABS: 2.0000 mg | ORAL_TABLET | Freq: Three times a day (TID) | ORAL | 1 refills | Status: DC | PRN

## 2019-01-08 MED ORDER — METHYLPREDNISOLONE 4 MG PO TABS: ORAL_TABLET | ORAL | 0 refills | Status: DC

## 2019-01-08 MED FILL — tiZANidine HCL 4 MG TABS: 4 | 13 days supply | Qty: 40 | Fill #0

## 2019-01-08 MED FILL — METHYLPREDNISOLONE 4 MG TAB: 4 | 5 days supply | Qty: 15 | Fill #0

## 2019-03-22 ENCOUNTER — Encounter: Payer: Self-pay | Admitting: Family Medicine

## 2019-03-22 DIAGNOSIS — E349 Endocrine disorder, unspecified: Secondary | ICD-10-CM

## 2019-03-25 ENCOUNTER — Other Ambulatory Visit: Payer: Self-pay | Admitting: Family Medicine

## 2019-03-25 DIAGNOSIS — E349 Endocrine disorder, unspecified: Secondary | ICD-10-CM

## 2019-03-25 MED ORDER — TESTOSTERONE CYPIONATE 200 MG/ML IM SOLN: 200.0000 mg | INTRAMUSCULAR | 1 refills | Status: DC

## 2019-03-25 NOTE — Telephone Encounter (Signed)
Testosterone refill.   Last OV: 01/01/2019 Last Fill: 07/18/2018 #39mL and 1RF

## 2019-05-27 ENCOUNTER — Other Ambulatory Visit: Payer: Self-pay | Admitting: Family Medicine

## 2019-11-12 ENCOUNTER — Other Ambulatory Visit: Payer: Self-pay | Admitting: Family Medicine

## 2019-11-12 DIAGNOSIS — E785 Hyperlipidemia, unspecified: Secondary | ICD-10-CM

## 2019-11-14 ENCOUNTER — Encounter: Payer: Self-pay | Admitting: Family Medicine

## 2019-11-18 ENCOUNTER — Other Ambulatory Visit: Payer: Self-pay | Admitting: Family Medicine

## 2019-11-18 ENCOUNTER — Telehealth: Payer: Self-pay | Admitting: *Deleted

## 2019-11-18 DIAGNOSIS — E349 Endocrine disorder, unspecified: Secondary | ICD-10-CM

## 2019-11-18 MED ORDER — TESTOSTERONE CYPIONATE 200 MG/ML IM SOLN: 200.0000 mg | INTRAMUSCULAR | 0 refills | Status: DC

## 2019-11-18 NOTE — Telephone Encounter (Signed)
I have sent in one last refill but he will need an appointment for further refills

## 2019-11-18 NOTE — Telephone Encounter (Signed)
[facility] faxed over a request for testosterone.  Last fill 03/25/19 Last ov 01/01/19  Message has been sent to patient to schedule follow up appointment soon

## 2019-11-19 NOTE — Telephone Encounter (Signed)
Mychart sent to patient to call and schedule appointment.  It looks like we were in the process of getting scheduled.

## 2019-12-06 ENCOUNTER — Telehealth: Payer: Self-pay | Admitting: *Deleted

## 2019-12-06 NOTE — Telephone Encounter (Signed)
[facility] requests refill for testosterone  Last written: 11/18/19 Last ov: 01/01/20 Next ov: 12/10/19 Contract: none UDS: none

## 2019-12-07 ENCOUNTER — Encounter: Payer: Self-pay | Admitting: Family Medicine

## 2019-12-07 DIAGNOSIS — E349 Endocrine disorder, unspecified: Secondary | ICD-10-CM

## 2019-12-07 DIAGNOSIS — Z0000 Encounter for general adult medical examination without abnormal findings: Secondary | ICD-10-CM

## 2019-12-08 ENCOUNTER — Other Ambulatory Visit: Payer: Self-pay | Admitting: Family Medicine

## 2019-12-08 DIAGNOSIS — E349 Endocrine disorder, unspecified: Secondary | ICD-10-CM

## 2019-12-08 MED ORDER — TESTOSTERONE CYPIONATE 200 MG/ML IM SOLN: 200.0000 mg | INTRAMUSCULAR | 0 refills | Status: DC

## 2019-12-08 NOTE — Telephone Encounter (Signed)
I refilled but if he misses appt no further refills til labs are done. I generally need a testosterone level every 6 months.

## 2019-12-09 NOTE — Telephone Encounter (Signed)
Left message on machine that patient can come in office for appt tomorrow or he call back to make and appointment with Dr. Nani Ravens.

## 2019-12-10 ENCOUNTER — Telehealth: Payer: BC Managed Care – PPO | Admitting: Family Medicine

## 2019-12-30 ENCOUNTER — Telehealth: Payer: BC Managed Care – PPO | Admitting: Family Medicine

## 2019-12-31 ENCOUNTER — Other Ambulatory Visit (INDEPENDENT_AMBULATORY_CARE_PROVIDER_SITE_OTHER): Payer: BC Managed Care – PPO

## 2019-12-31 DIAGNOSIS — Z125 Encounter for screening for malignant neoplasm of prostate: Secondary | ICD-10-CM | POA: Diagnosis not present

## 2019-12-31 DIAGNOSIS — E349 Endocrine disorder, unspecified: Secondary | ICD-10-CM

## 2019-12-31 DIAGNOSIS — Z0000 Encounter for general adult medical examination without abnormal findings: Secondary | ICD-10-CM | POA: Diagnosis not present

## 2019-12-31 LAB — LIPID PANEL
Cholesterol: 208 mg/dL — ABNORMAL HIGH (ref 0–200)
HDL: 53.2 mg/dL (ref >39.00)
NonHDL: 155.07
Total CHOL/HDL Ratio: 4
Triglycerides: 387 mg/dL — ABNORMAL HIGH (ref 0.0–149.0)
VLDL: 77.4 mg/dL — ABNORMAL HIGH (ref 0.0–40.0)

## 2019-12-31 LAB — COMPREHENSIVE METABOLIC PANEL
ALT: 27 U/L (ref 0–53)
AST: 19 U/L (ref 0–37)
Albumin: 4.5 g/dL (ref 3.5–5.2)
Alkaline Phosphatase: 64 U/L (ref 39–117)
BUN: 24 mg/dL — ABNORMAL HIGH (ref 6–23)
CO2: 27 mEq/L (ref 19–32)
Calcium: 9.5 mg/dL (ref 8.4–10.5)
Chloride: 103 mEq/L (ref 96–112)
Creatinine, Ser: 0.99 mg/dL (ref 0.40–1.50)
GFR: 80.54 mL/min (ref >60.00)
Glucose, Bld: 98 mg/dL (ref 70–99)
Potassium: 4.1 mEq/L (ref 3.5–5.1)
Sodium: 138 mEq/L (ref 135–145)
Total Bilirubin: 0.5 mg/dL (ref 0.2–1.2)
Total Protein: 7.5 g/dL (ref 6.0–8.3)

## 2019-12-31 LAB — CBC WITH DIFFERENTIAL/PLATELET
Basophils Absolute: 0.1 10*3/uL (ref 0.0–0.1)
Basophils Relative: 0.9 % (ref 0.0–3.0)
Eosinophils Absolute: 0.5 10*3/uL (ref 0.0–0.7)
Eosinophils Relative: 5.9 % — ABNORMAL HIGH (ref 0.0–5.0)
HCT: 44.8 % (ref 39.0–52.0)
Hemoglobin: 15.6 g/dL (ref 13.0–17.0)
Lymphocytes Relative: 21.4 % (ref 12.0–46.0)
Lymphs Abs: 1.7 10*3/uL (ref 0.7–4.0)
MCHC: 34.9 g/dL (ref 30.0–36.0)
MCV: 94.8 fl (ref 78.0–100.0)
Monocytes Absolute: 0.6 10*3/uL (ref 0.1–1.0)
Monocytes Relative: 8.1 % (ref 3.0–12.0)
Neutro Abs: 4.9 10*3/uL (ref 1.4–7.7)
Neutrophils Relative %: 63.7 % (ref 43.0–77.0)
Platelets: 299 10*3/uL (ref 150.0–400.0)
RBC: 4.72 Mil/uL (ref 4.22–5.81)
RDW: 12.5 % (ref 11.5–15.5)
WBC: 7.8 10*3/uL (ref 4.0–10.5)

## 2019-12-31 LAB — LDL CHOLESTEROL, DIRECT: Direct LDL: 122 mg/dL

## 2019-12-31 LAB — PSA: PSA: 0.74 ng/mL (ref 0.10–4.00)

## 2019-12-31 LAB — TSH: TSH: 1.28 u[IU]/mL (ref 0.35–4.50)

## 2020-01-01 ENCOUNTER — Encounter: Payer: Self-pay | Admitting: Family Medicine

## 2020-01-01 ENCOUNTER — Other Ambulatory Visit: Payer: Self-pay | Admitting: Family Medicine

## 2020-01-01 ENCOUNTER — Other Ambulatory Visit: Payer: BC Managed Care – PPO

## 2020-01-01 ENCOUNTER — Other Ambulatory Visit: Payer: Self-pay

## 2020-01-01 ENCOUNTER — Ambulatory Visit (INDEPENDENT_AMBULATORY_CARE_PROVIDER_SITE_OTHER): Payer: BC Managed Care – PPO | Admitting: Family Medicine

## 2020-01-01 VITALS — BP 108/76 | HR 82 | Temp 96.4°F | Ht 68.0 in | Wt 224.0 lb

## 2020-01-01 DIAGNOSIS — Z0000 Encounter for general adult medical examination without abnormal findings: Secondary | ICD-10-CM

## 2020-01-01 DIAGNOSIS — H9193 Unspecified hearing loss, bilateral: Secondary | ICD-10-CM

## 2020-01-01 DIAGNOSIS — E785 Hyperlipidemia, unspecified: Secondary | ICD-10-CM

## 2020-01-01 LAB — TESTOSTERONE TOTAL,FREE,BIO, MALES
Albumin: 4.5 g/dL (ref 3.6–5.1)
Sex Hormone Binding: 17 nmol/L (ref 10–50)
Testosterone: 193 ng/dL — ABNORMAL LOW (ref 250–827)

## 2020-01-01 MED ORDER — FENOFIBRATE 54 MG PO TABS: 54.0000 mg | ORAL_TABLET | Freq: Every day | ORAL | 3 refills | Status: DC

## 2020-01-01 NOTE — Patient Instructions (Signed)
Keep the diet clean and stay active. ° °Aim to do some physical exertion for 150 minutes per week. This is typically divided into 5 days per week, 30 minutes per day. The activity should be enough to get your heart rate up. Anything is better than nothing if you have time constraints. ° °Let us know if you need anything. ° °Healthy Eating Plan °Many factors influence your heart health, including eating and exercise habits. Heart (coronary) risk increases with abnormal blood fat (lipid) levels. Heart-healthy meal planning includes limiting unhealthy fats, increasing healthy fats, and making other small dietary changes. This includes maintaining a healthy body weight to help keep lipid levels within a normal range. ° °WHAT IS MY PLAN?  °Your health care provider recommends that you: °Drink a glass of water before meals to help with satiety. °Eat slowly. °An alternative to the water is to add Metamucil. This will help with satiety as well. It does contain calories, unlike water. ° °WHAT TYPES OF FAT SHOULD I CHOOSE? °Choose healthy fats more often. Choose monounsaturated and polyunsaturated fats, such as olive oil and canola oil, flaxseeds, walnuts, almonds, and seeds. °Eat more omega-3 fats. Good choices include salmon, mackerel, sardines, tuna, flaxseed oil, and ground flaxseeds. Aim to eat fish at least two times each week. °Avoid foods with partially hydrogenated oils in them. These contain trans fats. Examples of foods that contain trans fats are stick margarine, some tub margarines, cookies, crackers, and other baked goods. If you are going to avoid a fat, this is the one to avoid! ° °WHAT GENERAL GUIDELINES DO I NEED TO FOLLOW? °Check food labels carefully to identify foods with trans fats. Avoid these types of options when possible. °Fill one half of your plate with vegetables and green salads. Eat 4-5 servings of vegetables per day. A serving of vegetables equals 1 cup of raw leafy vegetables, ½ cup of raw or  cooked cut-up vegetables, or ½ cup of vegetable juice. °Fill one fourth of your plate with whole grains. Look for the word "whole" as the first word in the ingredient list. °Fill one fourth of your plate with lean protein foods. °Eat 4-5 servings of fruit per day. A serving of fruit equals one medium whole fruit, ¼ cup of dried fruit, ½ cup of fresh, frozen, or canned fruit. Try to avoid fruits in cups/syrups as the sugar content can be high. °Eat more foods that contain soluble fiber. Examples of foods that contain this type of fiber are apples, broccoli, carrots, beans, peas, and barley. Aim to get 20-30 g of fiber per day. °Eat more home-cooked food and less restaurant, buffet, and fast food. °Limit or avoid alcohol. °Limit foods that are high in starch and sugar. °Avoid fried foods when able. °Cook foods by using methods other than frying. Baking, boiling, grilling, and broiling are all great options. Other fat-reducing suggestions include: °Removing the skin from poultry. °Removing all visible fats from meats. °Skimming the fat off of stews, soups, and gravies before serving them. °Steaming vegetables in water or broth. °Lose weight if you are overweight. Losing just 5-10% of your initial body weight can help your overall health and prevent diseases such as diabetes and heart disease. °Increase your consumption of nuts, legumes, and seeds to 4-5 servings per week. One serving of dried beans or legumes equals ½ cup after being cooked, one serving of nuts equals 1½ ounces, and one serving of seeds equals ½ ounce or 1 tablespoon. ° °WHAT ARE GOOD FOODS   CAN I EAT? °Grains °Grainy breads (try to find bread that is 3 g of fiber per slice or greater), oatmeal, light popcorn. Whole-grain cereals. Rice and pasta, including brown rice and those that are made with whole wheat. Edamame pasta is a great alternative to grain pasta. It has a higher protein content. Try to avoid significant consumption of white bread, sugary  cereals, or pastries/baked goods. ° °Vegetables °All vegetables. Cooked white potatoes do not count as vegetables. ° °Fruits °All fruits, but limit pineapple and bananas as these fruits have a higher sugar content. ° °Meats and Other Protein Sources °Lean, well-trimmed beef, veal, pork, and lamb. Chicken and turkey without skin. All fish and shellfish. Wild duck, rabbit, pheasant, and venison. Egg whites or low-cholesterol egg substitutes. Dried beans, peas, lentils, and tofu. Seeds and most nuts. ° °Dairy °Low-fat or nonfat cheeses, including ricotta, string, and mozzarella. Skim or 1% milk that is liquid, powdered, or evaporated. Buttermilk that is made with low-fat milk. Nonfat or low-fat yogurt. Soy/Almond milk are good alternatives if you cannot handle dairy. ° °Beverages °Water is the best for you. Sports drinks with less sugar are more desirable unless you are a highly active athlete. ° °Sweets and Desserts °Sherbets and fruit ices. Honey, jam, marmalade, jelly, and syrups. Dark chocolate.  °Eat all sweets and desserts in moderation. ° °Fats and Oils °Nonhydrogenated (trans-free) margarines. Vegetable oils, including soybean, sesame, sunflower, olive, peanut, safflower, corn, canola, and cottonseed. Salad dressings or mayonnaise that are made with a vegetable oil. Limit added fats and oils that you use for cooking, baking, salads, and as spreads. ° °Other °Cocoa powder. Coffee and tea. Most condiments. ° °The items listed above may not be a complete list of recommended foods or beverages. Contact your dietitian for more options. °

## 2020-01-01 NOTE — Progress Notes (Signed)
Chief Complaint  Patient presents with  . Annual Exam    Well Male Anthony Reese is here for a complete physical.   His last physical was >1 year ago.  Current diet: in general, diet could be better.   Current exercise: no consistent exercise Weight trend: stable Fatigue? No. Seat belt? Yes.    Health maintenance Tetanus- Yes HIV- No  Past Medical History:  Diagnosis Date  . Arthritis   . Chicken pox   . H/O tobacco use, presenting hazards to health 07/31/2011  . Headache, cluster, episodic    hx over a yr  . Injury of left shoulder 01/30/2013  . Low testosterone   . Nasal polyp 11/18/2013  . Other and unspecified hyperlipidemia 11/18/2013  . Overweight(278.02)   . Preventative health care 11/18/2013  . Radiculopathy affecting upper extremity 11/20/2011  . Tobacco use disorder 07/31/2011     Past Surgical History:  Procedure Laterality Date  . APPENDECTOMY    . BICEPS TENDON REPAIR  2012   right  . CARPAL TUNNEL RELEASE  2013   right  . KNEE SURGERY Right 2013   acl  . NASAL SINUS SURGERY  2016  . 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 TOTAL KNEE ARTHROPLASTY Right 02/19/2016   Procedure: RIGHT TOTAL KNEE ARTHROPLASTY;  Surgeon: Dorna Leitz, MD;  Location: [facility];  Service: Orthopedics;  Laterality: Right;    Medications  Current Outpatient Medications on File Prior to Visit  Medication Sig Dispense Refill  . fluticasone (FLONASE) 50 MCG/ACT nasal spray Place 2 sprays into both nostrils daily. 16 g 3  . meloxicam (MOBIC) 15 MG tablet TAKE 1 TABLET DAILY AS NEEDED FOR PAIN 90 tablet 3  . montelukast (SINGULAIR) 10 MG tablet Take 1 tablet (10 mg total) by mouth at bedtime. 30 tablet 3  . rosuvastatin (CRESTOR) 40 MG tablet TAKE 1 TABLET DAILY 90 tablet 0  . testosterone cypionate (DEPOTESTOSTERONE CYPIONATE) 200 MG/ML injection Inject 1 mL (200 mg total) into the muscle every 14 (fourteen) days. 10 mL 0   Allergies Allergies  Allergen Reactions  . Imitrex  [Sumatriptan]     Swelling around eyes and SOb  . Penicillins     Has patient had a PCN reaction causing immediate rash, facial/tongue/throat swelling, SOB or lightheadedness with hypotension: {unknown Has patient had a PCN reaction causing severe rash involving mucus membranes or skin necrosis: {unknown Has patient had a PCN reaction that required hospitalization {unknwon Has patient had a PCN reaction occurring within the last 10 years: no If all of the above answers are "NO", then may proceed with Cephalosporin use.    Family History Family History  Problem Relation Age of Onset  . Cancer Mother        leukemia, pancreatic cancer  . Cancer Maternal Aunt        lung  . Cancer Maternal Grandfather        colon   . Cholelithiasis Sister     Review of Systems: Constitutional: no fevers or chills Eye:  no recent significant change in vision Ear/Nose/Mouth/Throat:  Ears: + hearing loss Nose/Mouth/Throat:  no complaints of nasal congestion, no sore throat Cardiovascular:  no chest pain Respiratory:  no shortness of breath Gastrointestinal:  no abdominal pain, no change in bowel habits GU:  Male: negative for dysuria, frequency, and incontinence Musculoskeletal/Extremities:  +b/l knee pain; otherwise no pain of the joints Integumentary (Skin/Breast):  no abnormal skin lesions reported Neurologic:  no headaches Endocrine: No unexpected weight  changes Hematologic/Lymphatic:  no night sweats  Exam BP 108/76 (BP Location: Left Arm, Patient Position: Sitting, Cuff Size: Normal)   Pulse 82   Temp (!) 96.4 F (35.8 C) (Temporal)   Ht 5\' 8"  (1.727 m)   Wt 224 lb (101.6 kg)   SpO2 98%   BMI 34.06 kg/m  General:  well developed, well nourished, in no apparent distress Skin:  no significant moles, warts, or growths Head:  no masses, lesions, or tenderness Eyes:  pupils equal and round, sclera anicteric without injection Ears:  canals without lesions, TMs shiny without retraction,  no obvious effusion, no erythema Nose:  nares patent, septum midline, mucosa normal Throat/Pharynx:  lips and gingiva without lesion; tongue and uvula midline; non-inflamed pharynx; no exudates or postnasal drainage Neck: neck supple without adenopathy, thyromegaly, or masses Lungs:  clear to auscultation, breath sounds equal bilaterally, no respiratory distress Cardio:  regular rate and rhythm, no bruits, no LE edema Abdomen:  abdomen soft, nontender; bowel sounds normal; no masses or organomegaly Rectal: Deferred Musculoskeletal:  symmetrical muscle groups noted without atrophy or deformity Extremities:  no clubbing, cyanosis, or edema, no deformities, no skin discoloration Neuro:  gait normal; deep tendon reflexes normal and symmetric Psych: well oriented with normal range of affect and appropriate judgment/insight  Assessment and Plan  Well adult exam  Bilateral hearing loss, unspecified hearing loss type - Plan: Ambulatory referral to Audiology   Well 49 y.o. male. Counseled on diet and exercise. Had labs yesterday. Will try to add on HIV and Hep C screening to yesterday's sample. Not high risk enough to poke today. Will reck lipids in 6 weeks after he is on his statin more consistently. Could add on then. For hearing loss, which appears to be worsening, will refer to audiology team.  F/u w reg PCP as originally scheduled.  The patient voiced understanding and agreement to the plan.  Coffman C[place], DO 01/01/20 3:49 PM

## 2020-01-02 ENCOUNTER — Encounter: Payer: Self-pay | Admitting: Family Medicine

## 2020-01-02 LAB — HEPATITIS C ANTIBODY
Hepatitis C Ab: NONREACTIVE
SIGNAL TO CUT-OFF: 0.08 (ref <1.00)

## 2020-01-02 LAB — HIV ANTIBODY (ROUTINE TESTING W REFLEX): HIV 1&2 Ab, 4th Generation: NONREACTIVE

## 2020-01-02 NOTE — Addendum Note (Signed)
Addended byDamita Dunnings D on: 01/02/2020 10:18 AM   Modules accepted: Orders

## 2020-01-07 ENCOUNTER — Other Ambulatory Visit: Payer: Self-pay | Admitting: *Deleted

## 2020-01-07 DIAGNOSIS — E785 Hyperlipidemia, unspecified: Secondary | ICD-10-CM

## 2020-01-07 NOTE — Telephone Encounter (Signed)
Patient scheduled for 3 months

## 2020-02-07 ENCOUNTER — Encounter: Payer: Self-pay | Admitting: Family Medicine

## 2020-02-13 ENCOUNTER — Other Ambulatory Visit: Payer: Self-pay

## 2020-02-13 ENCOUNTER — Other Ambulatory Visit (INDEPENDENT_AMBULATORY_CARE_PROVIDER_SITE_OTHER): Payer: BC Managed Care – PPO

## 2020-02-13 DIAGNOSIS — E349 Endocrine disorder, unspecified: Secondary | ICD-10-CM

## 2020-02-13 LAB — CBC WITH DIFFERENTIAL/PLATELET
Basophils Absolute: 0.1 10*3/uL (ref 0.0–0.1)
Basophils Relative: 1.3 % (ref 0.0–3.0)
Eosinophils Absolute: 0.4 10*3/uL (ref 0.0–0.7)
Eosinophils Relative: 6.6 % — ABNORMAL HIGH (ref 0.0–5.0)
HCT: 42.5 % (ref 39.0–52.0)
Hemoglobin: 14.6 g/dL (ref 13.0–17.0)
Lymphocytes Relative: 24.3 % (ref 12.0–46.0)
Lymphs Abs: 1.4 10*3/uL (ref 0.7–4.0)
MCHC: 34.2 g/dL (ref 30.0–36.0)
MCV: 96.3 fl (ref 78.0–100.0)
Monocytes Absolute: 0.5 10*3/uL (ref 0.1–1.0)
Monocytes Relative: 8.7 % (ref 3.0–12.0)
Neutro Abs: 3.5 10*3/uL (ref 1.4–7.7)
Neutrophils Relative %: 59.1 % (ref 43.0–77.0)
Platelets: 302 10*3/uL (ref 150.0–400.0)
RBC: 4.42 Mil/uL (ref 4.22–5.81)
RDW: 12.8 % (ref 11.5–15.5)
WBC: 5.9 10*3/uL (ref 4.0–10.5)

## 2020-02-16 ENCOUNTER — Encounter: Payer: Self-pay | Admitting: Family Medicine

## 2020-02-25 ENCOUNTER — Encounter: Payer: Self-pay | Admitting: Family Medicine

## 2020-03-31 ENCOUNTER — Encounter: Payer: Self-pay | Admitting: Family Medicine

## 2020-04-02 ENCOUNTER — Other Ambulatory Visit: Payer: BC Managed Care – PPO

## 2020-04-02 ENCOUNTER — Other Ambulatory Visit: Payer: Self-pay

## 2020-04-02 DIAGNOSIS — E785 Hyperlipidemia, unspecified: Secondary | ICD-10-CM

## 2020-04-02 NOTE — Addendum Note (Signed)
Addended by: Kelle Darting A on: 04/02/2020 08:48 AM   Modules accepted: Orders

## 2020-04-04 ENCOUNTER — Telehealth: Payer: Self-pay

## 2020-04-04 NOTE — Telephone Encounter (Signed)
Called and checked back with patient regarding what company he uses for refill for his oxygen, several times, but patient was unsure.  Called [facility],but no call back.  Reached out to [facility] and left message for Verbal Order (Oxygen) and waiting for call back on Monday.

## 2020-04-06 ENCOUNTER — Telehealth: Payer: Self-pay

## 2020-04-06 DIAGNOSIS — G44019 Episodic cluster headache, not intractable: Secondary | ICD-10-CM

## 2020-04-06 NOTE — Telephone Encounter (Signed)
See phone message

## 2020-04-06 NOTE — Telephone Encounter (Signed)
error 

## 2020-04-06 NOTE — Telephone Encounter (Signed)
DME-Oxygen order was updated and waiting for provider for signature, in the yellow folder.  Once signed will fax to [facility] (DME-Oxygen).  Updated message has been sent to pcp.

## 2020-04-06 NOTE — Telephone Encounter (Signed)
LMOM to call back

## 2020-04-06 NOTE — Progress Notes (Signed)
Patient notified of labs and Oxygen -DME has been ordered and waiting for pcp signature and fax to [facility]  Reason for order (Cluster Headaches).  Patient states has oxygen one oxygen tank left, and cluster headaches, is not as frequent.

## 2020-04-06 NOTE — Telephone Encounter (Signed)
Last Order for home use only DME oxygen was 06/15/2018 with [facility] ([place]) for oxygen has expired. [facility] is requesting a new order for Oxygen.   (8-10 Liters for 15 minutes via non-re breather at onset of cluster headaches).  A Verbal Order is not acceptable.  Once new Script is ordered, then will fax to [facility] [phone]).

## 2020-04-07 LAB — COMPREHENSIVE METABOLIC PANEL
AG Ratio: 1.8 (calc) (ref 1.0–2.5)
ALT: 29 U/L (ref 9–46)
AST: 26 U/L (ref 10–40)
Albumin: 4.5 g/dL (ref 3.6–5.1)
Alkaline phosphatase (APISO): 52 U/L (ref 36–130)
BUN: 18 mg/dL (ref 7–25)
CO2: 26 mmol/L (ref 20–32)
Calcium: 9.2 mg/dL (ref 8.6–10.3)
Chloride: 103 mmol/L (ref 98–110)
Creat: 1.25 mg/dL (ref 0.60–1.35)
Globulin: 2.5 g/dL (calc) (ref 1.9–3.7)
Glucose, Bld: 89 mg/dL (ref 65–99)
Potassium: 4 mmol/L (ref 3.5–5.3)
Sodium: 139 mmol/L (ref 135–146)
Total Bilirubin: 0.6 mg/dL (ref 0.2–1.2)
Total Protein: 7 g/dL (ref 6.1–8.1)

## 2020-04-07 LAB — TEST AUTHORIZATION

## 2020-04-07 LAB — LIPID PANEL
Cholesterol: 172 mg/dL (ref <200)
HDL: 40 mg/dL (ref >=40)
LDL Cholesterol (Calc): 99 mg/dL (calc)
Non-HDL Cholesterol (Calc): 132 mg/dL (calc) — ABNORMAL HIGH (ref <130)
Total CHOL/HDL Ratio: 4.3 (calc) (ref <5.0)
Triglycerides: 211 mg/dL — ABNORMAL HIGH (ref <150)

## 2020-04-07 LAB — TESTOSTERONE: Testosterone: 307 ng/dL (ref 250–827)

## 2020-04-07 NOTE — Telephone Encounter (Signed)
New Oxygen Orders has been signed and faxed to [facility] with confirmation.  Patient has been informed.

## 2020-04-29 ENCOUNTER — Encounter: Payer: Self-pay | Admitting: Family Medicine

## 2020-04-29 DIAGNOSIS — E349 Endocrine disorder, unspecified: Secondary | ICD-10-CM

## 2020-04-29 MED ORDER — TESTOSTERONE CYPIONATE 200 MG/ML IM SOLN: 200.0000 mg | INTRAMUSCULAR | 0 refills | Status: DC

## 2020-04-29 NOTE — Telephone Encounter (Signed)
Requesting: testosterone cypionate 200mg /mL injection Contract: N/A UDS: N/A Last Visit: 01/01/2020 Next Visit: None scheduled Last Refill: 12/08/2019 #18mL and 0RF Pt sig: 20mL every 14 days  Last labs 04/02/2020  Please Advise

## 2020-06-10 ENCOUNTER — Other Ambulatory Visit: Payer: Self-pay | Admitting: Family Medicine

## 2020-06-10 MED FILL — MELOXICAM 15 MG TABLET: 15 | 30 days supply | Qty: 30 | Fill #0

## 2020-07-10 ENCOUNTER — Other Ambulatory Visit: Payer: Self-pay | Admitting: Family Medicine

## 2020-07-10 ENCOUNTER — Encounter: Payer: Self-pay | Admitting: Family Medicine

## 2020-07-10 MED ORDER — BENZONATATE 100 MG PO CAPS: 200.0000 mg | ORAL_CAPSULE | Freq: Three times a day (TID) | ORAL | 1 refills | Status: DC | PRN

## 2020-07-10 NOTE — Telephone Encounter (Signed)
Pt states he tested positive on [id number], no symptoms yesterday but today he has a cough and some body aches cough more than aches. He takes meloxicam so he asks if he should stay away from Ibuprofen.  Pt should take one or the other correct? Since they are both Nsaids.

## 2020-07-11 ENCOUNTER — Encounter: Payer: Self-pay | Admitting: Family Medicine

## 2020-07-11 DIAGNOSIS — Z1211 Encounter for screening for malignant neoplasm of colon: Secondary | ICD-10-CM

## 2020-07-13 NOTE — Telephone Encounter (Signed)
Pt aware of medication sent in. 

## 2020-08-05 MED FILL — MELOXICAM 15 MG TABLET: 15 | 30 days supply | Qty: 30 | Fill #1

## 2020-09-01 ENCOUNTER — Other Ambulatory Visit: Payer: Self-pay | Admitting: Physician Assistant

## 2020-09-01 ENCOUNTER — Other Ambulatory Visit (HOSPITAL_COMMUNITY): Payer: Self-pay | Admitting: Physician Assistant

## 2020-09-01 DIAGNOSIS — Z96651 Presence of right artificial knee joint: Secondary | ICD-10-CM

## 2020-09-22 ENCOUNTER — Other Ambulatory Visit: Payer: Self-pay | Admitting: Family Medicine

## 2020-09-22 DIAGNOSIS — E349 Endocrine disorder, unspecified: Secondary | ICD-10-CM

## 2020-09-22 NOTE — Telephone Encounter (Signed)
I have refilled but he is at 6 months please order him the same labs from last time he got his testosterone checked and add a UDS and make sure he has an appt before the end of the year.

## 2020-09-22 NOTE — Telephone Encounter (Signed)
Requesting: testosterone Contract: none UDS: none Last Visit: 01/01/2020 Next Visit: none Last Refill: 04/29/20  Please Advise

## 2020-09-23 ENCOUNTER — Encounter (HOSPITAL_COMMUNITY)
Admission: RE | Admit: 2020-09-23 | Discharge: 2020-09-23 | Disposition: A | Discharge status: Home / Self Care | Payer: BC Managed Care – PPO | Source: Ambulatory Visit | Attending: Physician Assistant | Admitting: Physician Assistant

## 2020-09-23 ENCOUNTER — Other Ambulatory Visit: Payer: Self-pay

## 2020-09-23 DIAGNOSIS — Z96651 Presence of right artificial knee joint: Secondary | ICD-10-CM | POA: Insufficient documentation

## 2020-09-23 MED ORDER — TECHNETIUM TC 99M MEDRONATE IV KIT
20.0000 | PACK | Freq: Once | INTRAVENOUS | Status: AC | PRN
  Administered 2020-09-23: 21 via INTRAVENOUS

## 2020-11-18 ENCOUNTER — Other Ambulatory Visit: Payer: Self-pay | Admitting: Family Medicine

## 2020-11-18 DIAGNOSIS — E785 Hyperlipidemia, unspecified: Secondary | ICD-10-CM

## 2020-11-19 ENCOUNTER — Other Ambulatory Visit: Payer: Self-pay | Admitting: *Deleted

## 2020-11-19 DIAGNOSIS — E349 Endocrine disorder, unspecified: Secondary | ICD-10-CM

## 2020-11-19 MED ORDER — TESTOSTERONE CYPIONATE 200 MG/ML IM SOLN
200.0000 mg | INTRAMUSCULAR | 1 refills | Status: DC
Start: 2020-11-19 — End: 2020-12-06

## 2020-11-19 NOTE — Telephone Encounter (Signed)
He has not been seen in over a year. This is his last prescription. He needs an appt

## 2020-11-19 NOTE — Telephone Encounter (Signed)
Requesting: testosterone Contract: none UDS: none Last Visit: 01/01/20 Next Visit: none Last Refill: 09/22/20  Please Advise

## 2020-11-20 NOTE — Telephone Encounter (Signed)
Left message on machine to call back to schedule follow up appointment. ?

## 2020-11-23 ENCOUNTER — Encounter: Payer: Self-pay | Admitting: Family Medicine

## 2020-11-23 ENCOUNTER — Ambulatory Visit (INDEPENDENT_AMBULATORY_CARE_PROVIDER_SITE_OTHER): Payer: BC Managed Care – PPO | Admitting: Family Medicine

## 2020-11-23 ENCOUNTER — Other Ambulatory Visit: Payer: Self-pay

## 2020-11-23 VITALS — BP 116/78 | HR 81 | Temp 98.1°F | Resp 16 | Wt 223.6 lb

## 2020-11-23 DIAGNOSIS — M7710 Lateral epicondylitis, unspecified elbow: Secondary | ICD-10-CM | POA: Insufficient documentation

## 2020-11-23 DIAGNOSIS — M7711 Lateral epicondylitis, right elbow: Secondary | ICD-10-CM

## 2020-11-23 DIAGNOSIS — R000 Tachycardia, unspecified: Secondary | ICD-10-CM

## 2020-11-23 DIAGNOSIS — M25561 Pain in right knee: Secondary | ICD-10-CM | POA: Diagnosis not present

## 2020-11-23 DIAGNOSIS — E349 Endocrine disorder, unspecified: Secondary | ICD-10-CM | POA: Diagnosis not present

## 2020-11-23 DIAGNOSIS — Z1211 Encounter for screening for malignant neoplasm of colon: Secondary | ICD-10-CM

## 2020-11-23 DIAGNOSIS — T7840XS Allergy, unspecified, sequela: Secondary | ICD-10-CM

## 2020-11-23 DIAGNOSIS — M542 Cervicalgia: Secondary | ICD-10-CM | POA: Insufficient documentation

## 2020-11-23 DIAGNOSIS — E669 Obesity, unspecified: Secondary | ICD-10-CM

## 2020-11-23 DIAGNOSIS — E785 Hyperlipidemia, unspecified: Secondary | ICD-10-CM | POA: Diagnosis not present

## 2020-11-23 DIAGNOSIS — M25562 Pain in left knee: Secondary | ICD-10-CM | POA: Insufficient documentation

## 2020-11-23 LAB — COMPREHENSIVE METABOLIC PANEL
ALT: 22 U/L (ref 0–53)
AST: 19 U/L (ref 0–37)
Albumin: 4.1 g/dL (ref 3.5–5.2)
Alkaline Phosphatase: 55 U/L (ref 39–117)
BUN: 9 mg/dL (ref 6–23)
CO2: 30 mEq/L (ref 19–32)
Calcium: 9 mg/dL (ref 8.4–10.5)
Chloride: 103 mEq/L (ref 96–112)
Creatinine, Ser: 0.84 mg/dL (ref 0.40–1.50)
GFR: 102.53 mL/min (ref >60.00)
Glucose, Bld: 99 mg/dL (ref 70–99)
Potassium: 5.2 mEq/L — ABNORMAL HIGH (ref 3.5–5.1)
Sodium: 139 mEq/L (ref 135–145)
Total Bilirubin: 0.5 mg/dL (ref 0.2–1.2)
Total Protein: 6.9 g/dL (ref 6.0–8.3)

## 2020-11-23 LAB — CBC
HCT: 50.1 % (ref 39.0–52.0)
Hemoglobin: 17.1 g/dL — ABNORMAL HIGH (ref 13.0–17.0)
MCHC: 34.1 g/dL (ref 30.0–36.0)
MCV: 95 fl (ref 78.0–100.0)
Platelets: 297 10*3/uL (ref 150.0–400.0)
RBC: 5.27 Mil/uL (ref 4.22–5.81)
RDW: 13.5 % (ref 11.5–15.5)
WBC: 7.4 10*3/uL (ref 4.0–10.5)

## 2020-11-23 LAB — TSH: TSH: 1.05 u[IU]/mL (ref 0.35–4.50)

## 2020-11-23 LAB — LIPID PANEL
Cholesterol: 214 mg/dL — ABNORMAL HIGH (ref 0–200)
HDL: 48.8 mg/dL (ref >39.00)
LDL Cholesterol: 135 mg/dL — ABNORMAL HIGH (ref 0–99)
NonHDL: 165.65
Total CHOL/HDL Ratio: 4
Triglycerides: 153 mg/dL — ABNORMAL HIGH (ref 0.0–149.0)
VLDL: 30.6 mg/dL (ref 0.0–40.0)

## 2020-11-23 LAB — TESTOSTERONE: Testosterone: 711.19 ng/dL (ref 300.00–890.00)

## 2020-11-23 MED ORDER — ROSUVASTATIN CALCIUM 40 MG PO TABS: 40.0000 mg | ORAL_TABLET | Freq: Every day | ORAL | 1 refills | Status: DC

## 2020-11-23 NOTE — Assessment & Plan Note (Signed)
Intermittent and waiting to get the right knee fixed before they deal with it

## 2020-11-23 NOTE — Patient Instructions (Addendum)
MIND diet    High Cholesterol  High cholesterol is a condition in which the blood has high levels of a white, waxy substance similar to fat (cholesterol). The liver makes all the cholesterol that the body needs. The human body needs small amounts of cholesterol to help build cells. A person gets extra or excess cholesterol from the food that he or she eats. The blood carries cholesterol from the liver to the rest of the body. If you have high cholesterol, deposits (plaques) may build up on the walls of your arteries. Arteries are the blood vessels that carry blood away from your heart. These plaques make the arteries narrow and stiff. Cholesterol plaques increase your risk for heart attack and stroke. Work with your health care provider to keep your cholesterol levels in a healthy range. What increases the risk? The following factors may make you more likely to develop this condition:  Eating foods that are high in animal fat (saturated fat) or cholesterol.  Being overweight.  Not getting enough exercise.  A family history of high cholesterol (familial hypercholesterolemia).  Use of tobacco products.  Having diabetes. What are the signs or symptoms? There are no symptoms of this condition. How is this diagnosed? This condition may be diagnosed based on the results of a blood test.  If you are older than 49 years of age, your health care provider may check your cholesterol levels every 4-6 years.  You may be checked more often if you have high cholesterol or other risk factors for heart disease. The blood test for cholesterol measures:  "Bad" cholesterol, or LDL cholesterol. This is the main type of cholesterol that causes heart disease. The desired level is less than 100 mg/dL.  "Good" cholesterol, or HDL cholesterol. HDL helps protect against heart disease by cleaning the arteries and carrying the LDL to the liver for processing. The desired level for HDL is 60 mg/dL or  higher.  Triglycerides. These are fats that your body can store or burn for energy. The desired level is less than 150 mg/dL.  Total cholesterol. This measures the total amount of cholesterol in your blood and includes LDL, HDL, and triglycerides. The desired level is less than 200 mg/dL. How is this treated? This condition may be treated with:  Diet changes. You may be asked to eat foods that have more fiber and less saturated fats or added sugar.  Lifestyle changes. These may include regular exercise, maintaining a healthy weight, and quitting use of tobacco products.  Medicines. These are given when diet and lifestyle changes have not worked. You may be prescribed a statin medicine to help lower your cholesterol levels. Follow these instructions at home: Eating and drinking  Eat a healthy, balanced diet. This diet includes: ? Daily servings of a variety of fresh, frozen, or canned fruits and vegetables. ? Daily servings of whole grain foods that are rich in fiber. ? Foods that are low in saturated fats and trans fats. These include poultry and fish without skin, lean cuts of meat, and low-fat dairy products. ? A variety of fish, especially oily fish that contain omega-3 fatty acids. Aim to eat fish at least 2 times a week.  Avoid foods and drinks that have added sugar.  Use healthy cooking methods, such as roasting, grilling, broiling, baking, poaching, steaming, and stir-frying. Do not fry your food except for stir-frying.   Lifestyle  Get regular exercise. Aim to exercise for a total of 150 minutes a week. Increase your  activity level by doing activities such as gardening, walking, and taking the stairs.  Do not use any products that contain nicotine or tobacco, such as cigarettes, e-cigarettes, and chewing tobacco. If you need help quitting, ask your health care provider.   General instructions  Take over-the-counter and prescription medicines only as told by your health care  provider.  Keep all follow-up visits as told by your health care provider. This is important. Where to find more information  American Heart Association: www.heart.org  National Heart, Lung, and Blood Institute: https://wilson-eaton.com/ Contact a health care provider if:  You have trouble achieving or maintaining a healthy diet or weight.  You are starting an exercise program.  You are unable to stop smoking. Get help right away if:  You have chest pain.  You have trouble breathing.  You have any symptoms of a stroke. "BE FAST" is an easy way to remember the main warning signs of a stroke: ? B - Balance. Signs are dizziness, sudden trouble walking, or loss of balance. ? E - Eyes. Signs are trouble seeing or a sudden change in vision. ? F - Face. Signs are sudden weakness or numbness of the face, or the face or eyelid drooping on one side. ? A - Arms. Signs are weakness or numbness in an arm. This happens suddenly and usually on one side of the body. ? S - Speech. Signs are sudden trouble speaking, slurred speech, or trouble understanding what people say. ? T - Time. Time to call emergency services. Write down what time symptoms started.  You have other signs of a stroke, such as: ? A sudden, severe headache with no known cause. ? Nausea or vomiting. ? Seizure. These symptoms may represent a serious problem that is an emergency. Do not wait to see if the symptoms will go away. Get medical help right away. Call your local emergency services (911 in the [place] Do not drive yourself to the hospital. Summary  Cholesterol plaques increase your risk for heart attack and stroke. Work with your health care provider to keep your cholesterol levels in a healthy range.  Eat a healthy, balanced diet, get regular exercise, and maintain a healthy weight.  Do not use any products that contain nicotine or tobacco, such as cigarettes, e-cigarettes, and chewing tobacco.  Get help right away if you  have any symptoms of a stroke. This information is not intended to replace advice given to you by your health care provider. Make sure you discuss any questions you have with your health care provider. Document Revised: 05/20/2019 Document Reviewed: 05/20/2019 Elsevier Patient Education  2021 [employer].

## 2020-11-23 NOTE — Assessment & Plan Note (Signed)
RRR today 

## 2020-11-23 NOTE — Progress Notes (Signed)
Subjective:    Patient ID: Anthony Reese, male    DOB: Sep 05, 1970, 50 y.o.   MRN: [id number]  Chief Complaint  Patient presents with  . Medication Refill    Pt has no concerns or problems.    HPI Patient is in today for follow up on chronic medical concerns including high cholesterol. He has been out of his crestor for about 3 weeks and has not been taking his fenofibrate. He denies any recent febrile illness or hospitalization. He is having chronic trouble with knees R>L and his right elbow intermittently. No fall or trauma recently. Also had a bad flare of neck pain with pain radiating to left arm but with some chiropractic adjustments he is improving. .Denies CP/palp/SOB/HA/congestion/fevers/GI or GU c/o. Taking meds as prescribed  Past Medical History:  Diagnosis Date  . Arthritis   . Chicken pox   . H/O tobacco use, presenting hazards to health 07/31/2011  . Headache, cluster, episodic    hx over a yr  . Injury of left shoulder 01/30/2013  . Low testosterone   . Nasal polyp 11/18/2013  . Other and unspecified hyperlipidemia 11/18/2013  . Overweight(278.02)   . Preventative health care 11/18/2013  . Radiculopathy affecting upper extremity 11/20/2011  . Tobacco use disorder 07/31/2011    Past Surgical History:  Procedure Laterality Date  . APPENDECTOMY    . BICEPS TENDON REPAIR  2012   right  . CARPAL TUNNEL RELEASE  2013   right  . KNEE SURGERY Right 2013   acl  . NASAL SINUS SURGERY  2016  . 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 TOTAL KNEE ARTHROPLASTY Right 02/19/2016   Procedure: RIGHT TOTAL KNEE ARTHROPLASTY;  Surgeon: Dorna Leitz, MD;  Location: [facility];  Service: Orthopedics;  Laterality: Right;    Family History  Problem Relation Age of Onset  . Cancer Mother        leukemia, pancreatic cancer  . Cancer Maternal Aunt        lung  . Cancer Maternal Grandfather        colon   . Cholelithiasis Sister     Social History   Socioeconomic History  .  Marital status: Married    Spouse name: Not on file  . Number of children: Not on file  . Years of education: Not on file  . Highest education level: Not on file  Occupational History  . Not on file  Tobacco Use  . Smoking status: Former Smoker    Packs/day: 0.25    Years: 3.00    Pack years: 0.75    Quit date: 01/31/2012    Years since quitting: 8.8  . Smokeless tobacco: Never Used  Substance and Sexual Activity  . Alcohol use: Yes    Alcohol/week: 14.0 standard drinks    Types: 14 Cans of beer per week  . Drug use: No  . Sexual activity: Yes    Partners: Female    Comment: lives with wife, works for [employer], no dietary restrictions.  Other Topics Concern  . Not on file  Social History Narrative  . Not on file   Social Determinants of Health   Financial Resource Strain: Not on file  Food Insecurity: Not on file  Transportation Needs: Not on file  Physical Activity: Not on file  Stress: Not on file  Social Connections: Not on file  Intimate Partner Violence: Not on file    Outpatient Medications Prior to Visit  Medication Sig Dispense Refill  .  fluticasone (FLONASE) 50 MCG/ACT nasal spray Place 2 sprays into both nostrils daily. 16 g 3  . meloxicam (MOBIC) 15 MG tablet Take 1 tablet (15 mg total) by mouth daily as needed for pain. 90 tablet 0  . testosterone cypionate (DEPOTESTOSTERONE CYPIONATE) 200 MG/ML injection Inject 1 mL (200 mg total) into the muscle every 14 (fourteen) days. 2 mL 1  . rosuvastatin (CRESTOR) 40 MG tablet Take 1 tablet (40 mg total) by mouth daily. 90 tablet 0  . benzonatate (TESSALON PERLES) 100 MG capsule Take 2 capsules (200 mg total) by mouth 3 (three) times daily as needed for cough. 40 capsule 1  . fenofibrate 54 MG tablet Take 1 tablet (54 mg total) by mouth daily. 30 tablet 3  . montelukast (SINGULAIR) 10 MG tablet Take 1 tablet (10 mg total) by mouth at bedtime. 30 tablet 3   No facility-administered  medications prior to visit.    Allergies  Allergen Reactions  . Imitrex [Sumatriptan]     Swelling around eyes and SOb  . Penicillins     Has patient had a PCN reaction causing immediate rash, facial/tongue/throat swelling, SOB or lightheadedness with hypotension: {unknown Has patient had a PCN reaction causing severe rash involving mucus membranes or skin necrosis: {unknown Has patient had a PCN reaction that required hospitalization {unknwon Has patient had a PCN reaction occurring within the last 10 years: no If all of the above answers are "NO", then may proceed with Cephalosporin use.    Review of Systems  Constitutional: Positive for malaise/fatigue. Negative for fever.  HENT: Negative for congestion.   Eyes: Negative for blurred vision.  Respiratory: Negative for shortness of breath.   Cardiovascular: Negative for chest pain, palpitations and leg swelling.  Gastrointestinal: Negative for abdominal pain, blood in stool and nausea.  Genitourinary: Negative for dysuria and frequency.  Musculoskeletal: Positive for joint pain, myalgias and neck pain. Negative for falls.  Skin: Negative for rash.  Neurological: Negative for dizziness, loss of consciousness and headaches.  Endo/Heme/Allergies: Negative for environmental allergies.  Psychiatric/Behavioral: Negative for depression. The patient is not nervous/anxious.        Objective:    Physical Exam Vitals and nursing note reviewed.  Constitutional:      General: He is not in acute distress.    Appearance: He is well-developed.  HENT:     Head: Normocephalic and atraumatic.     Nose: Nose normal.  Eyes:     General:        Right eye: No discharge.        Left eye: No discharge.  Cardiovascular:     Rate and Rhythm: Normal rate and regular rhythm.     Heart sounds: No murmur heard.   Pulmonary:     Effort: Pulmonary effort is normal.     Breath sounds: Normal breath sounds.  Abdominal:     General: Bowel sounds  are normal.     Palpations: Abdomen is soft.     Tenderness: There is no abdominal tenderness.  Musculoskeletal:     Cervical back: Normal range of motion and neck supple.  Skin:    General: Skin is warm and dry.  Neurological:     Mental Status: He is alert and oriented to person, place, and time.     BP 116/78   Pulse 81   Temp 98.1 F (36.7 C)   Resp 16   Wt 223 lb 9.6 oz (101.4 kg)   SpO2 98%   BMI  34.00 kg/m  Wt Readings from Last 3 Encounters:  11/23/20 223 lb 9.6 oz (101.4 kg)  01/01/20 224 lb (101.6 kg)  09/03/18 228 lb (103.4 kg)    Diabetic Foot Exam - Simple   No data filed    Lab Results  Component Value Date   WBC 5.9 02/13/2020   HGB 14.6 02/13/2020   HCT 42.5 02/13/2020   PLT 302.0 02/13/2020   GLUCOSE 89 04/02/2020   CHOL 172 04/02/2020   TRIG 211 (H) 04/02/2020   HDL 40 04/02/2020   LDLDIRECT 122.0 12/31/2019   LDLCALC 99 04/02/2020   ALT 29 04/02/2020   AST 26 04/02/2020   NA 139 04/02/2020   K 4.0 04/02/2020   CL 103 04/02/2020   CREATININE 1.25 04/02/2020   BUN 18 04/02/2020   CO2 26 04/02/2020   TSH 1.28 12/31/2019   PSA 0.74 12/31/2019   INR 1.01 02/08/2016    Lab Results  Component Value Date   TSH 1.28 12/31/2019   Lab Results  Component Value Date   WBC 5.9 02/13/2020   HGB 14.6 02/13/2020   HCT 42.5 02/13/2020   MCV 96.3 02/13/2020   PLT 302.0 02/13/2020   Lab Results  Component Value Date   NA 139 04/02/2020   K 4.0 04/02/2020   CO2 26 04/02/2020   GLUCOSE 89 04/02/2020   BUN 18 04/02/2020   CREATININE 1.25 04/02/2020   BILITOT 0.6 04/02/2020   ALKPHOS 64 12/31/2019   AST 26 04/02/2020   ALT 29 04/02/2020   PROT 7.0 04/02/2020   ALBUMIN 4.5 12/31/2019   CALCIUM 9.2 04/02/2020   ANIONGAP 10 02/08/2016   GFR 80.54 12/31/2019   Lab Results  Component Value Date   CHOL 172 04/02/2020   Lab Results  Component Value Date   HDL 40 04/02/2020   Lab Results  Component Value Date   LDLCALC 99 04/02/2020    Lab Results  Component Value Date   TRIG 211 (H) 04/02/2020   Lab Results  Component Value Date   CHOLHDL 4.3 04/02/2020   No results found for: HGBA1C     Assessment & Plan:   Problem List Items Addressed This Visit    Testosterone deficiency    Supplement and monitor      Relevant Orders   Testosterone   Obesity    Encouraged MIND diet, decrease po intake and increase exercise as tolerated. Needs 7-8 hours of sleep nightly. Avoid trans fats, eat small, frequent meals every 4-5 hours with lean proteins, complex carbs and healthy fats. Minimize simple carbs      Hyperlipidemia    Tolerating statin, encouraged heart healthy diet, avoid trans fats, minimize simple carbs and saturated fats. Increase exercise as tolerated. Out of statin for about 3 weeks      Relevant Medications   rosuvastatin (CRESTOR) 40 MG tablet   Other Relevant Orders   Comprehensive metabolic panel   Lipid panel   Allergy    Bad this time. Of year with eyes matted shut      Relevant Orders   CBC   Colon cancer screening - Primary    No symptoms but referred for first screening colonoscopy. His MGF diagnosed with colon cancer at roughly 23. He is still living now at 44      Relevant Orders   Ambulatory referral to Gastroenterology   Right knee pain    Got a second opinion about his recurrent right knee pain and swelling. He saw Dr Maureen Ralphs  who had him wear a brace for a month and the swelling went down so after a work up they are considering a revision of his TKR.       Relevant Orders   CBC   Tachycardia    RRR today      Relevant Orders   TSH   Lateral epicondylitis    Keeps reinjuring it. Encouraged to ice and apply lidocaine geltid and apply a splint if no improvement then call for referral to sports medicine       Neck pain    With upper extremity on left at times. He has had some adjustments and is improving. Can consider adding a muscle relaxant.       Left knee pain     Intermittent and waiting to get the right knee fixed before they deal with it         I have discontinued Naveed Smelcer's montelukast, fenofibrate, and benzonatate. I am also having him maintain his fluticasone, meloxicam, testosterone cypionate, and rosuvastatin.  Meds ordered this encounter  Medications  . rosuvastatin (CRESTOR) 40 MG tablet    Sig: Take 1 tablet (40 mg total) by mouth daily.    Dispense:  90 tablet    Refill:  1    Requested drug refills are authorized, however, the patient needs further evaluation and/or laboratory testing before further refills are given. Ask him to make an appointment for this.     Penni Homans, MD

## 2020-11-23 NOTE — Assessment & Plan Note (Signed)
Bad this time. Of year with eyes matted shut

## 2020-11-23 NOTE — Assessment & Plan Note (Signed)
Keeps reinjuring it. Encouraged to ice and apply lidocaine geltid and apply a splint if no improvement then call for referral to sports medicine

## 2020-11-23 NOTE — Assessment & Plan Note (Signed)
Supplement and monitor 

## 2020-11-23 NOTE — Assessment & Plan Note (Signed)
With upper extremity on left at times. He has had some adjustments and is improving. Can consider adding a muscle relaxant.

## 2020-11-23 NOTE — Assessment & Plan Note (Signed)
Encouraged MIND diet, decrease po intake and increase exercise as tolerated. Needs 7-8 hours of sleep nightly. Avoid trans fats, eat small, frequent meals every 4-5 hours with lean proteins, complex carbs and healthy fats. Minimize simple carbs

## 2020-11-23 NOTE — Assessment & Plan Note (Addendum)
Tolerating statin, encouraged heart healthy diet, avoid trans fats, minimize simple carbs and saturated fats. Increase exercise as tolerated. Out of statin for about 3 weeks

## 2020-11-23 NOTE — Assessment & Plan Note (Signed)
No symptoms but referred for first screening colonoscopy. His MGF diagnosed with colon cancer at roughly 71. He is still living now at 44

## 2020-11-23 NOTE — Assessment & Plan Note (Signed)
Got a second opinion about his recurrent right knee pain and swelling. He saw Dr Maureen Ralphs who had him wear a brace for a month and the swelling went down so after a work up they are considering a revision of his TKR.

## 2020-11-24 ENCOUNTER — Other Ambulatory Visit: Payer: Self-pay | Admitting: *Deleted

## 2020-11-24 DIAGNOSIS — E875 Hyperkalemia: Secondary | ICD-10-CM

## 2020-12-04 ENCOUNTER — Telehealth: Payer: Self-pay | Admitting: *Deleted

## 2020-12-04 NOTE — Telephone Encounter (Signed)
[facility] requesting refill for 90 day testosterone  Last written: 11/19/20 at local pharmacy Last ov: 11/23/20 Next ov: n/a Contract: none UDS: none

## 2020-12-05 IMAGING — DX LEFT KNEE - COMPLETE 4+ VIEW
4 series · 4 of 4 positions shown · non-contrast
Comparison: None.

CLINICAL DATA: Left knee pain after injury last week.

EXAM:
LEFT KNEE - COMPLETE 4+ VIEW

[knee ap]
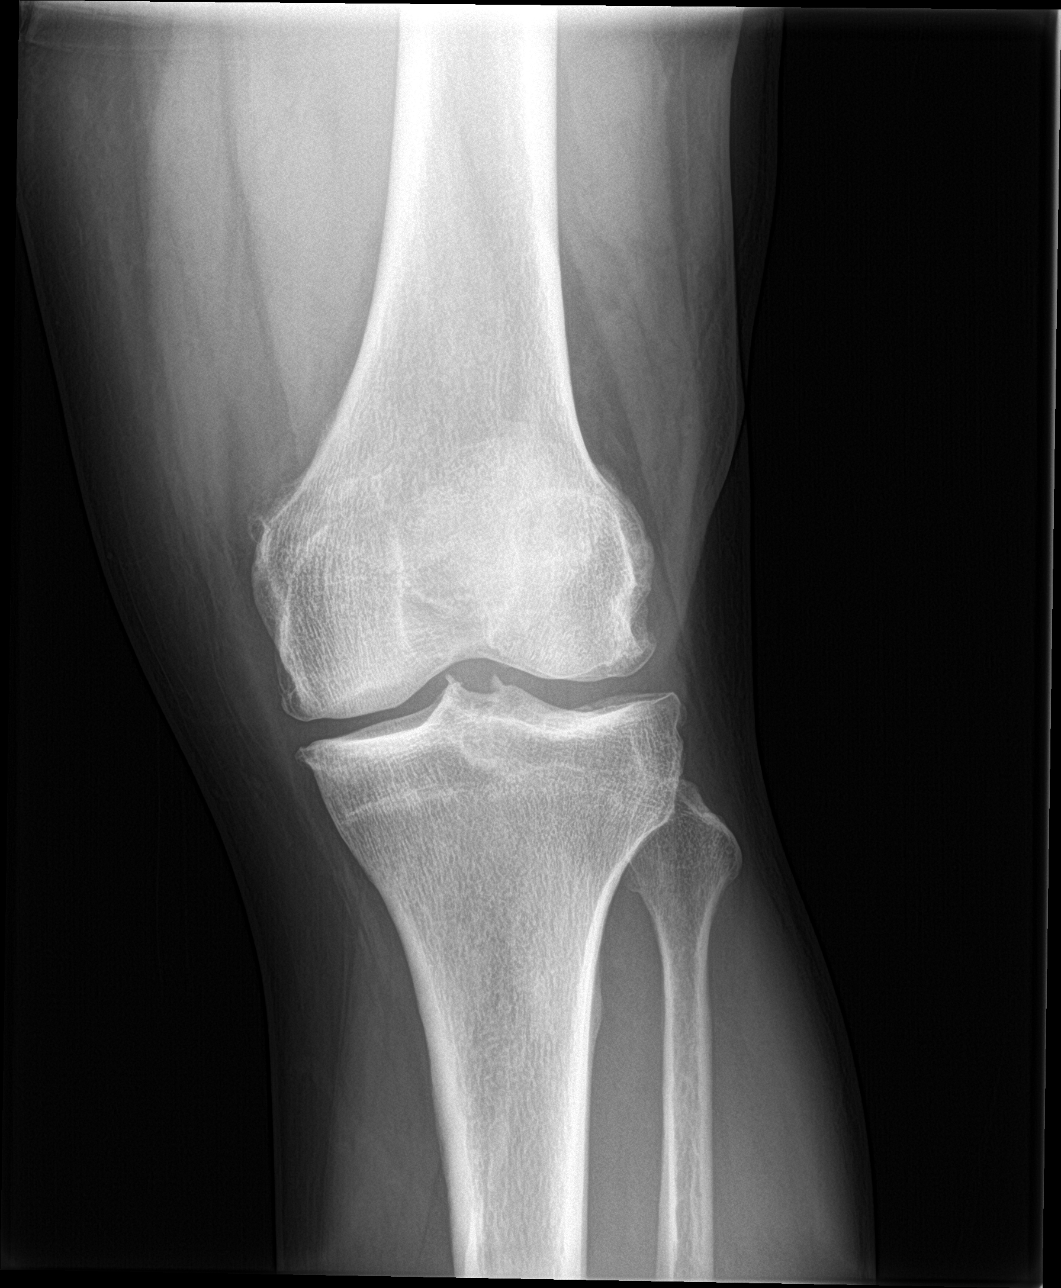

[knee lat]
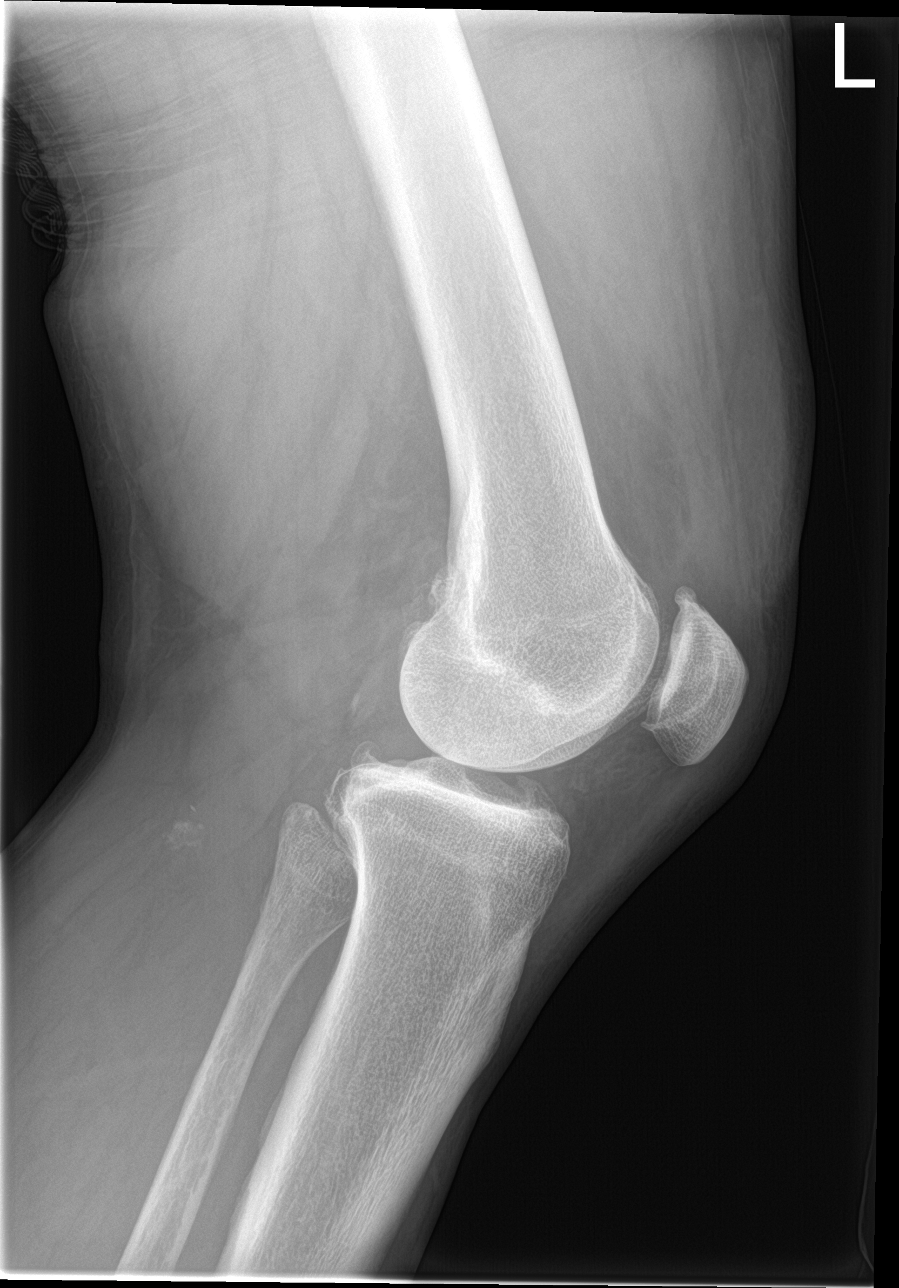

[knee obl (1 of 2)]
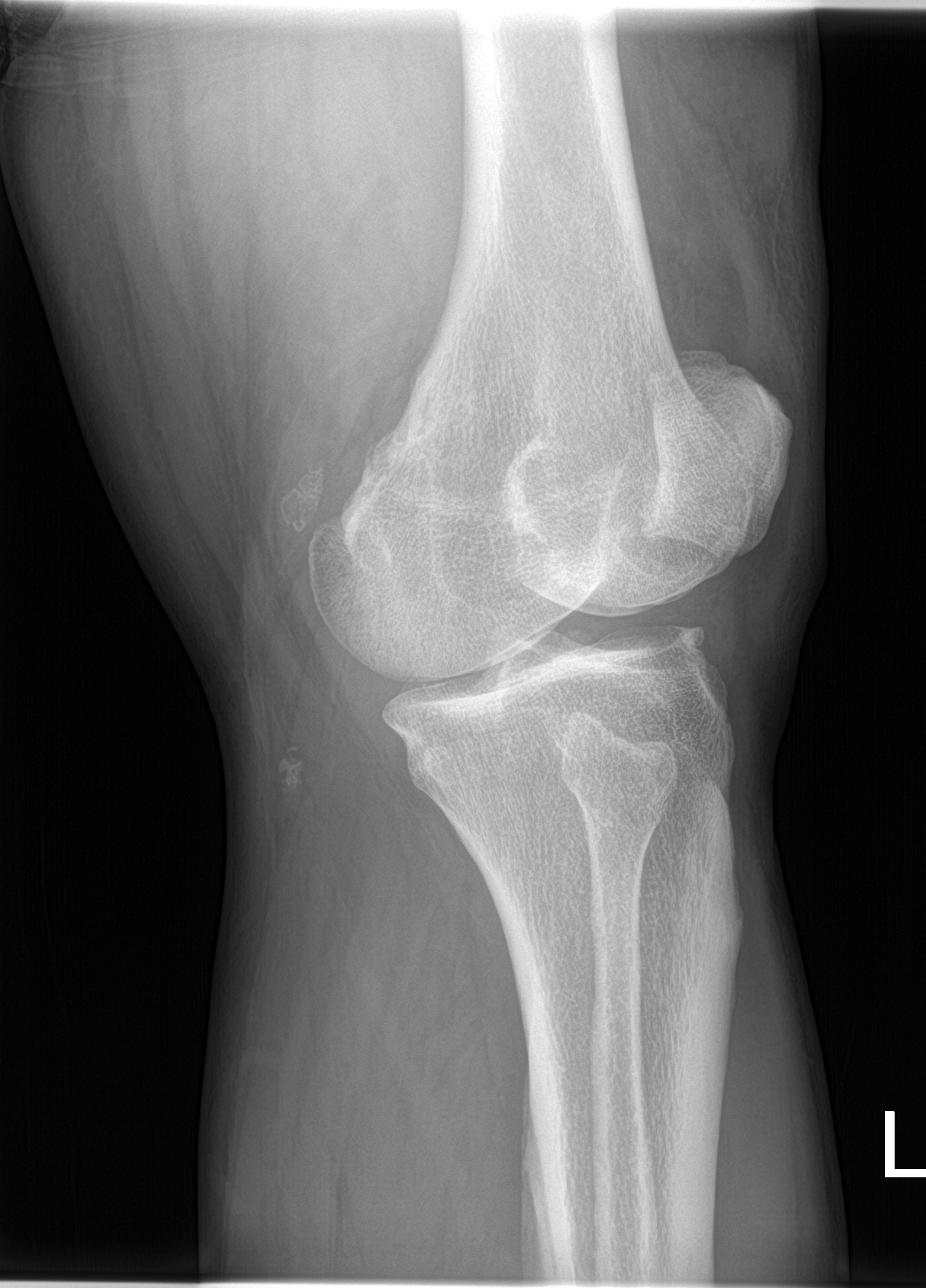

[knee obl (2 of 2)]
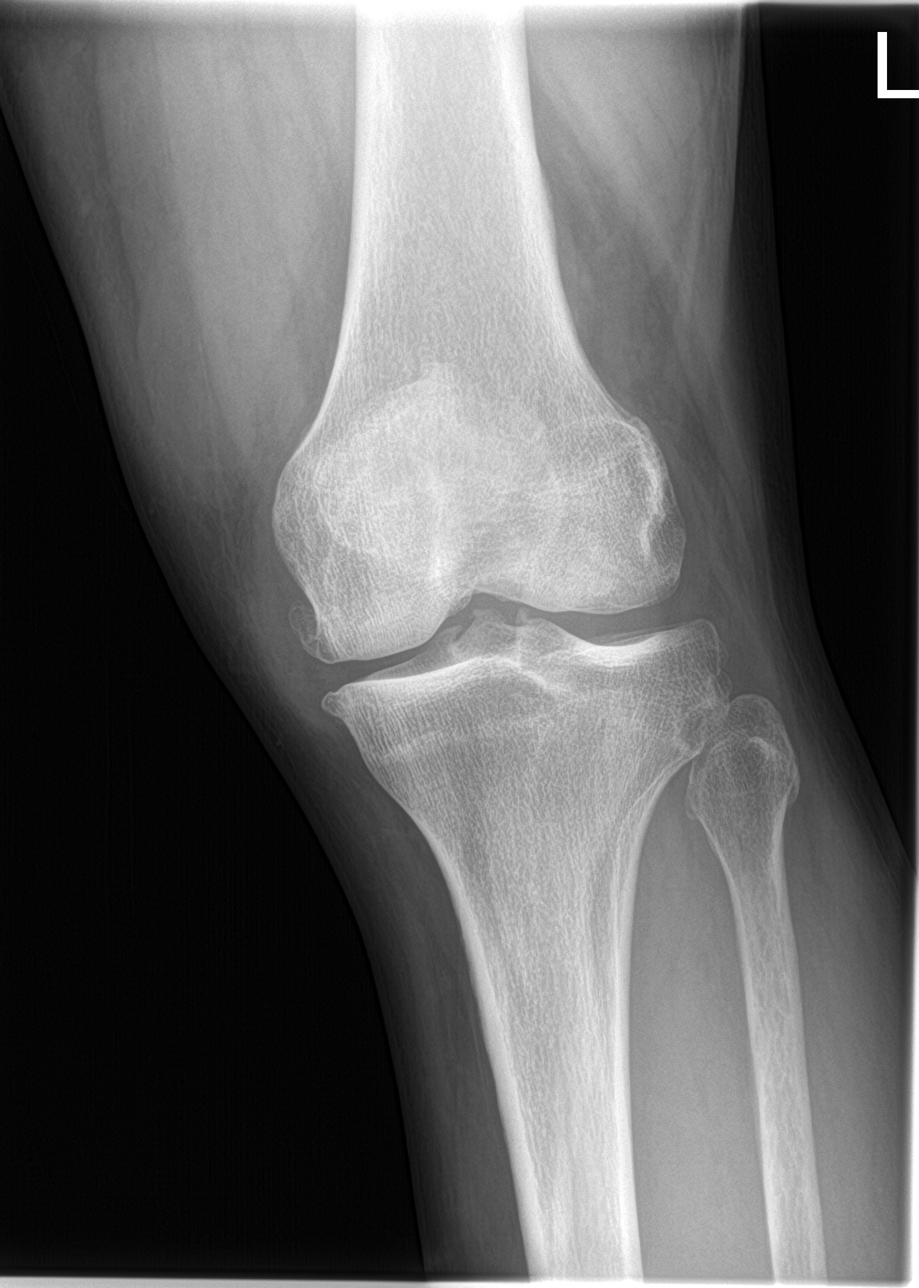

[4 of 4 positions shown; findings below may reference images not displayed]

FINDINGS: No evidence of fracture, dislocation, or joint effusion. No
significant joint space narrowing is noted. However, minimal
osteophyte formation is noted medially as well as involving the
tibial spine. Soft tissues are unremarkable.
IMPRESSION: Minimal degenerative changes as described above. No acute
abnormality seen in the left knee.

## 2020-12-06 ENCOUNTER — Other Ambulatory Visit: Payer: Self-pay | Admitting: Family Medicine

## 2020-12-06 DIAGNOSIS — E349 Endocrine disorder, unspecified: Secondary | ICD-10-CM

## 2020-12-06 MED ORDER — TESTOSTERONE CYPIONATE 200 MG/ML IM SOLN: 200.0000 mg | INTRAMUSCULAR | 1 refills | Status: DC

## 2020-12-08 ENCOUNTER — Other Ambulatory Visit (INDEPENDENT_AMBULATORY_CARE_PROVIDER_SITE_OTHER): Payer: BC Managed Care – PPO

## 2020-12-08 ENCOUNTER — Other Ambulatory Visit: Payer: Self-pay

## 2020-12-08 DIAGNOSIS — E875 Hyperkalemia: Secondary | ICD-10-CM

## 2020-12-08 LAB — COMPREHENSIVE METABOLIC PANEL
ALT: 29 U/L (ref 0–53)
AST: 23 U/L (ref 0–37)
Albumin: 4.3 g/dL (ref 3.5–5.2)
Alkaline Phosphatase: 64 U/L (ref 39–117)
BUN: 9 mg/dL (ref 6–23)
CO2: 27 mEq/L (ref 19–32)
Calcium: 9.3 mg/dL (ref 8.4–10.5)
Chloride: 101 mEq/L (ref 96–112)
Creatinine, Ser: 0.82 mg/dL (ref 0.40–1.50)
GFR: 103.25 mL/min (ref >60.00)
Glucose, Bld: 94 mg/dL (ref 70–99)
Potassium: 4.5 mEq/L (ref 3.5–5.1)
Sodium: 137 mEq/L (ref 135–145)
Total Bilirubin: 0.6 mg/dL (ref 0.2–1.2)
Total Protein: 7 g/dL (ref 6.0–8.3)

## 2020-12-21 ENCOUNTER — Encounter: Payer: Self-pay | Admitting: Family Medicine

## 2020-12-21 ENCOUNTER — Other Ambulatory Visit: Payer: Self-pay

## 2020-12-21 DIAGNOSIS — M7710 Lateral epicondylitis, unspecified elbow: Secondary | ICD-10-CM

## 2020-12-21 NOTE — Telephone Encounter (Signed)
Referral placed.

## 2020-12-30 ENCOUNTER — Encounter: Payer: Self-pay | Admitting: Family Medicine

## 2020-12-30 ENCOUNTER — Ambulatory Visit: Payer: Self-pay

## 2020-12-30 ENCOUNTER — Ambulatory Visit (INDEPENDENT_AMBULATORY_CARE_PROVIDER_SITE_OTHER): Payer: BC Managed Care – PPO | Admitting: Family Medicine

## 2020-12-30 ENCOUNTER — Other Ambulatory Visit: Payer: Self-pay

## 2020-12-30 VITALS — Ht 68.0 in | Wt 215.0 lb

## 2020-12-30 DIAGNOSIS — M25521 Pain in right elbow: Secondary | ICD-10-CM | POA: Diagnosis not present

## 2020-12-30 NOTE — Progress Notes (Signed)
PCP: Mosie Lukes, MD  Subjective:   HPI: Patient is a 50 y.o. male here for evaluation of right lateral elbow pain after an acute injury.  He reports that he has been dealing with some lateral epicondylitis for some time, however a few days ago, he was lifting a really heavy tree stump, when he felt a sudden pop in his right lateral elbow/proximal forearm.  He points to his anterior lateral elbow as the location of his pain.  It does not feel similar to his lateral epicondylitis.  He has actually had a distal biceps tendon rupture in the past status post repair and he does not feel like this is similar to that.  Since the injury, he has had significant pain especially with pronation and supination of the wrist.  It is tender to palpation as well.  He does not have any pain or weakness with flexion at the elbow or with bicep curls.  Some pain with gripping.  No numbness or tingling or weakness that he can detect.   Review of Systems:  Per HPI.   [place], medications and smoking status reviewed.      Objective:  Physical Exam:  No flowsheet data found.   Gen: awake, alert, NAD, comfortable in exam room Pulm: breathing unlabored  Right elbow Inspection: No erythema, ecchymosis.  He may have a small amount of swelling over the anterolateral proximal forearm over the area of pain. Active ROM: Full range of motion to flexion, extension, pronation, supination.   Passive ROM: Full range of motion to flexion, extension, pronation, supination.  Strength: 5/5 strength to resisted flexion, extension. He has significant pain with resisted pronation and supination. Tenderness: No bony tenderness to olecranon, medial epicondyle, lateral epicondyle, radial head, extensor bundle insertion, flexor bundle insertion.  He does have tenderness to palpation more distally in the extensor bundle muscle belly approximately 5 to 7 cm distal to the lateral epicondyle. Special tests: Stable to varus/valgus  stress. No pain with resisted wrist extension. No pain with resisted wrist flexion. No interosseous tenderness or pain with maneuvers  Limited US examination of the right elbow: -Lateral epicondyle visualized and shows common extensor tendon attaching to it without significant realities.  There is a small intrasubstance tear consistent with chronic lateral colitis, otherwise no abnormalities. -Attention was then drawn to the area of maximal tenderness more distally and medially.  Brachioradialis visualized and does not show abnormalities.  Supinator muscle visualized and does show a small area of hypoechogenicity within the muscle at the area of pain.  Supinator muscle does appear intact and he has full pronation supination.  Pronator teres visualized and appears normal.  Impression: -Small area of hypoechogenicity within the supinator muscle, indicative of partial tear of the supinator in the muscle belly.    Assessment & Plan:  1.  Right supinator intramuscular partial tear  Patient with pain with pronation and supination.  The pain appears to be in the area of the supinator musculature versus brachial radialis.  Given the characterization of pain with pronation supination, and ultrasound findings, I suspect partial tear of the supinator musculature.  He is also having some numbness and tingling going down his arm which may be secondary to radial tunnel syndrome in the setting of supinator muscle edema.  We discussed that the main treatment for this is time, also give him some gentle pronation and supination exercises with light weights as he progresses.  Avoid aggravating activities.  Okay to try topical Voltaren and  continue icing.  Follow-up as needed.   Dagoberto Ligas, MD [facility] 12/30/2020 4:07 PM  Addendum:  I was the preceptor for this visit and available for immediate consultation.  Karlton Lemon MD Kirt Boys

## 2021-01-22 ENCOUNTER — Encounter: Payer: Self-pay | Admitting: *Deleted

## 2021-02-08 ENCOUNTER — Encounter: Payer: Self-pay | Admitting: Family Medicine

## 2021-02-10 ENCOUNTER — Telehealth: Payer: Self-pay | Admitting: *Deleted

## 2021-02-10 ENCOUNTER — Ambulatory Visit (AMBULATORY_SURGERY_CENTER): Payer: Self-pay | Admitting: *Deleted

## 2021-02-10 ENCOUNTER — Other Ambulatory Visit: Payer: Self-pay

## 2021-02-10 VITALS — Ht 68.0 in | Wt 223.0 lb

## 2021-02-10 DIAGNOSIS — Z1211 Encounter for screening for malignant neoplasm of colon: Secondary | ICD-10-CM

## 2021-02-10 NOTE — Telephone Encounter (Signed)
Anthony Reese  home 02 use per patient - pt states he uses 02 at home only with cluster headaches- not used due to any respiratory issues- has not used home 02 since last fall  [name] for [facility] colon or does he need an OV   [name]

## 2021-02-10 NOTE — Progress Notes (Signed)
No egg or soy allergy known to patient  No issues with past sedation with any surgeries or procedures Patient denies ever being told they had issues or difficulty with intubation  No FH of Malignant Hyperthermia No diet pills per patient   home 02 use per patient - pt states he uses 02 at home only with cluster headaches- not used due to any respiratory issues- has not used home 02 since last fall - te to [facility]   No blood thinners per patient  Pt denies issues with constipation  No A fib or A flutter  EMMI video to pt or via [place] 19 guidelines implemented in PV today with Pt and RN  Pt is fully vaccinated  for Covid    Due to the COVID-19 pandemic we are asking patients to follow certain guidelines.  Pt aware of COVID protocols and [facility] guidelines

## 2021-02-10 NOTE — Telephone Encounter (Signed)
Noted  

## 2021-02-24 ENCOUNTER — Encounter: Payer: BC Managed Care – PPO | Admitting: Gastroenterology

## 2021-03-01 ENCOUNTER — Encounter: Payer: Self-pay | Admitting: Family Medicine

## 2021-03-19 ENCOUNTER — Ambulatory Visit (INDEPENDENT_AMBULATORY_CARE_PROVIDER_SITE_OTHER): Payer: BC Managed Care – PPO | Admitting: Medical

## 2021-03-19 ENCOUNTER — Other Ambulatory Visit: Payer: Self-pay

## 2021-03-19 ENCOUNTER — Encounter: Payer: Self-pay | Admitting: Family Medicine

## 2021-03-19 VITALS — BP 130/90 | HR 97 | Resp 18 | Ht 68.0 in | Wt 224.0 lb

## 2021-03-19 DIAGNOSIS — M542 Cervicalgia: Secondary | ICD-10-CM | POA: Diagnosis not present

## 2021-03-19 DIAGNOSIS — R0789 Other chest pain: Secondary | ICD-10-CM | POA: Diagnosis not present

## 2021-03-19 DIAGNOSIS — S46812S Strain of other muscles, fascia and tendons at shoulder and upper arm level, left arm, sequela: Secondary | ICD-10-CM

## 2021-03-19 LAB — TROPONIN I: Troponin I: 4 ng/L (ref <=47)

## 2021-03-19 MED ORDER — DICLOFENAC SODIUM 75 MG PO TBEC: 75.0000 mg | DELAYED_RELEASE_TABLET | Freq: Two times a day (BID) | ORAL | 0 refills | Status: DC

## 2021-03-19 MED ORDER — KETOROLAC TROMETHAMINE 60 MG/2ML IM SOLN
60.0000 mg | Freq: Once | INTRAMUSCULAR | Status: AC
  Administered 2021-03-19: 60 mg via INTRAMUSCULAR

## 2021-03-19 MED ORDER — CYCLOBENZAPRINE HCL 10 MG PO TABS: 10.0000 mg | ORAL_TABLET | Freq: Every day | ORAL | 0 refills | Status: DC

## 2021-03-19 NOTE — Telephone Encounter (Signed)
Patient scheduled for today with Percell Miller at 240pm

## 2021-03-19 NOTE — Patient Instructions (Addendum)
You do have report of some neck pain recently that is moderate to severe.  Some features do support muscular type pain in that your pain worsens when you turn your head to the rt and tilt head to the rt(possible cervical spine causes well).  Also you describe some possible radiating pain from neck to the left elbow.    Will give Toradol 60 mg IM injection.  Stop meloxicam and start diclofenac tomorrow morning.  Prescription of Flexeril 10 mg to use at night.  You do have some atypical features associated with the neck pain.  That being numbness to left upper chest for months.  Also some recent tingling to left side of face.  We did EKG today and it showed sinus rhythm with no ischemic changes.  As we approached the weekend do think best to get one set of stat troponin.  If your study is negative and your symptoms change or worsen despite the above treatment then recommend ED evaluation for further work.   Follow up next Tuesday or wed or as needed

## 2021-03-19 NOTE — Progress Notes (Addendum)
Subjective:    Patient ID: Anthony Reese, male    DOB: [birth year], 50 y.o.   MRN: [id number]  HPI  Pt has left neck and trapezius pain. Pain from upper neck occipatal area than shoot down. Some pain on moving head to rt and tilting his head to rt. Some pain that runs toward left elbow.  Pt went to chiropracter yesterday but did not help.  No overt obvious upper chest no pain but upper chest feels numb for 2 month. He  states 2 moth ago had neck pain similar to this but briefer. Around that time had some chest numbness type sensation that still persists. No pressure, pain or palpitation.  States left side face felt tingly today. No rash and no teeth pain. Not reporting jaw pain.  Pt has high cholesterol. Hx smoking in past. States moderate. 12 years off and on. 2-3 cigarettes a day. Not diabetic. No fh cad.  Meloxicam has not helped much   Review of Systems  Constitutional:  Negative for chills, diaphoresis, fatigue and fever.  Respiratory:  Negative for cough, chest tightness and wheezing.   Cardiovascular:  Negative for chest pain and palpitations.       Very atypical odd numb sensaton to left upper chest for month.  Gastrointestinal:  Negative for abdominal distention and abdominal pain.  Musculoskeletal:  Positive for neck pain. Negative for back pain and myalgias.  Skin:  Negative for rash.  Neurological:        Slight face tingle left side.  Hematological:  Negative for adenopathy.  Psychiatric/Behavioral:  Negative for behavioral problems, confusion and dysphoric mood. The patient is not nervous/anxious.    Past Medical History:  Diagnosis Date   Allergy    Arthritis    Chicken pox    H/O tobacco use, presenting hazards to health 07/31/2011   Headache, cluster, episodic    hx over a yr   Injury of left shoulder 01/30/2013   Low testosterone    Nasal polyp 11/18/2013   On home oxygen therapy    ONLY uses for cluster headaches   Other and unspecified hyperlipidemia  11/18/2013   Overweight(278.02)    Preventative health care 11/18/2013   Radiculopathy affecting upper extremity 11/20/2011   Tobacco use disorder 07/31/2011     Social History   Socioeconomic History   Marital status: Married    Spouse name: Not on file   Number of children: Not on file   Years of education: Not on file   Highest education level: Not on file  Occupational History   Not on file  Tobacco Use   Smoking status: Former    Packs/day: 0.25    Years: 3.00    Pack years: 0.75    Types: Cigarettes    Quit date: 01/31/2012    Years since quitting: 9.1   Smokeless tobacco: Never  Substance and Sexual Activity   Alcohol use: Yes    Alcohol/week: 14.0 standard drinks    Types: 14 Cans of beer per week    Comment: social   Drug use: No   Sexual activity: Yes    Partners: Female    Comment: lives with wife, works for [employer], no dietary restrictions.  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   Procedure: RIGHT TOTAL KNEE ARTHROPLASTY;  Surgeon: Dorna Leitz, MD;  Location: [place];  Service: Orthopedics;  Laterality: Right;    Family History  Problem Relation Age of Onset   Cancer Mother        leukemia, pancreatic cancer   Cholelithiasis Sister    Cancer Maternal Aunt        lung   Colon polyps Maternal Grandfather    Colon cancer Maternal  Grandfather    Cancer Maternal Grandfather        colon    Esophageal cancer Neg Hx    Stomach cancer Neg Hx    Rectal cancer Neg Hx     Allergies  Allergen Reactions   Imitrex [Sumatriptan]     Swelling around eyes and SOb   Penicillins     Has patient had a PCN reaction causing immediate rash, facial/tongue/throat swelling, SOB or lightheadedness with hypotension: {unknown Has patient had a PCN reaction causing severe rash involving mucus membranes or skin necrosis: {unknown Has patient had a PCN reaction that required hospitalization {unknwon Has patient had a PCN reaction occurring within the last 10 years: no If all of the above answers are "NO", then may proceed with Cephalosporin use.   Verapamil     Current Outpatient Medications on File Prior to Visit  Medication Sig Dispense Refill   fluticasone (FLONASE) 50 MCG/ACT nasal spray Place 2 sprays into both nostrils daily. 16 g 3   rosuvastatin (CRESTOR) 40 MG tablet Take 1 tablet (40 mg total) by mouth daily. 90 tablet 1   testosterone cypionate (DEPOTESTOSTERONE CYPIONATE) 200 MG/ML injection Inject 1 mL (200 mg total) into the muscle every 14 (fourteen) days. 12 mL 1   No current facility-administered medications on file prior to visit.    BP 130/90   Pulse 97   Resp 18   Ht '5\' 8"'$  (1.727 m)   Wt 224 lb (101.6 kg)   SpO2 99%   BMI 34.06 kg/m        Objective:   Physical Exam  General Mental Status- Alert. General Appearance- Not in acute distress.   Skin General: Color- Normal Color. Moisture- Normal Moisture.  Neck Carotid Arteries- Normal color. Moisture- Normal Moisture. No carotid bruits. No JVD. No mid cervical spine tenderness. Left side neck pain reported. But can't specifically provoke on exam. He grabs back of his neck during interview. Turning head to rt and tilting head to rt cause left side trap pain near shoulder.  Chest and Lung Exam Auscultation: Breath  Sounds:-Normal.  Cardiovascular Auscultation:Rythm- Regular. Murmurs & Other Heart Sounds:Auscultation of the heart reveals- No Murmurs.  Abdomen Inspection:-Inspeection Normal. Palpation/Percussion:Note:No mass. Palpation and Percussion of the abdomen reveal- Non Tender, Non Distended + BS, no rebound or guarding.  Anterior chest- no pain on palpation. He does state feel slight numb as it has for months.  Neurologic Cranial Nerve exam:- CN III-XII intact(No nystagmus), symmetric smile. Strength:- 5/5 equal and symmetric strength both upper and lower  extremities.   Left shoulder- good rom. No pain.      Assessment & Plan:   Patient Instructions  You do have report of some neck pain recently that is moderate to severe.  Some features do support muscular type pain in that your pain worsens when you turn your head to the rt and tilt head to the rt(possible cervical spine causes well).  Also you describe some possible radiating pain from neck to the left elbow.    Will give Toradol 60 mg IM injection.  Stop meloxicam and start diclofenac tomorrow morning.  Prescription of Flexeril 10 mg to use at night.  You do have some atypical features associated with the neck pain.  That being numbness to left upper chest for months.  Also some recent tingling to left side of face.  We did EKG today and it showed sinus rhythm with no ischemic changes.  As we approached the weekend do think best to get one set of stat troponin.  If your study is negative and your symptoms change or worsen despite the above treatment then recommend ED evaluation for further work.   Follow up next Tuesday or wed or as needed   [employer], PA-C    Time spent was  42 minutes which consisted of chart review, discussing diagnosis, work up, treatment, explaining how would approach very atypical chest sensation(possible pain??)and documentation.

## 2021-03-19 NOTE — Progress Notes (Addendum)
PCP - Gwyneth Revels , MD clearance on chart 01-21-21 with last OVN 11-23-20 Cardiologist - no  PPM/ICD -  Device Orders -  Rep Notified -   Chest x-ray -  EKG -  Stress Test -  ECHO -  Cardiac Cath -   Sleep Study -  CPAP -   Fasting Blood Sugar -  Checks Blood Sugar _____ times a day  Blood Thinner Instructions: Aspirin Instructions:  ERAS Protcol - PRE-SURGERY Ensure   COVID TEST- 04-05-21 COVID vaccine -x2 pfizer  Activity--Able to walk a flight of stairs without SOB Anesthesia review:   Patient denies shortness of breath, fever, cough and chest pain at PAT appointment   All instructions explained to the patient, with a verbal understanding of the material. Patient agrees to go over the instructions while at home for a better understanding. Patient also instructed to self quarantine after being tested for COVID-19. The opportunity to ask questions was provided.

## 2021-03-19 NOTE — Patient Instructions (Addendum)
DUE TO COVID-19 ONLY ONE VISITOR IS ALLOWED TO COME WITH YOU AND STAY IN THE WAITING ROOM ONLY DURING PRE OP AND PROCEDURE DAY OF SURGERY.   TWO VISITOR  MAY VISIT WITH YOU AFTER SURGERY IN YOUR PRIVATE ROOM DURING VISITING HOURS ONLY!  YOU NEED TO HAVE A COVID 19 TEST ON___10-3-22____between 8am-3pm______, THIS TEST MUST BE DONE BEFORE SURGERY,     Please bring completed form with you to the COVID testing site   COVID TESTING SITE [place] TEST IS COMPLETED,  PLEASE Wear a mask when in public           Your procedure is scheduled on: 04-07-21   Report to [facility] Main  Entrance   Report to admitting at       0925 AM     Call this number if you have problems the morning of surgery [phone]   Remember: NO SOLID FOOD AFTER MIDNIGHT THE NIGHT PRIOR TO SURGERY. NOTHING BY MOUTH EXCEPT CLEAR LIQUIDS UNTIL      0900 am .   PLEASE FINISH ENSURE DRINK PER SURGEON ORDER  WHICH NEEDS TO BE COMPLETED AT      0900 am then nothing by mouth.     CLEAR LIQUID DIET                                                                    water Black Coffee and tea, regular and decaf  no creamer                           Plain Jell-O any favor except red or purple                                  Fruit ices (not with fruit pulp)                                      Iced Popsicles                                     Carbonated beverages, regular and diet                                    Cranberry, grape and apple juices Sports drinks like Gatorade Lightly seasoned clear broth or consume(fat free) Sugar, honey syrup   _____________________________________________________________________     BRUSH YOUR TEETH MORNING OF SURGERY AND RINSE YOUR MOUTH OUT, NO CHEWING GUM CANDY OR MINTS.     Take these medicines the morning of surgery with A SIP OF WATER: crestor                                 You may not have any  metal on your body including hair pins  and              piercings  Do not wear jewelry, lotions, powders,perfumes,      deodorant                      Men may shave face and neck.   Do not bring valuables to the hospital. [place].  Contacts, dentures or bridgework may not be worn into surgery.       Patients discharged the day of surgery will not be allowed to drive home. IF YOU ARE HAVING SURGERY AND GOING HOME THE SAME DAY, YOU MUST HAVE AN ADULT TO DRIVE YOU HOME AND BE WITH YOU FOR 24 HOURS. YOU MAY GO HOME BY TAXI OR UBER OR ORTHERWISE, BUT AN ADULT MUST ACCOMPANY YOU HOME AND STAY WITH YOU FOR 24 HOURS.  Name and phone number of your driver:  Special Instructions: N/A              Please read over the following fact sheets you were given: _____________________________________________________________________             [facility] - Preparing for Surgery Before surgery, you can play an important role.  Because skin is not sterile, your skin needs to be as free of germs as possible.  You can reduce the number of germs on your skin by washing with CHG (chlorahexidine gluconate) soap before surgery.  CHG is an antiseptic cleaner which kills germs and bonds with the skin to continue killing germs even after washing. Please DO NOT use if you have an allergy to CHG or antibacterial soaps.  If your skin becomes reddened/irritated stop using the CHG and inform your nurse when you arrive at Short Stay. Do not shave (including legs and underarms) for at least 48 hours prior to the first CHG shower.  You may shave your face/neck. Please follow these instructions carefully:  1.  Shower with CHG Soap the night before surgery and the  morning of Surgery.  2.  If you choose to wash your hair, wash your hair first as usual with your  normal  shampoo.  3.  After you shampoo, rinse your hair and body thoroughly to remove the  shampoo.                            4.  Use CHG as you would any other liquid soap.  You can apply chg directly  to the skin and wash                       Gently with a scrungie or clean washcloth.  5.  Apply the CHG Soap to your body ONLY FROM THE NECK DOWN.   Do not use on face/ open                           Wound or open sores. Avoid contact with eyes, ears mouth and genitals (private parts).                       Wash face,  Genitals (private parts) with your normal soap.             6.  Wash thoroughly, paying special attention to the  area where your surgery  will be performed.  7.  Thoroughly rinse your body with warm water from the neck down.  8.  DO NOT shower/wash with your normal soap after using and rinsing off  the CHG Soap.                9.  Pat yourself dry with a clean towel.            10.  Wear clean pajamas.            11.  Place clean sheets on your bed the night of your first shower and do not  sleep with pets. Day of Surgery : Do not apply any lotions/deodorants the morning of surgery.  Please wear clean clothes to the hospital/surgery center.  FAILURE TO FOLLOW THESE INSTRUCTIONS MAY RESULT IN THE CANCELLATION OF YOUR SURGERY PATIENT SIGNATURE_________________________________  NURSE SIGNATURE__________________________________  ________________________________________________________________________    Anthony Reese  An incentive spirometer is a tool that can help keep your lungs clear and active. This tool measures how well you are filling your lungs with each breath. Taking long deep breaths may help reverse or decrease the chance of developing breathing (pulmonary) problems (especially infection) following: A long period of time when you are unable to move or be active. BEFORE THE PROCEDURE  If the spirometer includes an indicator to show your best effort, your nurse or respiratory therapist will set it to a desired goal. If possible, sit up straight or lean slightly forward. Try  not to slouch. Hold the incentive spirometer in an upright position. INSTRUCTIONS FOR USE  Sit on the edge of your bed if possible, or sit up as far as you can in bed or on a chair. Hold the incentive spirometer in an upright position. Breathe out normally. Place the mouthpiece in your mouth and seal your lips tightly around it. Breathe in slowly and as deeply as possible, raising the piston or the ball toward the top of the column. Hold your breath for 3-5 seconds or for as long as possible. Allow the piston or ball to fall to the bottom of the column. Remove the mouthpiece from your mouth and breathe out normally. Rest for a few seconds and repeat Steps 1 through 7 at least 10 times every 1-2 hours when you are awake. Take your time and take a few normal breaths between deep breaths. The spirometer may include an indicator to show your best effort. Use the indicator as a goal to work toward during each repetition. After each set of 10 deep breaths, practice coughing to be sure your lungs are clear. If you have an incision (the cut made at the time of surgery), support your incision when coughing by placing a pillow or rolled up towels firmly against it. Once you are able to get out of bed, walk around indoors and cough well. You may stop using the incentive spirometer when instructed by your caregiver.  RISKS AND COMPLICATIONS Take your time so you do not get dizzy or light-headed. If you are in pain, you may need to take or ask for pain medication before doing incentive spirometry. It is harder to take a deep breath if you are having pain. AFTER USE Rest and breathe slowly and easily. It can be helpful to keep track of a log of your progress. Your caregiver can provide you with a simple table to help with this. If you are using the spirometer at home, follow these instructions: SEEK  MEDICAL CARE IF:  You are having difficultly using the spirometer. You have trouble using the spirometer as  often as instructed. Your pain medication is not giving enough relief while using the spirometer. You develop fever of 100.5 F (38.1 C) or higher. SEEK IMMEDIATE MEDICAL CARE IF:  You cough up bloody sputum that had not been present before. You develop fever of 102 F (38.9 C) or greater. You develop worsening pain at or near the incision site. MAKE SURE YOU:  Understand these instructions. Will watch your condition. Will get help right away if you are not doing well or get worse. Document Released: 10/31/2006 Document Revised: 09/12/2011 Document Reviewed: 01/01/2007 [facility] Patient Information 2014 [place], [place].   ________________________________________________________________________

## 2021-03-23 ENCOUNTER — Ambulatory Visit: Payer: BC Managed Care – PPO | Admitting: Medical

## 2021-03-25 ENCOUNTER — Other Ambulatory Visit: Payer: Self-pay

## 2021-03-25 ENCOUNTER — Encounter (HOSPITAL_COMMUNITY)
Admission: RE | Admit: 2021-03-25 | Discharge: 2021-03-25 | Disposition: A | Discharge status: Home / Self Care | Payer: BC Managed Care – PPO | Source: Ambulatory Visit | Attending: Orthopedic Surgery | Admitting: Orthopedic Surgery

## 2021-03-25 ENCOUNTER — Encounter: Payer: Self-pay | Admitting: Medical

## 2021-03-25 ENCOUNTER — Encounter (HOSPITAL_COMMUNITY): Payer: Self-pay

## 2021-03-25 DIAGNOSIS — Z01812 Encounter for preprocedural laboratory examination: Secondary | ICD-10-CM | POA: Insufficient documentation

## 2021-03-25 LAB — CBC
HCT: 50 % (ref 39.0–52.0)
Hemoglobin: 16.8 g/dL (ref 13.0–17.0)
MCH: 32.9 pg (ref 26.0–34.0)
MCHC: 33.6 g/dL (ref 30.0–36.0)
MCV: 98 fL (ref 80.0–100.0)
Platelets: 308 10*3/uL (ref 150–400)
RBC: 5.1 MIL/uL (ref 4.22–5.81)
RDW: 12.9 % (ref 11.5–15.5)
WBC: 7.9 10*3/uL (ref 4.0–10.5)
nRBC: 0 % (ref 0.0–0.2)

## 2021-03-25 LAB — COMPREHENSIVE METABOLIC PANEL
ALT: 29 U/L (ref 0–44)
AST: 24 U/L (ref 15–41)
Albumin: 4.2 g/dL (ref 3.5–5.0)
Alkaline Phosphatase: 64 U/L (ref 38–126)
Anion gap: 10 (ref 5–15)
BUN: 15 mg/dL (ref 6–20)
CO2: 24 mmol/L (ref 22–32)
Calcium: 9.2 mg/dL (ref 8.9–10.3)
Chloride: 105 mmol/L (ref 98–111)
Creatinine, Ser: 0.79 mg/dL (ref 0.61–1.24)
GFR, Estimated: >60 mL/min (ref >60)
Glucose, Bld: 98 mg/dL (ref 70–99)
Potassium: 4.2 mmol/L (ref 3.5–5.1)
Sodium: 139 mmol/L (ref 135–145)
Total Bilirubin: 0.9 mg/dL (ref 0.3–1.2)
Total Protein: 7.8 g/dL (ref 6.5–8.1)

## 2021-03-25 LAB — TYPE AND SCREEN
ABO/RH(D): A POS
Antibody Screen: NEGATIVE

## 2021-03-25 LAB — SURGICAL PCR SCREEN
MRSA, PCR: NEGATIVE
Staphylococcus aureus: POSITIVE — AB

## 2021-03-25 LAB — PROTIME-INR
INR: 1 (ref 0.8–1.2)
Prothrombin Time: 13.5 seconds (ref 11.4–15.2)

## 2021-03-25 NOTE — Telephone Encounter (Signed)
Patient last seen 03/19/2021

## 2021-04-05 ENCOUNTER — Other Ambulatory Visit: Payer: Self-pay | Admitting: Orthopedic Surgery

## 2021-04-06 LAB — SARS CORONAVIRUS 2 (TAT 6-24 HRS): SARS Coronavirus 2: NEGATIVE

## 2021-04-06 NOTE — H&P (Signed)
Anthony Reese ADMISSION H&P  Patient is being admitted for right Anthony knee arthroplasty Reese.  Subjective:  Chief Complaint: Right knee pain.  HPI: Tel Anthony Reese, 50 y.o. male     [name] comes to clinic for a recheck of his right Anthony knee arthroplasty. He was previously evaluated 3/1 by Molli Barrows and sent for a CBC, ESR, C-reactive protein, and a bone scan. He completed the bone scan 09/23/2020. he reports he has not seen much improvement in his knee. He takes meloxicam for pain.   Mr. Anthony Reese is a pleasant 50 year old male who comes to the clinic for follow-up status post right Anthony knee arthroplasty. He indicates he has been experiencing swelling in his knee. He notes having an aspiration of the knee and the swelling returned. He states it is not as bad. He notes he has been experiencing constant pain for 4 years and swelling in the posterior aspect of his knee. He states the knee will feel unstable and he has to be careful with what he does. He denies any warmth or warmth in his knee.    The patient indicates he had his ACL surgery when he was 50 years old. He notes an injury when he was 50 years old. The patient indicates his knee seemed to be doing well after surgery. He notes he attended physical therapy after his Anthony knee arthroplasty and was told his knee seemed to be doing well. He does not the Baker's cyst did not resolve. The patient states within the first year, he had fluid aspirated. He states they continued to aspirate but nothing else was done. He notes being given meloxicam for pain/swelling He notes he has been wearing a brace, on the right and left, which provides mild relief.    The patient states he was off work for 3 months after the Anthony knee arthroplasty.   We discussed the patient's presenting complaints, history, and treatment options. I do not think this is an infection based on the objective criteria. He has synovitis and swelling from the  instability. He is going to need Reese. It is possible that it could just be a polyethylene Reese, if all of the metal components are stable. It could be as much as a Anthony Reese if the components are loose. We will have to send intraoperative fluid and intraoperative tissue for analysis. If it is an infection, then we are talking about a 2-stage procedure. He is understanding of all of this. He would like to do this later in the year. I told him to gives [place] about 2 to 3 months notice and then we will get him on the schedule. He will call us when he is ready for his surgery.   Patient Active Problem List   Diagnosis Date Noted   Lateral epicondylitis 11/23/2020   Neck pain 11/23/2020   Left knee pain 11/23/2020   Snoring 09/03/2018   Tachycardia 09/03/2018   Right knee pain 03/06/2018   Bee sting reaction 11/23/2017   Primary osteoarthritis of right knee 02/19/2016   Hyperlipidemia 11/18/2013   Allergy 11/18/2013   Nasal polyp 11/18/2013   Colon cancer screening 11/18/2013   Injury of left shoulder 01/30/2013   Radiculopathy affecting upper extremity 11/20/2011   H/O tobacco use, presenting hazards to health 07/31/2011   Headache, cluster, episodic    Testosterone deficiency    Obesity     Past Medical History:  Diagnosis Date   Allergy    Arthritis  Chicken pox    H/O tobacco use, presenting hazards to health 07/31/2011   Headache, cluster, episodic    hx over a yr   Injury of left shoulder 01/30/2013   Low testosterone    Nasal polyp 11/18/2013   On home oxygen therapy    ONLY uses for cluster headaches   Other and unspecified hyperlipidemia 11/18/2013   Overweight(278.02)    Preventative health care 11/18/2013   Radiculopathy affecting upper extremity 11/20/2011   Tobacco use disorder 07/31/2011    Past Surgical History:  Procedure Laterality Date   APPENDECTOMY     BICEPS TENDON REPAIR  2012   right   CARPAL TUNNEL RELEASE Bilateral 2013   right    KNEE SURGERY Right 2013   acl   NASAL SINUS SURGERY  2016   NASAL SINUS SURGERY     x2   Anthony KNEE ARTHROPLASTY Right 02/19/2016   Procedure: RIGHT Anthony KNEE ARTHROPLASTY;  Surgeon: Dorna Leitz, MD;  Location: [facility];  Service: Orthopedics;  Laterality: Right;    Prior to Admission medications   Medication Sig Start Date End Date Taking? Authorizing Provider  cetirizine (ZYRTEC) 10 MG tablet Take 10 mg by mouth 2 (two) times daily as needed for allergies.   Yes [provider]  cyclobenzaprine (FLEXERIL) 10 MG tablet Take 1 tablet (10 mg Anthony) by mouth at bedtime. 03/19/21  Yes Saguier, Percell Miller, PA-C  diclofenac (VOLTAREN) 75 MG EC tablet Take 1 tablet (75 mg Anthony) by mouth 2 (two) times daily. 03/19/21  Yes Saguier, Percell Miller, PA-C  fluticasone [facility]) 50 MCG/ACT nasal spray Place 2 sprays into both nostrils daily as needed for allergies or rhinitis.   Yes [provider]  rosuvastatin (CRESTOR) 40 MG tablet Take 1 tablet (40 mg Anthony) by mouth daily. 11/23/20  Yes Mosie Lukes, MD  testosterone cypionate (DEPOTESTOSTERONE CYPIONATE) 200 MG/ML injection Inject 1 mL (200 mg Anthony) into the muscle every 14 (fourteen) days. 12/06/20  Yes Mosie Lukes, MD    Allergies  Allergen Reactions   Imitrex [Sumatriptan] Anaphylaxis    Swelling around eyes and shortness of breath   Verapamil Anaphylaxis    Swelling around eyes and shortness of breath   Penicillins Other (See Comments)    Unknown reaction as a child.    Social History   Socioeconomic History   Marital status: Married    Spouse name: Not on file   Number of children: Not on file   Years of education: Not on file   Highest education level: Not on file  Occupational History   Not on file  Tobacco Use   Smoking status: Some Days    Packs/day: 0.25    Years: 3.00    Pack years: 0.75    Types: Cigarettes, Cigars    Last attempt to quit: 01/31/2012    Years since quitting: 9.1   Smokeless tobacco:  Never  Vaping Use   Vaping Use: Never used  Substance and Sexual Activity   Alcohol use: Yes    Alcohol/week: 14.0 standard drinks    Types: 14 Cans of beer per week    Comment: 1-2 beer during week and social on weekends   Drug use: No   Sexual activity: Yes    Partners: Female    Comment: lives with wife, works for [employer], no dietary restrictions.  Other Topics Concern   Not on file  Social History Narrative   Not on file   Social Determinants of  Health   Financial Resource Strain: Not on file  Food Insecurity: Not on file  Transportation Needs: Not on file  Physical Activity: Not on file  Stress: Not on file  Social Connections: Not on file  Intimate Partner Violence: Not on file    Tobacco Use: High Risk   Smoking Tobacco Use: Some Days   Smokeless Tobacco Use: Never   Social History   Substance and Sexual Activity  Alcohol Use Yes   Alcohol/week: 14.0 standard drinks   Types: 14 Cans of beer per week   Comment: 1-2 beer during week and social on weekends    Family History  Problem Relation Age of Onset   Cancer Mother        leukemia, pancreatic cancer   Cholelithiasis Sister    Cancer Maternal Aunt        lung   Colon polyps Maternal Grandfather    Colon cancer Maternal Grandfather    Cancer Maternal Grandfather        colon    Esophageal cancer Neg Hx    Stomach cancer Neg Hx    Rectal cancer Neg Hx     ROS: Constitutional: no fever, no chills, no night sweats, no significant weight loss Cardiovascular: no chest pain, no palpitations Respiratory: no cough, no shortness of breath, No COPD Gastrointestinal: no vomiting, no nausea Musculoskeletal: no swelling in Joints, Joint Pain Neurologic: no numbness, no tingling, no difficulty with balance   Objective:  Physical Exam: Well nourished and well developed.  General: Alert and oriented x3, cooperative and pleasant, no acute distress.  Head: normocephalic,  atraumatic, neck supple.  Eyes: EOMI.  Respiratory: breath sounds clear in all fields, no wheezing, rales, or rhonchi. Cardiovascular: Regular rate and rhythm, no murmurs, gallops or rubs.  Abdomen: non-tender to palpation and soft, normoactive bowel sounds. Musculoskeletal:  Right knee exam:  Large effusion and Baker's cyst.  No warmth about the knee.  The range of motion: -5 to 120 degrees of flexion.  Significant varus/valgus laxity in extension.  Significant AP laxity in flexion.    The patient's sensation and motor function is intact in their lower extremities. Their distal pulses are 2+. The bilateral calves are soft and nontender.        Reviewing his plain radiographs shows a DePuy Attune prosthesis in excellent position. No definitive periprosthetic abnormalities. He has screws from his previous ACL reconstruction that are still in place.    Calves soft and nontender. Motor function intact in LE. Strength 5/5 LE bilaterally. Neuro: Distal pulses 2+. Sensation to light touch intact in LE.  Bone scan dated 09/23/2020 is reviewed. He does have increased uptake in all three phases, consistent with possible loosening or infection.   Vital signs in last 24 hours:    Imaging Review  Bone scan dated 09/23/2020 is reviewed. He does have increased uptake in all three phases, consistent with possible loosening or infection.   Assessment/Plan:  End stage arthritis, right knee   The patient history, physical examination, clinical judgment of the provider and imaging studies are consistent with end stage degenerative joint disease of the right knee and Anthony knee arthroplasty is deemed medically necessary. The treatment options including medical management, injection therapy arthroscopy and arthroplasty were discussed at length. The risks and benefits of Anthony knee arthroplasty were presented and reviewed. The risks due to aseptic loosening, infection, stiffness, patella tracking  problems, thromboembolic complications and other imponderables were discussed. The patient acknowledged the explanation, agreed to proceed  with the plan and consent was signed. Patient is being admitted for inpatient treatment for surgery, pain control, PT, OT, prophylactic antibiotics, VTE prophylaxis, progressive ambulation and ADLs and discharge planning. The patient is planning to be discharged  home .   Patient's anticipated LOS is less than 2 midnights, meeting these requirements: - Younger than 17 - Lives within 1 hour of care - Has a competent adult at home to recover with post-op recover - NO history of  - Chronic pain requiring opiods  - Diabetes  - Coronary Artery Disease  - Heart failure  - Heart attack  - Stroke  - DVT/VTE  - Cardiac arrhythmia  - Respiratory Failure/COPD  - Renal failure  - Anemia  - Advanced Liver disease    Therapy Plans: [facility]  Disposition: Home with  Planned DVT Prophylaxis: Aspirin 325mg  DME Needed: None PCP: Penni Homans, MD (clearance received) TXA: IV Allergies: Verapamil, Sumitriptan (facial edema), PCN  Anesthesia Concerns: None BMI: 33.1 Last HgbA1c: None  Pharmacy: [facility]  - Patient was instructed on what medications to stop prior to surgery. - Follow-up visit in 2 weeks with Dr. Wynelle Link - Begin physical therapy following surgery - Pre-operative lab work as pre-surgical testing - Prescriptions will be provided in hospital at time of discharge  Fenton Foy, [facility], PA-C Orthopedic Surgery [facility] [place]

## 2021-04-07 ENCOUNTER — Ambulatory Visit (HOSPITAL_COMMUNITY): Payer: BC Managed Care – PPO | Admitting: Registered Nurse

## 2021-04-07 ENCOUNTER — Encounter (HOSPITAL_COMMUNITY)
Admission: RE | Disposition: A | Discharge status: Home / Self Care | Payer: Self-pay | Source: Home / Self Care | Attending: Orthopedic Surgery

## 2021-04-07 ENCOUNTER — Encounter (HOSPITAL_COMMUNITY): Payer: Self-pay | Admitting: Orthopedic Surgery

## 2021-04-07 ENCOUNTER — Observation Stay (HOSPITAL_COMMUNITY)
Admission: RE | Admit: 2021-04-07 | Discharge: 2021-04-08 | Disposition: A | Discharge status: Home / Self Care | Payer: BC Managed Care – PPO | Attending: Orthopedic Surgery | Admitting: Orthopedic Surgery

## 2021-04-07 DIAGNOSIS — Z96659 Presence of unspecified artificial knee joint: Secondary | ICD-10-CM

## 2021-04-07 DIAGNOSIS — T84018A Broken internal joint prosthesis, other site, initial encounter: Secondary | ICD-10-CM

## 2021-04-07 DIAGNOSIS — T84012D Broken internal right knee prosthesis, subsequent encounter: Secondary | ICD-10-CM

## 2021-04-07 DIAGNOSIS — F17210 Nicotine dependence, cigarettes, uncomplicated: Secondary | ICD-10-CM | POA: Insufficient documentation

## 2021-04-07 DIAGNOSIS — Z79899 Other long term (current) drug therapy: Secondary | ICD-10-CM | POA: Diagnosis not present

## 2021-04-07 DIAGNOSIS — T84012A Broken internal right knee prosthesis, initial encounter: Secondary | ICD-10-CM

## 2021-04-07 DIAGNOSIS — T84023A Instability of internal left knee prosthesis, initial encounter: Secondary | ICD-10-CM | POA: Diagnosis present

## 2021-04-07 DIAGNOSIS — Y792 Prosthetic and other implants, materials and accessory orthopedic devices associated with adverse incidents: Secondary | ICD-10-CM | POA: Diagnosis not present

## 2021-04-07 HISTORY — PX: TOTAL KNEE REVISION: SHX996

## 2021-04-07 SURGERY — TOTAL KNEE REVISION
Anesthesia: Regional | Site: Knee | Laterality: Right

## 2021-04-07 MED ORDER — TRAMADOL HCL 50 MG PO TABS
50.0000 mg | ORAL_TABLET | Freq: Four times a day (QID) | ORAL | Status: DC | PRN
  Administered 2021-04-08: 100 mg via ORAL
  Filled 2021-04-07: qty 2

## 2021-04-07 MED ORDER — OXYCODONE HCL 5 MG PO TABS: 5.0000 mg | ORAL_TABLET | Freq: Once | ORAL | Status: DC | PRN

## 2021-04-07 MED ORDER — FENTANYL CITRATE PF 50 MCG/ML IJ SOSY
50.0000 ug | PREFILLED_SYRINGE | INTRAMUSCULAR | Status: AC
Start: 2021-04-07 — End: 2021-04-07
  Administered 2021-04-07: 100 ug via INTRAVENOUS
  Filled 2021-04-07: qty 2

## 2021-04-07 MED ORDER — LACTATED RINGERS IV SOLN: INTRAVENOUS | Status: DC

## 2021-04-07 MED ORDER — ROPIVACAINE HCL 5 MG/ML IJ SOLN
INTRAMUSCULAR | Status: DC | PRN
  Administered 2021-04-07: 20 mL via PERINEURAL

## 2021-04-07 MED ORDER — BUPIVACAINE IN DEXTROSE 0.75-8.25 % IT SOLN
INTRATHECAL | Status: DC | PRN
  Administered 2021-04-07: 2 mL via INTRATHECAL

## 2021-04-07 MED ORDER — MENTHOL 3 MG MT LOZG: 1.0000 | LOZENGE | OROMUCOSAL | Status: DC | PRN

## 2021-04-07 MED ORDER — SODIUM CHLORIDE 0.9 % IR SOLN
Status: DC | PRN
  Administered 2021-04-07: 1000 mL

## 2021-04-07 MED ORDER — PROPOFOL 1000 MG/100ML IV EMUL
INTRAVENOUS | Status: AC
  Filled 2021-04-07: qty 100

## 2021-04-07 MED ORDER — MIDAZOLAM HCL 5 MG/5ML IJ SOLN
INTRAMUSCULAR | Status: DC | PRN
  Administered 2021-04-07: 2 mg via INTRAVENOUS

## 2021-04-07 MED ORDER — DIPHENHYDRAMINE HCL 12.5 MG/5ML PO ELIX: 12.5000 mg | ORAL_SOLUTION | ORAL | Status: DC | PRN

## 2021-04-07 MED ORDER — FENTANYL CITRATE (PF) 100 MCG/2ML IJ SOLN
INTRAMUSCULAR | Status: DC | PRN
  Administered 2021-04-07: 100 ug via INTRAVENOUS
  Administered 2021-04-07: 50 ug via INTRAVENOUS

## 2021-04-07 MED ORDER — PHENOL 1.4 % MT LIQD: 1.0000 | OROMUCOSAL | Status: DC | PRN

## 2021-04-07 MED ORDER — DEXAMETHASONE SODIUM PHOSPHATE 10 MG/ML IJ SOLN
INTRAMUSCULAR | Status: AC
  Filled 2021-04-07: qty 1

## 2021-04-07 MED ORDER — DOCUSATE SODIUM 100 MG PO CAPS
100.0000 mg | ORAL_CAPSULE | Freq: Two times a day (BID) | ORAL | Status: DC
  Administered 2021-04-07 – 2021-04-08 (×2): 100 mg via ORAL
  Filled 2021-04-07 (×2): qty 1

## 2021-04-07 MED ORDER — DEXAMETHASONE SODIUM PHOSPHATE 10 MG/ML IJ SOLN: 8.0000 mg | Freq: Once | INTRAMUSCULAR | Status: DC

## 2021-04-07 MED ORDER — PROPOFOL 10 MG/ML IV BOLUS
INTRAVENOUS | Status: DC | PRN
Start: 2021-04-07 — End: 2021-04-07
  Administered 2021-04-07: 50 mg via INTRAVENOUS

## 2021-04-07 MED ORDER — TRANEXAMIC ACID-NACL 1000-0.7 MG/100ML-% IV SOLN
1000.0000 mg | INTRAVENOUS | Status: AC
  Administered 2021-04-07: 1000 mg via INTRAVENOUS
  Filled 2021-04-07: qty 100

## 2021-04-07 MED ORDER — BUPIVACAINE LIPOSOME 1.3 % IJ SUSP: 20.0000 mL | Freq: Once | INTRAMUSCULAR | Status: DC

## 2021-04-07 MED ORDER — CYCLOBENZAPRINE HCL 10 MG PO TABS
10.0000 mg | ORAL_TABLET | Freq: Three times a day (TID) | ORAL | Status: DC | PRN
  Administered 2021-04-07 – 2021-04-08 (×2): 10 mg via ORAL
  Filled 2021-04-07 (×2): qty 1

## 2021-04-07 MED ORDER — ACETAMINOPHEN 10 MG/ML IV SOLN
1000.0000 mg | Freq: Four times a day (QID) | INTRAVENOUS | Status: DC
  Administered 2021-04-07: 1000 mg via INTRAVENOUS
  Filled 2021-04-07: qty 100

## 2021-04-07 MED ORDER — MIDAZOLAM HCL 2 MG/2ML IJ SOLN
INTRAMUSCULAR | Status: AC
  Filled 2021-04-07: qty 2

## 2021-04-07 MED ORDER — FENTANYL CITRATE (PF) 100 MCG/2ML IJ SOLN
INTRAMUSCULAR | Status: AC
  Filled 2021-04-07: qty 2

## 2021-04-07 MED ORDER — 0.9 % SODIUM CHLORIDE (POUR BTL) OPTIME
TOPICAL | Status: DC | PRN
  Administered 2021-04-07: 1000 mL

## 2021-04-07 MED ORDER — ONDANSETRON HCL 4 MG PO TABS: 4.0000 mg | ORAL_TABLET | Freq: Four times a day (QID) | ORAL | Status: DC | PRN

## 2021-04-07 MED ORDER — CHLORHEXIDINE GLUCONATE 0.12 % MT SOLN: 15.0000 mL | Freq: Once | OROMUCOSAL | Status: AC

## 2021-04-07 MED ORDER — CEFAZOLIN SODIUM-DEXTROSE 2-4 GM/100ML-% IV SOLN
2.0000 g | Freq: Four times a day (QID) | INTRAVENOUS | Status: AC
  Administered 2021-04-07 – 2021-04-08 (×2): 2 g via INTRAVENOUS
  Filled 2021-04-07 (×2): qty 100

## 2021-04-07 MED ORDER — ASPIRIN EC 325 MG PO TBEC
325.0000 mg | DELAYED_RELEASE_TABLET | Freq: Two times a day (BID) | ORAL | Status: DC
  Administered 2021-04-08: 325 mg via ORAL
  Filled 2021-04-07: qty 1

## 2021-04-07 MED ORDER — OXYCODONE HCL 5 MG PO TABS
5.0000 mg | ORAL_TABLET | ORAL | Status: DC | PRN
  Administered 2021-04-08: 10 mg via ORAL
  Filled 2021-04-07: qty 2

## 2021-04-07 MED ORDER — BISACODYL 10 MG RE SUPP: 10.0000 mg | Freq: Every day | RECTAL | Status: DC | PRN

## 2021-04-07 MED ORDER — PHENYLEPHRINE HCL-NACL 20-0.9 MG/250ML-% IV SOLN
INTRAVENOUS | Status: DC | PRN
  Administered 2021-04-07: 25 ug/min via INTRAVENOUS

## 2021-04-07 MED ORDER — PROMETHAZINE HCL 25 MG/ML IJ SOLN: 6.2500 mg | INTRAMUSCULAR | Status: DC | PRN

## 2021-04-07 MED ORDER — FLEET ENEMA 7-19 GM/118ML RE ENEM: 1.0000 | ENEMA | Freq: Once | RECTAL | Status: DC | PRN

## 2021-04-07 MED ORDER — PROPOFOL 10 MG/ML IV BOLUS
INTRAVENOUS | Status: AC
  Filled 2021-04-07: qty 20

## 2021-04-07 MED ORDER — HYDROMORPHONE HCL 1 MG/ML IJ SOLN: 0.2500 mg | INTRAMUSCULAR | Status: DC | PRN

## 2021-04-07 MED ORDER — SODIUM CHLORIDE 0.9 % IV SOLN: INTRAVENOUS | Status: DC

## 2021-04-07 MED ORDER — OXYCODONE HCL 5 MG PO TABS
10.0000 mg | ORAL_TABLET | ORAL | Status: DC | PRN
  Administered 2021-04-07 – 2021-04-08 (×3): 15 mg via ORAL
  Filled 2021-04-07 (×3): qty 3

## 2021-04-07 MED ORDER — MORPHINE SULFATE (PF) 2 MG/ML IV SOLN: 0.5000 mg | INTRAVENOUS | Status: DC | PRN

## 2021-04-07 MED ORDER — OXYCODONE HCL 5 MG/5ML PO SOLN: 5.0000 mg | Freq: Once | ORAL | Status: DC | PRN

## 2021-04-07 MED ORDER — POLYETHYLENE GLYCOL 3350 17 G PO PACK: 17.0000 g | PACK | Freq: Every day | ORAL | Status: DC | PRN

## 2021-04-07 MED ORDER — METOCLOPRAMIDE HCL 5 MG/ML IJ SOLN: 5.0000 mg | Freq: Three times a day (TID) | INTRAMUSCULAR | Status: DC | PRN

## 2021-04-07 MED ORDER — DEXAMETHASONE SODIUM PHOSPHATE 10 MG/ML IJ SOLN
INTRAMUSCULAR | Status: DC | PRN
  Administered 2021-04-07: 10 mg via INTRAVENOUS

## 2021-04-07 MED ORDER — ROSUVASTATIN CALCIUM 20 MG PO TABS
40.0000 mg | ORAL_TABLET | Freq: Every day | ORAL | Status: DC
  Administered 2021-04-08: 40 mg via ORAL
  Filled 2021-04-07: qty 2

## 2021-04-07 MED ORDER — ORAL CARE MOUTH RINSE
15.0000 mL | Freq: Once | OROMUCOSAL | Status: AC
  Administered 2021-04-07: 15 mL via OROMUCOSAL

## 2021-04-07 MED ORDER — PROPOFOL 500 MG/50ML IV EMUL
INTRAVENOUS | Status: DC | PRN
  Administered 2021-04-07: 125 ug/kg/min via INTRAVENOUS

## 2021-04-07 MED ORDER — ONDANSETRON HCL 4 MG/2ML IJ SOLN
INTRAMUSCULAR | Status: AC
  Filled 2021-04-07: qty 2

## 2021-04-07 MED ORDER — POVIDONE-IODINE 10 % EX SWAB
2.0000 "application " | Freq: Once | CUTANEOUS | Status: AC
  Administered 2021-04-07: 2 via TOPICAL

## 2021-04-07 MED ORDER — DEXAMETHASONE SODIUM PHOSPHATE 10 MG/ML IJ SOLN
10.0000 mg | Freq: Once | INTRAMUSCULAR | Status: AC
  Administered 2021-04-08: 10 mg via INTRAVENOUS
  Filled 2021-04-07: qty 1

## 2021-04-07 MED ORDER — ONDANSETRON HCL 4 MG/2ML IJ SOLN: 4.0000 mg | Freq: Four times a day (QID) | INTRAMUSCULAR | Status: DC | PRN

## 2021-04-07 MED ORDER — STERILE WATER FOR IRRIGATION IR SOLN
Status: DC | PRN
  Administered 2021-04-07 (×2): 1000 mL

## 2021-04-07 MED ORDER — VANCOMYCIN HCL IN DEXTROSE 1-5 GM/200ML-% IV SOLN
1000.0000 mg | INTRAVENOUS | Status: AC
  Administered 2021-04-07: 1000 mg via INTRAVENOUS
  Filled 2021-04-07: qty 200

## 2021-04-07 MED ORDER — MIDAZOLAM HCL 2 MG/2ML IJ SOLN
1.0000 mg | INTRAMUSCULAR | Status: AC
  Administered 2021-04-07: 2 mg via INTRAVENOUS
  Filled 2021-04-07: qty 2

## 2021-04-07 MED ORDER — METOCLOPRAMIDE HCL 5 MG PO TABS: 5.0000 mg | ORAL_TABLET | Freq: Three times a day (TID) | ORAL | Status: DC | PRN

## 2021-04-07 MED ORDER — ACETAMINOPHEN 500 MG PO TABS
1000.0000 mg | ORAL_TABLET | Freq: Four times a day (QID) | ORAL | Status: DC
  Administered 2021-04-07 – 2021-04-08 (×3): 1000 mg via ORAL
  Filled 2021-04-07 (×3): qty 2

## 2021-04-07 MED ORDER — ONDANSETRON HCL 4 MG/2ML IJ SOLN
INTRAMUSCULAR | Status: DC | PRN
  Administered 2021-04-07: 4 mg via INTRAVENOUS

## 2021-04-07 SURGICAL SUPPLY — 67 items
AUGMENT DISTAL FEM SZ6 4 KNEE (Miscellaneous) ×4 IMPLANT
AUGMENT POST FEM SZ6 4 KNEE (Miscellaneous) ×4 IMPLANT
BAG COUNTER SPONGE SURGICOUNT (BAG) IMPLANT
BAG DECANTER FOR FLEXI CONT (MISCELLANEOUS) ×2 IMPLANT
BAG ZIPLOCK 12X15 (MISCELLANEOUS) IMPLANT
BASE TIB KNEE REV RP ATUNE SZ6 (Knees) ×2 IMPLANT
BLADE SAG 18X100X1.27 (BLADE) ×2 IMPLANT
BLADE SAW SGTL 11.0X1.19X90.0M (BLADE) ×2 IMPLANT
BLADE SURG SZ10 CARB STEEL (BLADE) ×4 IMPLANT
BNDG ELASTIC 6X5.8 VLCR STR LF (GAUZE/BANDAGES/DRESSINGS) ×2 IMPLANT
BONE CEMENT GENTAMICIN (Cement) ×4 IMPLANT
CEMENT BONE GENTAMICIN 40 (Cement) ×2 IMPLANT
CLOTH BEACON ORANGE TIMEOUT ST (SAFETY) ×2 IMPLANT
CNTNR URN SCR LID CUP LEK RST (MISCELLANEOUS) ×2 IMPLANT
CONT SPEC 4OZ STRL OR WHT (MISCELLANEOUS) ×2
COVER SURGICAL LIGHT HANDLE (MISCELLANEOUS) ×2 IMPLANT
CUFF TOURN SGL QUICK 34 (TOURNIQUET CUFF) ×1
CUFF TRNQT CYL 34X4.125X (TOURNIQUET CUFF) ×1 IMPLANT
DECANTER SPIKE VIAL GLASS SM (MISCELLANEOUS) IMPLANT
DRAPE INCISE IOBAN 66X45 STRL (DRAPES) ×6 IMPLANT
DRAPE U-SHAPE 47X51 STRL (DRAPES) ×2 IMPLANT
DRSG ADAPTIC 3X8 NADH LF (GAUZE/BANDAGES/DRESSINGS) IMPLANT
DRSG AQUACEL AG ADV 3.5X10 (GAUZE/BANDAGES/DRESSINGS) ×2 IMPLANT
DRSG PAD ABDOMINAL 8X10 ST (GAUZE/BANDAGES/DRESSINGS) IMPLANT
DURAPREP 26ML APPLICATOR (WOUND CARE) ×2 IMPLANT
ELECT REM PT RETURN 15FT ADLT (MISCELLANEOUS) ×2 IMPLANT
EVACUATOR 1/8 PVC DRAIN (DRAIN) ×2 IMPLANT
FEM KNEE ATTUNE REV CRS SZ6 RT (Orthopedic Implant) ×2 IMPLANT
FEMORAL KNE ATTN REV CRS SZ6RT (Orthopedic Implant) ×1 IMPLANT
GAUZE SPONGE 4X4 12PLY STRL (GAUZE/BANDAGES/DRESSINGS) IMPLANT
GLOVE SRG 8 PF TXTR STRL LF DI (GLOVE) ×1 IMPLANT
GLOVE SURG ENC MOIS LTX SZ6.5 (GLOVE) ×4 IMPLANT
GLOVE SURG ENC MOIS LTX SZ8 (GLOVE) ×4 IMPLANT
GLOVE SURG UNDER POLY LF SZ7 (GLOVE) ×2 IMPLANT
GLOVE SURG UNDER POLY LF SZ8 (GLOVE) ×1
GLOVE SURG UNDER POLY LF SZ8.5 (GLOVE) IMPLANT
GOWN STRL REUS W/TWL LRG LVL3 (GOWN DISPOSABLE) ×4 IMPLANT
HANDPIECE INTERPULSE COAX TIP (DISPOSABLE) ×1
HOLDER FOLEY CATH W/STRAP (MISCELLANEOUS) ×2 IMPLANT
IMMOBILIZER KNEE 20 (SOFTGOODS) ×2
IMMOBILIZER KNEE 20 THIGH 36 (SOFTGOODS) ×1 IMPLANT
INSERT TIB CRS ATTUNE SZ6 16 (Insert) ×2 IMPLANT
KIT TURNOVER KIT A (KITS) ×2 IMPLANT
MANIFOLD NEPTUNE II (INSTRUMENTS) ×2 IMPLANT
NS IRRIG 1000ML POUR BTL (IV SOLUTION) ×2 IMPLANT
PACK TOTAL KNEE CUSTOM (KITS) ×2 IMPLANT
PADDING CAST COTTON 6X4 STRL (CAST SUPPLIES) ×2 IMPLANT
PROTECTOR NERVE ULNAR (MISCELLANEOUS) ×2 IMPLANT
SET HNDPC FAN SPRY TIP SCT (DISPOSABLE) ×1 IMPLANT
SLEEVE KNEE ATTUNE 29MM (Knees) ×2 IMPLANT
STEM STR ATTUNE PF 14X60 (Knees) ×2 IMPLANT
STEM STR ATTUNE PF 16X110 (Knees) ×2 IMPLANT
STRIP CLOSURE SKIN 1/2X4 (GAUZE/BANDAGES/DRESSINGS) ×4 IMPLANT
SUT MNCRL AB 4-0 PS2 18 (SUTURE) ×2 IMPLANT
SUT STRATAFIX 0 PDS 27 VIOLET (SUTURE) ×2
SUT VIC AB 2-0 CT1 27 (SUTURE) ×3
SUT VIC AB 2-0 CT1 TAPERPNT 27 (SUTURE) ×3 IMPLANT
SUTURE STRATFX 0 PDS 27 VIOLET (SUTURE) ×1 IMPLANT
SWAB COLLECTION DEVICE MRSA (MISCELLANEOUS) ×2 IMPLANT
SWAB CULTURE ESWAB REG 1ML (MISCELLANEOUS) ×2 IMPLANT
SYR 50ML LL SCALE MARK (SYRINGE) ×4 IMPLANT
TOWER CARTRIDGE SMART MIX (DISPOSABLE) ×2 IMPLANT
TRAY FOLEY MTR SLVR 16FR STAT (SET/KITS/TRAYS/PACK) ×2 IMPLANT
TUBE KAMVAC SUCTION (TUBING) IMPLANT
TUBE SUCTION HIGH CAP CLEAR NV (SUCTIONS) ×2 IMPLANT
WATER STERILE IRR 1000ML POUR (IV SOLUTION) ×2 IMPLANT
WRAP KNEE MAXI GEL POST OP (GAUZE/BANDAGES/DRESSINGS) ×2 IMPLANT

## 2021-04-07 NOTE — Op Note (Signed)
NAME: Anthony Reese, Anthony Reese MEDICAL RECORD NO: [id number] ACCOUNT NO: [id number] DATE OF BIRTH: February 15, 1971 FACILITY: [facility] LOCATION: [id number] PHYSICIAN: Gus Rankin. Jenner Rosier, MD  Operative Report   DATE OF PROCEDURE: 04/07/2021  PREOPERATIVE DIAGNOSIS:  Failed unstable right total knee arthroplasty.  POSTOPERATIVE DIAGNOSIS:  Failed unstable right total knee arthroplasty.  PROCEDURE:  Right total knee arthroplasty revision.  SURGEON:  Gus Rankin. Lolamae Voisin, MD  ASSISTANT:  Nelia Shi PA-C  ANESTHESIA:  Adductor canal block and spinal.  ESTIMATED BLOOD LOSS:  50.  DRAIN:  None.  TOURNIQUET TIME: Up 59 minutes at 300 mmHg, down 8 minutes, up additional 19 minutes at 300 mmHg.  COMPLICATIONS:  None.  CONDITION:  Stable to recovery.  BRIEF CLINICAL NOTE:  The patient is a 50 year old male, had right total knee arthroplasty done a few years ago and has had persistent pain and swelling.  Has had exam and history consistent with an unstable total knee.  Bone scan was positive for  loosening versus infection.  Lab work was equivocal with normal sed rate, barely elevated C-reactive protein, and normal cultures of the fluid.  He presents now for right knee polyethylene revision versus total knee revision versus resection  arthroplasty.  DESCRIPTION OF PROCEDURE:  After successful administration of adductor canal block and spinal, tourniquet was placed high on the right thigh.  Right lower extremity was prepped and draped in the usual sterile fashion.  Exam under anesthesia showed a  grossly unstable knee in extension to varus and valgus laxity as well as in flexion to AP laxity.  The extremity was wrapped in Esmarch, knee flexed, and tourniquet inflated to 300 mmHg.  Midline incision was made with a 10 blade through subcutaneous  tissue to the extensor mechanism.  A fresh blade was used to make a medial parapatellar arthrotomy.  There was a considerable amount of synovial fluid present.  It was sent for  stat Gram stain, which was negative.  I also sent synovial tissue for a  frozen section, which showed no acute inflammation, and for Gram stain, culture and sensitivity, which also showed no evidence of infection.  Soft tissue over the proximal medial tibia was then subperiosteally elevated to the joint line with a knife and  into the semimembranosus bursa with a Cobb elevator.  Soft tissue laterally was elevated with attention being paid to avoiding the patellar tendon on the tibial tubercle.  I was able to evert the patella and the knee was flexed to 90 degrees.  Tibial  polyethylene was removed.  X-ray was concerning for possible loosening of the tibial component.  I was able to place a Cobb elevator under the front edge of the component and I felt it was loose.  Circumferential retractors were then placed around the  tibia.  An oscillating saw was used to disrupt the interface between the tibial component and bone.  Components were easily removed.  I then removed this cement from the proximal tibial canal.  The extramedullary tibial alignment guide was placed for  referencing proximally at the medial aspect of the tibial tubercle and distally along the second metatarsal axis and tibial crest.  A block was pinned to cut just below the previously cut surface.  Resection was made with an oscillating saw to healthy  appearing bone.  We then reamed the canal up to a 14 for placement of a 14 mm press-fit stem, used a 14 x 60.  The proximal tibia was then prepared for the size 6 MBT revision tray.  We reamed proximally.  I then did sleeve and broach for a 29 sleeve,  which had excellent torsional and rotational stability.  Tibial preparation was then completed.  We then disrupted the interface between the femoral component and bone using osteotomes.  Femoral components were removed with essentially no bone loss.  We accessed the femoral canal and thoroughly irrigated it.  I reamed up to 16 mm for a 16 mm   press-fit stem.  This was going to be a 16 x 110 stem.  The reamer was left in to serve as our intramedullary alignment guide.  Distal femoral cutting block was placed for a 5-degree valgus cut.  I removed about 2 mm off both the medial and lateral  surfaces.  Size 6 was most appropriate femoral component.  The size 6 cutting block with a 16 x 110 stem was placed.  It was impacted and rotation was subsequently marked off the epicondylar axis and confirmed by creating a rectangular flexion gap at 90  degrees with spacer blocks.  The block was pinned.  We did not get any bony anteriorly.  Posteriorly had to go in to +4 position medial and lateral to get any bone.  Thus, we needed 4 mm augments medial and lateral posteriorly.  The intercondylar block  was placed and that intercondylar cut made for the constrained polyethylene.  The cutting blocks were removed.  Trials were then placed on the tibial side, size 6 MBT revision tray with a 14 x 60 stem extension, which is press-fit stem and a 29 sleeve.   This had excellent purchase and excellent fit.  On the femoral side, a size 6 Attune revision femur with a 16 x 110 stem extension and 5 degrees of valgus and +2 position.  It required 4 mm augments posteriorly and then after checking the balance it also  required 4 mm augments distally to get an equal flexion and extension gap.  A 16 mm insert was most appropriate, and a 16 mm trial insert was placed.  Full extension achieved with excellent varus and valgus balance throughout full range of motion.  The  knee was then held in full extension and the tourniquet was deflated and left down for 8 minutes.  While the tourniquet was down for 8 minutes.  Minor bleeding stopped with electrocautery.  Simultaneously, the components were assembled on the back table.   After 8 minutes, the legs were wrapped in Esmarch and tourniquet reinflated to 300 mmHg.  The trials were removed, and the cut bone surfaces prepared with  pulsatile lavage.  Two batches of gentamicin-impregnated cement were mixed, and once ready for  implantation, we cemented the tibial tray proximally, but the sleeve was porous coated, and it was a press-fit 14 x 60 stem.  On the femoral side, we just cemented distally, and the stem was press fit while we cemented distally.  The femur was impacted.   All extruded cement was removed.  A 60 mm trial insert was placed.  Knee held in full extension.  All extruded cement was removed.  While the cement was hardening, I did a patellaplasty removing all the overhanging soft tissue from the edges of the  patella.  Once cement was fully hardened, the permanent 16 mm constrained rotating platform insert for the revision Attune femur was placed.  Full extension was achieved with excellent varus, valgus, and anterior, posterior balance throughout full range  of motion.  Trial was then removed, and the permanent 16 mm  posterior stabilized rotating platform insert was placed.  Knee was reduced with the same stability parameters.  Wound was copiously irrigated with saline solution, and the arthrotomy was closed  with running 0 Stratafix suture.  Tourniquet was then released for the second time of 19 minutes.  Subcutaneous was closed with interrupted 2-0 Vicryl, subcuticular running 4-0 Monocryl.  Incision was cleaned and dried, and Steri-Strips and a sterile  dressing applied.  He was placed into a knee immobilizer, awakened, and transported to recovery in stable condition.  Note that a surgical assistant was of medical necessity for this procedure to do in a safe and expeditious manner.  Surgical assistant was necessary for retraction of vital ligaments and neurovascular structures and for proper positioning of the limb for  removal of the old implant and for safe and accurate placement of the new implant.   ROH D: 04/07/2021 1:17:39 pm T: 04/07/2021 6:29:00 pm  JOB: [id number]/ [id number]

## 2021-04-07 NOTE — Anesthesia Preprocedure Evaluation (Signed)
Anesthesia Evaluation  Patient identified by MRN, date of birth, ID band Patient awake    Reviewed: Allergy & Precautions, NPO status , Patient's Chart, lab work & pertinent test results  History of Anesthesia Complications Negative for: history of anesthetic complications  Airway Mallampati: II  TM Distance: >3 FB Neck ROM: Full    Dental  (+) Teeth Intact, Dental Advisory Given   Pulmonary neg shortness of breath, neg sleep apnea, neg COPD, neg recent URI, former smoker,    Pulmonary exam normal breath sounds clear to auscultation       Cardiovascular Exercise Tolerance: Good (-) hypertension(-) angina(-) Past MI, (-) Cardiac Stents, (-) CABG, (-) Orthopnea and (-) PND negative cardio ROS   Rhythm:Regular Rate:Normal     Neuro/Psych  Headaches, neg Seizures  Neuromuscular disease (radiculopathy of upper extremity)    GI/Hepatic negative GI ROS, Neg liver ROS, neg GERD  ,  Endo/Other  neg diabetesLow testosterone  Renal/GU negative Renal ROS     Musculoskeletal  (+) Arthritis , Osteoarthritis,    Abdominal (+) + obese,   Peds  Hematology negative hematology ROS (+)   Anesthesia Other Findings HLD  Reproductive/Obstetrics                             Anesthesia Physical  Anesthesia Plan  ASA: II  Anesthesia Plan: Spinal and Regional   Post-op Pain Management:  Regional for Post-op pain   Induction: Intravenous  PONV Risk Score and Plan: 0 and Treatment may vary due to age or medical condition  Airway Management Planned: Natural Airway and Simple Face Mask  Additional Equipment:   Intra-op Plan:   Post-operative Plan:   Informed Consent: I have reviewed the patients History and Physical, chart, labs and discussed the procedure including the risks, benefits and alternatives for the proposed anesthesia with the patient or authorized representative who has indicated his/her  understanding and acceptance.     Dental advisory given  Plan Discussed with:   Anesthesia Plan Comments: (I have discussed risks of neuraxial anesthesia including but not limited to infection, bleeding, nerve injury, back pain, headache, seizures, and failure of block. Patient denies bleeding disorders and is not currently anticoagulated. Labs have been reviewed. Risks and benefits discussed. All patient's questions answered.   )        Anesthesia Quick Evaluation

## 2021-04-07 NOTE — Discharge Instructions (Signed)
 Frank Aluisio, MD Total Joint Specialist [facility] [address] [place], [place] [address] [phone]  TOTAL KNEE REVISION POSTOPERATIVE DIRECTIONS    Knee Rehabilitation, Guidelines Following Surgery  Results after knee surgery are often greatly improved when you follow the exercise, range of motion and muscle strengthening exercises prescribed by your doctor. Safety measures are also important to protect the knee from further injury. If any of these exercises cause you to have increased pain or swelling in your knee joint, decrease the amount until you are comfortable again and slowly increase them. If you have problems or questions, call your caregiver or physical therapist for advice.   BLOOD CLOT PREVENTION Take a 325 mg Aspirin two times a day for three weeks following surgery. Then take an 81 mg Aspirin once a day for three weeks. Then discontinue Aspirin. You may resume your vitamins/supplements upon discharge from the hospital. Do not take any NSAIDs (Advil, Aleve, Ibuprofen, Meloxicam, etc.) until you have discontinued the 325 mg Aspirin.   HOME CARE INSTRUCTIONS  Remove items at home which could result in a fall. This includes throw rugs or furniture in walking pathways.  ICE to the affected knee as much as tolerated. Icing helps control swelling. If the swelling is well controlled you will be more comfortable and rehab easier. Continue to use ice on the knee for pain and swelling from surgery. You may notice swelling that will progress down to the foot and ankle. This is normal after surgery. Elevate the leg when you are not up walking on it.    Continue to use the breathing machine which will help keep your temperature down. It is common for your temperature to cycle up and down following surgery, especially at night when you are not up moving around and exerting yourself. The breathing machine keeps your lungs expanded and your temperature  down. Do not place pillow under the operative knee, focus on keeping the knee straight while resting  DIET You may resume your previous home diet once you are discharged from the hospital.  DRESSING / WOUND CARE / SHOWERING Keep your bulky bandage on for 2 days. On the third post-operative day you may remove the Ace bandage and gauze. There is a waterproof adhesive bandage on your skin which will stay in place until your first follow-up appointment. Once you remove this you will not need to place another bandage You may begin showering 3 days following surgery, but do not submerge the incision under water.  ACTIVITY For the first 5 days, the key is rest and control of pain and swelling Do your home exercises twice a day starting on post-operative day 3. On the days you go to physical therapy, just do the home exercises once that day. You should rest, ice and elevate the leg for 50 minutes out of every hour. Get up and walk/stretch for 10 minutes per hour. After 5 days you can increase your activity slowly as tolerated. Walk with your walker as instructed. Use the walker until you are comfortable transitioning to a cane. Walk with the cane in the opposite hand of the operative leg. You may discontinue the cane once you are comfortable and walking steadily. Avoid periods of inactivity such as sitting longer than an hour when not asleep. This helps prevent blood clots.  You may discontinue the knee immobilizer once you are able to perform a straight leg raise while lying down. You may resume a sexual relationship in one   month or when given the OK by your doctor.  You may return to work once you are cleared by your doctor.  Do not drive a car for 6 weeks or until released by your surgeon.  Do not drive while taking narcotics.  TED HOSE STOCKINGS Wear the elastic stockings on both legs for three weeks following surgery during the day. You may remove them at night for sleeping.  WEIGHT  BEARING Weight bearing as tolerated with assist device (walker, cane, etc) as directed, use it as long as suggested by your surgeon or therapist, typically at least 4-6 weeks.  POSTOPERATIVE CONSTIPATION PROTOCOL Constipation - defined medically as fewer than three stools per week and severe constipation as less than one stool per week.  One of the most common issues patients have following surgery is constipation.  Even if you have a regular bowel pattern at home, your normal regimen is likely to be disrupted due to multiple reasons following surgery.  Combination of anesthesia, postoperative narcotics, change in appetite and fluid intake all can affect your bowels.  In order to avoid complications following surgery, here are some recommendations in order to help you during your recovery period.  Colace (docusate) - Pick up an over-the-counter form of Colace or another stool softener and take twice a day as long as you are requiring postoperative pain medications.  Take with a full glass of water daily.  If you experience loose stools or diarrhea, hold the colace until you stool forms back up. If your symptoms do not get better within 1 week or if they get worse, check with your doctor. Dulcolax (bisacodyl) - Pick up over-the-counter and take as directed by the product packaging as needed to assist with the movement of your bowels.  Take with a full glass of water.  Use this product as needed if not relieved by Colace only.  MiraLax (polyethylene glycol) - Pick up over-the-counter to have on hand. MiraLax is a solution that will increase the amount of water in your bowels to assist with bowel movements.  Take as directed and can mix with a glass of water, juice, soda, coffee, or tea. Take if you go more than two days without a movement. Do not use MiraLax more than once per day. Call your doctor if you are still constipated or irregular after using this medication for 7 days in a row.  If you continue  to have problems with postoperative constipation, please contact the office for further assistance and recommendations.  If you experience "the worst abdominal pain ever" or develop nausea or vomiting, please contact the office immediatly for further recommendations for treatment.  ITCHING If you experience itching with your medications, try taking only a single pain pill, or even half a pain pill at a time.  You can also use Benadryl over the counter for itching or also to help with sleep.   MEDICATIONS See your medication summary on the "After Visit Summary" that the nursing staff will review with you prior to discharge.  You may have some home medications which will be placed on hold until you complete the course of blood thinner medication.  It is important for you to complete the blood thinner medication as prescribed by your surgeon.  Continue your approved medications as instructed at time of discharge.  PRECAUTIONS If you experience chest pain or shortness of breath - call 911 immediately for transfer to the hospital emergency department.  If you develop a fever greater that 101   F, purulent drainage from wound, increased redness or drainage from wound, foul odor from the wound/dressing, or calf pain - CONTACT YOUR SURGEON.                                                   FOLLOW-UP APPOINTMENTS Make sure you keep all of your appointments after your operation with your surgeon and caregivers. You should call the office at the above phone number and make an appointment for approximately two weeks after the date of your surgery or on the date instructed by your surgeon outlined in the "After Visit Summary".  RANGE OF MOTION AND STRENGTHENING EXERCISES  Rehabilitation of the knee is important following a knee injury or an operation. After just a few days of immobilization, the muscles of the thigh which control the knee become weakened and shrink (atrophy). Knee exercises are designed to build up  the tone and strength of the thigh muscles and to improve knee motion. Often times heat used for twenty to thirty minutes before working out will loosen up your tissues and help with improving the range of motion but do not use heat for the first two weeks following surgery. These exercises can be done on a training (exercise) mat, on the floor, on a table or on a bed. Use what ever works the best and is most comfortable for you Knee exercises include:  Leg Lifts - While your knee is still immobilized in a splint or cast, you can do straight leg raises. Lift the leg to 60 degrees, hold for 3 sec, and slowly lower the leg. Repeat 10-20 times 2-3 times daily. Perform this exercise against resistance later as your knee gets better.  Quad and Hamstring Sets - Tighten up the muscle on the front of the thigh (Quad) and hold for 5-10 sec. Repeat this 10-20 times hourly. Hamstring sets are done by pushing the foot backward against an object and holding for 5-10 sec. Repeat as with quad sets.  Leg Slides: Lying on your back, slowly slide your foot toward your buttocks, bending your knee up off the floor (only go as far as is comfortable). Then slowly slide your foot back down until your leg is flat on the floor again. Angel Wings: Lying on your back spread your legs to the side as far apart as you can without causing discomfort.  A rehabilitation program following serious knee injuries can speed recovery and prevent re-injury in the future due to weakened muscles. Contact your doctor or a physical therapist for more information on knee rehabilitation.   POST-OPERATIVE OPIOID TAPER INSTRUCTIONS: It is important to wean off of your opioid medication as soon as possible. If you do not need pain medication after your surgery it is ok to stop day one. Opioids include: Codeine, Hydrocodone(Norco, Vicodin), Oxycodone(Percocet, oxycontin) and hydromorphone amongst others.  Long term and even short term use of opiods can  cause: Increased pain response Dependence Constipation Depression Respiratory depression And more.  Withdrawal symptoms can include Flu like symptoms Nausea, vomiting And more Techniques to manage these symptoms Hydrate well Eat regular healthy meals Stay active Use relaxation techniques(deep breathing, meditating, yoga) Do Not substitute Alcohol to help with tapering If you have been on opioids for less than two weeks and do not have pain than it is ok to stop all together.    Plan to wean off of opioids This plan should start within one week post op of your joint replacement. Maintain the same interval or time between taking each dose and first decrease the dose.  Cut the total daily intake of opioids by one tablet each day Next start to increase the time between doses. The last dose that should be eliminated is the evening dose.   IF YOU ARE TRANSFERRED TO A SKILLED REHAB FACILITY If the patient is transferred to a skilled rehab facility following release from the hospital, a list of the current medications will be sent to the facility for the patient to continue.  When discharged from the skilled rehab facility, please have the facility set up the patient's Home Health Physical Therapy prior to being released. Also, the skilled facility will be responsible for providing the patient with their medications at time of release from the facility to include their pain medication, the muscle relaxants, and their blood thinner medication. If the patient is still at the rehab facility at time of the two week follow up appointment, the skilled rehab facility will also need to assist the patient in arranging follow up appointment in our office and any transportation needs.  MAKE SURE YOU:  Understand these instructions.  Get help right away if you are not doing well or get worse.   DENTAL ANTIBIOTICS:  In most cases prophylactic antibiotics for Dental procdeures after total joint surgery are  not necessary.  Exceptions are as follows:  1. History of prior total joint infection  2. Severely immunocompromised (Organ Transplant, cancer chemotherapy, Rheumatoid biologic meds such as Humera)  3. Poorly controlled diabetes (A1C &gt; 8.0, blood glucose over 200)  If you have one of these conditions, contact your surgeon for an antibiotic prescription, prior to your dental procedure.    Pick up stool softner and laxative for home use following surgery while on pain medications. Do not submerge incision under water. Please use good hand washing techniques while changing dressing each day. May shower starting three days after surgery. Please use a clean towel to pat the incision dry following showers. Continue to use ice for pain and swelling after surgery. Do not use any lotions or creams on the incision until instructed by your surgeon.  

## 2021-04-07 NOTE — Anesthesia Procedure Notes (Signed)
Anesthesia Regional Block: Adductor canal block   Pre-Anesthetic Checklist: , timeout performed,  Correct Patient, Correct Site, Correct Laterality,  Correct Procedure, Correct Position, site marked,  Risks and benefits discussed,  Surgical consent,  Pre-op evaluation,  At surgeon's request and post-op pain management  Laterality: Right  Prep: chloraprep       Needles:  Injection technique: Single-shot  Needle Type: Stimiplex     Needle Length: 9cm  Needle Gauge: 21     Additional Needles:   Procedures:,,,, ultrasound used (permanent image in chart),,    Narrative:  Start time: 04/07/2021 10:03 AM End time: 04/07/2021 10:08 AM Injection made incrementally with aspirations every 5 mL.  Performed by: Personally  Anesthesiologist: Lynda Rainwater, MD

## 2021-04-07 NOTE — Anesthesia Postprocedure Evaluation (Signed)
Anesthesia Post Note  Patient: Dewain Platz  Procedure(s) Performed: RIGHT TOTAL KNEE REVISION (Right: Knee)     Patient location during evaluation: PACU Anesthesia Type: Regional and Spinal Level of consciousness: awake and alert Pain management: pain level controlled Vital Signs Assessment: post-procedure vital signs reviewed and stable Respiratory status: spontaneous breathing, nonlabored ventilation and respiratory function stable Cardiovascular status: blood pressure returned to baseline and stable Postop Assessment: no apparent nausea or vomiting Anesthetic complications: no   No notable events documented.  Last Vitals:  Vitals:   04/07/21 1415 04/07/21 1430  BP: 136/89 131/90  Pulse: 86 84  Resp: 16 11  Temp:    SpO2: 98% 100%    Last Pain:  Vitals:   04/07/21 1430  TempSrc:   PainSc: 2                  Lynda Rainwater

## 2021-04-07 NOTE — Plan of Care (Signed)
  Problem: Education: Goal: Knowledge of the prescribed therapeutic regimen will improve Outcome: Progressing   Problem: Pain Management: Goal: Pain level will decrease with appropriate interventions Outcome: Progressing   

## 2021-04-07 NOTE — Progress Notes (Signed)
Orthopedic Tech Progress Note Patient Details:  Anthony Reese 04-23-1971 [id number]  Patient ID: Anthony Reese, male   DOB: 1970/09/04, 50 y.o.   MRN: [id number]  Anthony Reese 04/07/2021, 2:06 PM Cpm applied to right leg in pacu

## 2021-04-07 NOTE — Progress Notes (Signed)
AssistedDr. Miller with right, ultrasound guided, adductor canal block. Side rails up, monitors on throughout procedure. See vital signs in flow sheet. Tolerated Procedure well.  

## 2021-04-07 NOTE — Anesthesia Procedure Notes (Signed)
Spinal  Patient location during procedure: OR Start time: 04/07/2021 11:08 AM End time: 04/07/2021 11:13 AM Reason for block: surgical anesthesia Staffing Performed: anesthesiologist  Anesthesiologist: Lynda Rainwater, MD Preanesthetic Checklist Completed: patient identified, IV checked, site marked, risks and benefits discussed, surgical consent, monitors and equipment checked, pre-op evaluation and timeout performed Spinal Block Patient position: sitting Prep: DuraPrep Patient monitoring: heart rate, cardiac monitor, continuous pulse ox and blood pressure Approach: midline Location: L3-4 Injection technique: single-shot Needle Needle type: Quincke  Needle gauge: 22 G Needle length: 9 cm Assessment Sensory level: T4 Events: CSF return and second provider Additional Notes 1st attempt by CRNA unsuccessful with 24 g pencan.  Anesthesiologist successful with 22 g quincke.

## 2021-04-07 NOTE — Progress Notes (Signed)
PT Cancellation Note  Patient Details Name: Anthony Reese MRN: [id number] DOB: 03/25/1971   Cancelled Treatment:    Reason Eval/Treat Not Completed: Attempted PT eval POD0, pt reported still having numbness in bil LEs. Deferred mobility for safety reasons at this time. Explained that nursing can assist with dangling EOB later and PT will return in am.    Doreatha Massed, PT Acute Rehabilitation  Office: [phone] Pager: [phone]

## 2021-04-07 NOTE — Anesthesia Procedure Notes (Signed)
Procedure Name: MAC Date/Time: 04/07/2021 10:56 AM Performed by: Lissa Morales, CRNA Pre-anesthesia Checklist: Patient identified, Emergency Drugs available, Suction available and Patient being monitored Patient Re-evaluated:Patient Re-evaluated prior to induction Oxygen Delivery Method: Simple face mask Placement Confirmation: positive ETCO2

## 2021-04-07 NOTE — Progress Notes (Signed)
Orthopedic Tech Progress Note Patient Details:  Anthony Reese 03/23/1971 [id number]  Patient ID: Anthony Reese, male   DOB: 1970/12/12, 50 y.o.   MRN: [id number]  Anthony Reese 04/07/2021, 7:50 PM Cpm removed

## 2021-04-07 NOTE — Transfer of Care (Addendum)
Immediate Anesthesia Transfer of Care Note  Patient: Anthony Reese  Procedure(s) Performed: RIGHT TOTAL KNEE REVISION (Right: Knee)  Patient Location: PACU  Anesthesia Type:Spinal  Level of Consciousness: awake, alert , oriented and patient cooperative  Airway & Oxygen Therapy: Patient Spontanous Breathing and Patient connected to face mask oxygen  Post-op Assessment: Report given to RN and Post -op Vital signs reviewed and stable  Post vital signs: stable  Last Vitals:  Vitals Value Taken Time  BP 116/86 04/07/21 1338  Temp    Pulse 74 04/07/21 1343  Resp 16 04/07/21 1343  SpO2 100 % 04/07/21 1343  Vitals shown include unvalidated device data.  Last Pain:  Vitals:   04/07/21 0907  TempSrc: Oral         Complications: No notable events documented.

## 2021-04-07 NOTE — Brief Op Note (Signed)
04/07/2021  1:06 PM  PATIENT:  Gary Gabrielsen  50 y.o. male  PRE-OPERATIVE DIAGNOSIS:  unstable right total knee arthroplasty  POST-OPERATIVE DIAGNOSIS:  unstable right total knee arthroplasty  PROCEDURE:  Procedure(s): RIGHT TOTAL KNEE REVISION (Right)  SURGEON:  Surgeon(s) and Role:    Gaynelle Arabian, MD - Primary  PHYSICIAN ASSISTANT:   ASSISTANTS: Fenton Foy, PA-C   ANESTHESIA:    Adductor canal block and spinal  EBL:  50 mL   DRAINS: none   LOCAL MEDICATIONS USED:  NONE  COUNTS:  YES  TOURNIQUET:   Total Tourniquet Time Documented: Thigh (Right) - 59 minutes Thigh (Right) - 19 minutes Total: Thigh (Right) - 78 minutes   DICTATION: .Other Dictation: Dictation Number [id number]  PLAN OF CARE: Admit for overnight observation  PATIENT DISPOSITION:  PACU - hemodynamically stable.

## 2021-04-07 NOTE — Interval H&P Note (Signed)
History and Physical Interval Note:  04/07/2021 8:44 AM  Anthony Reese  has presented today for surgery, with the diagnosis of unstable right total knee arthroplasty.  The various methods of treatment have been discussed with the patient and family. After consideration of risks, benefits and other options for treatment, the patient has consented to  Procedure(s): Right knee polyethylene vs total knee arthroplasty revision (Right) as a surgical intervention.  The patient's history has been reviewed, patient examined, no change in status, stable for surgery.  I have reviewed the patient's chart and labs.  Questions were answered to the patient's satisfaction.     Pilar Plate Conn [place]

## 2021-04-08 ENCOUNTER — Encounter (HOSPITAL_COMMUNITY): Payer: Self-pay | Admitting: Orthopedic Surgery

## 2021-04-08 DIAGNOSIS — T84023A Instability of internal left knee prosthesis, initial encounter: Secondary | ICD-10-CM | POA: Diagnosis not present

## 2021-04-08 LAB — CBC
HCT: 41.7 % (ref 39.0–52.0)
Hemoglobin: 14.9 g/dL (ref 13.0–17.0)
MCH: 34.3 pg — ABNORMAL HIGH (ref 26.0–34.0)
MCHC: 35.7 g/dL (ref 30.0–36.0)
MCV: 95.9 fL (ref 80.0–100.0)
Platelets: 274 10*3/uL (ref 150–400)
RBC: 4.35 MIL/uL (ref 4.22–5.81)
RDW: 12.1 % (ref 11.5–15.5)
WBC: 15.3 10*3/uL — ABNORMAL HIGH (ref 4.0–10.5)
nRBC: 0 % (ref 0.0–0.2)

## 2021-04-08 LAB — BASIC METABOLIC PANEL
Anion gap: 8 (ref 5–15)
BUN: 14 mg/dL (ref 6–20)
CO2: 25 mmol/L (ref 22–32)
Calcium: 8.5 mg/dL — ABNORMAL LOW (ref 8.9–10.3)
Chloride: 100 mmol/L (ref 98–111)
Creatinine, Ser: 0.72 mg/dL (ref 0.61–1.24)
GFR, Estimated: >60 mL/min (ref >60)
Glucose, Bld: 170 mg/dL — ABNORMAL HIGH (ref 70–99)
Potassium: 3.9 mmol/L (ref 3.5–5.1)
Sodium: 133 mmol/L — ABNORMAL LOW (ref 135–145)

## 2021-04-08 LAB — SURGICAL PATHOLOGY

## 2021-04-08 MED ORDER — OXYCODONE HCL 5 MG PO TABS: 5.0000 mg | ORAL_TABLET | Freq: Four times a day (QID) | ORAL | 0 refills | Status: DC | PRN

## 2021-04-08 MED ORDER — ASPIRIN 325 MG PO TBEC
325.0000 mg | DELAYED_RELEASE_TABLET | Freq: Two times a day (BID) | ORAL | 0 refills | Status: DC
Start: 2021-04-08 — End: 2022-07-25

## 2021-04-08 MED ORDER — TRAMADOL HCL 50 MG PO TABS: 50.0000 mg | ORAL_TABLET | Freq: Four times a day (QID) | ORAL | 0 refills | Status: DC | PRN

## 2021-04-08 MED ORDER — CYCLOBENZAPRINE HCL 10 MG PO TABS: 10.0000 mg | ORAL_TABLET | Freq: Three times a day (TID) | ORAL | 0 refills | Status: DC | PRN

## 2021-04-08 NOTE — Plan of Care (Signed)
  Problem: Education: Goal: Knowledge of the prescribed therapeutic regimen will improve Outcome: Adequate for Discharge Goal: Individualized Educational Video(s) Outcome: Adequate for Discharge   Problem: Activity: Goal: Ability to avoid complications of mobility impairment will improve Outcome: Adequate for Discharge Goal: Range of joint motion will improve Outcome: Adequate for Discharge   Problem: Clinical Measurements: Goal: Postoperative complications will be avoided or minimized Outcome: Adequate for Discharge   Problem: Pain Management: Goal: Pain level will decrease with appropriate interventions Outcome: Adequate for Discharge   Problem: Skin Integrity: Goal: Will show signs of wound healing Outcome: Adequate for Discharge  Discharge teaching done. Writen information given.

## 2021-04-08 NOTE — Progress Notes (Signed)
Subjective: 1 Day Post-Op Procedure(s) (LRB): RIGHT TOTAL KNEE REVISION (Right) Patient reports pain as mild.   Patient seen in rounds by Dr. Wynelle Link. Patient is well, and has had no acute complaints or problems. Denies SOB, chest pain, or calf pain. No acute overnight events. Unable to complete PT yesterday due to lingering numbness in bilateral lower extremities. Will begin PT today. Foley pulled this am.    Objective: Vital signs in last 24 hours: Temp:  [97.4 F (36.3 C)-98.4 F (36.9 C)] 97.8 F (36.6 C) (10/06 0453) Pulse Rate:  [73-101] 87 (10/06 0453) Resp:  [11-26] 18 (10/06 0453) BP: (110-145)/(65-96) 121/83 (10/06 0453) SpO2:  [93 %-100 %] 98 % (10/06 0453) Weight:  [99.3 kg] 99.3 kg (10/05 1835)  Intake/Output from previous day:  Intake/Output Summary (Last 24 hours) at 04/08/2021 0726 Last data filed at 04/08/2021 0541 Gross per 24 hour  Intake 3715 ml  Output 3250 ml  Net 465 ml     Intake/Output this shift: No intake/output data recorded.  Labs: Recent Labs    04/08/21 0321  HGB 14.9   Recent Labs    04/08/21 0321  WBC 15.3*  RBC 4.35  HCT 41.7  PLT 274   Recent Labs    04/08/21 0321  NA 133*  K 3.9  CL 100  CO2 25  BUN 14  CREATININE 0.72  GLUCOSE 170*  CALCIUM 8.5*   No results for input(s): LABPT, INR in the last 72 hours.  Exam: General - Patient is Alert and Oriented Extremity - Neurologically intact Neurovascular intact Intact pulses distally Dorsiflexion/Plantar flexion intact Dressing - dressing C/D/I Motor Function - intact, moving foot and toes well on exam.   Past Medical History:  Diagnosis Date   Allergy    Arthritis    Chicken pox    H/O tobacco use, presenting hazards to health 07/31/2011   Headache, cluster, episodic    hx over a yr   Injury of left shoulder 01/30/2013   Low testosterone    Nasal polyp 11/18/2013   On home oxygen therapy    ONLY uses for cluster headaches   Other and unspecified  hyperlipidemia 11/18/2013   Overweight(278.02)    Preventative health care 11/18/2013   Radiculopathy affecting upper extremity 11/20/2011   Tobacco use disorder 07/31/2011    Assessment/Plan: 1 Day Post-Op Procedure(s) (LRB): RIGHT TOTAL KNEE REVISION (Right) Principal Problem:   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   Weight as of this encounter: 99.3 kg. Up with therapy   Patient's anticipated LOS is less than 2 midnights, meeting these requirements: - Younger than 50 - Lives within 1 hour of care - Has a competent adult at home to recover with post-op  - NO history of  - Chronic pain requiring opioids  - Diabetes  - Coronary Artery Disease  - Heart failure  - Heart attack  - Stroke  - DVT/VTE  - Cardiac arrhythmia  - Respiratory Failure/COPD  - Renal failure  - Anemia  - Advanced Liver disease     DVT Prophylaxis - Aspirin and TED hose Weight bearing as tolerated. Continue therapy.  Plan is to go Home after hospital stay.  Plan for two sessions with PT this morning, and if meeting goals, will plan for discharge this afternoon.  Patient to follow up in two weeks with Dr. Wynelle Link in clinic.   The PDMP database was reviewed today prior to any opioid medications being prescribed to this patient.Fenton Foy, [place], PA-C Orthopedic Surgery [phone] 04/08/2021, 7:26 AM

## 2021-04-08 NOTE — Evaluation (Signed)
Physical Therapy Evaluation Patient Details Name: Anthony Reese MRN: [id number] DOB: 06-12-1971 Today's Date: 04/08/2021  History of Present Illness  50 y.o. male admitted 04/07/21 for revision of failed R TKA. PMH: R TKA August 2017, obesity, tachycardia, R ACL repair at age 89.  Clinical Impression  Pt is mobilizing well. He ambulated 200' with RW, no buckling of R knee. He completed stair training and demonstrates good understanding of HEP. He is ready to DC home from a PT standpoint.        Recommendations for follow up therapy are one component of a multi-disciplinary discharge planning process, led by the attending physician.  Recommendations may be updated based on patient status, additional functional criteria and insurance authorization.  Follow Up Recommendations Follow surgeon's recommendation for DC plan and follow-up therapies    Equipment Recommendations  None recommended by PT    Recommendations for Other Services       Precautions / Restrictions Precautions Precautions: Fall Restrictions Weight Bearing Restrictions: No Other Position/Activity Restrictions: WBAT      Mobility  Bed Mobility Overal bed mobility: Modified Independent             General bed mobility comments: HOB up, used rail    Transfers Overall transfer level: Needs assistance Equipment used: Rolling walker (2 wheeled) Transfers: Sit to/from Stand Sit to Stand: Supervision         General transfer comment: VCs hand placement  Ambulation/Gait Ambulation/Gait assistance: Modified independent (Device/Increase time) Gait Distance (Feet): 200 Feet Assistive device: Rolling walker (2 wheeled) Gait Pattern/deviations: Step-to pattern;Decreased step length - right;Decreased step length - left Gait velocity: WFL   General Gait Details: no loss of balance, no buckling  Stairs Stairs: Yes Stairs assistance: Min assist Stair Management: No rails;Forwards;With walker Number of Stairs:  1 General stair comments: 1 step forwards x 2 trials, Min A to steady RW  Wheelchair Mobility    Modified Rankin (Stroke Patients Only)       Balance Overall balance assessment: Modified Independent                                           Pertinent Vitals/Pain Pain Assessment: 0-10 Pain Score: 6  Pain Location: R knee Pain Descriptors / Indicators: Sore Pain Intervention(s): Limited activity within patient's tolerance;Monitored during session;RN gave pain meds during session;Ice applied;Premedicated before session    [place] expects to be discharged to:: Private residence Living Arrangements: Spouse/significant other;Children Available Help at Discharge: Family;Available 24 hours/day Type of Home: House Home Access: Stairs to enter Entrance Stairs-Rails: None Entrance Stairs-Number of Steps: 1 Home Layout: Multi-level;Able to live on main level with bedroom/bathroom Home Equipment: Gilford Rile - 2 wheels;Bedside commode      Prior Function Level of Independence: Independent         Comments: denies falls in past 1 year     Hand Dominance        Extremity/Trunk Assessment   Upper Extremity Assessment Upper Extremity Assessment: Overall WFL for tasks assessed    Lower Extremity Assessment Lower Extremity Assessment: RLE deficits/detail RLE Deficits / Details: SLR 3/5, knee AAROM 0-40* RLE Sensation: WNL RLE Coordination: WNL    Cervical / Trunk Assessment Cervical / Trunk Assessment: Normal  Communication   Communication: No difficulties  Cognition Arousal/Alertness: Awake/alert Behavior During Therapy: WFL for tasks assessed/performed Overall Cognitive Status: Within Functional Limits for tasks assessed  General Comments      Exercises Total Joint Exercises Ankle Circles/Pumps: AROM;Both;15 reps;Supine Quad Sets: AROM;Right;5 reps;Supine Short Arc Quad:  AROM;Right;10 reps;Supine Heel Slides: AAROM;Right;10 reps;Supine Hip ABduction/ADduction: AROM;Right;10 reps;Supine Straight Leg Raises: AROM;AAROM;Right;10 reps;Supine Long Arc Quad: AROM;Right;10 reps;Seated Knee Flexion: AAROM;Right;10 reps;Seated Goniometric ROM: 0-40* R knee AAROM   Assessment/Plan    PT Assessment Patent does not need any further PT services  PT Problem List         PT Treatment Interventions      PT Goals (Current goals can be found in the Care Plan section)  Acute Rehab PT Goals Patient Stated Goal: ride mountain bike again PT Goal Formulation: All assessment and education complete, DC therapy    Frequency     Barriers to discharge        Co-evaluation               AM-PAC PT "6 Clicks" Mobility  Outcome Measure Help needed turning from your back to your side while in a flat bed without using bedrails?: None Help needed moving from lying on your back to sitting on the side of a flat bed without using bedrails?: A Little Help needed moving to and from a bed to a chair (including a wheelchair)?: None Help needed standing up from a chair using your arms (e.g., wheelchair or bedside chair)?: None Help needed to walk in hospital room?: None Help needed climbing 3-5 steps with a railing? : A Little 6 Click Score: 22    End of Session Equipment Utilized During Treatment: Gait belt Activity Tolerance: Patient tolerated treatment well Patient left: in chair;with call bell/phone within reach;with chair alarm set Nurse Communication: Mobility status      Time: 2633-3545 PT Time Calculation (min) (ACUTE ONLY): 19 min   Charges:   PT Evaluation $PT Eval Low Complexity: 1 Low         Philomena Doheny PT 04/08/2021  Acute Rehabilitation Services Pager [phone] Office [phone]

## 2021-04-10 LAB — BODY FLUID CULTURE W GRAM STAIN: Culture: NO GROWTH

## 2021-04-12 LAB — AEROBIC/ANAEROBIC CULTURE W GRAM STAIN (SURGICAL/DEEP WOUND): Culture: NO GROWTH

## 2021-04-13 ENCOUNTER — Telehealth: Payer: Self-pay

## 2021-04-13 NOTE — Discharge Summary (Signed)
Physician Discharge Summary   Patient ID: Anthony Reese MRN: [id number] DOB/AGE: 07/26/1970 50 y.o.  Admit date: 04/07/2021 Discharge date: 04/08/2021  Primary Diagnosis:  s/p Right TKA Revision  Admission Diagnoses:  Past Medical History:  Diagnosis Date   Allergy    Arthritis    Chicken pox    H/O tobacco use, presenting hazards to health 07/31/2011   Headache, cluster, episodic    hx over a yr   Injury of left shoulder 01/30/2013   Low testosterone    Nasal polyp 11/18/2013   On home oxygen therapy    ONLY uses for cluster headaches   Other and unspecified hyperlipidemia 11/18/2013   Overweight(278.02)    Preventative health care 11/18/2013   Radiculopathy affecting upper extremity 11/20/2011   Tobacco use disorder 07/31/2011   Discharge Diagnoses:   Principal Problem:   Failed total knee arthroplasty ([place]) Active Problems:   Failed total right knee replacement ([place])  Estimated body mass index is 33.29 kg/m as calculated from the following:   Height as of this encounter: 5\' 8"  (1.727 m).   Weight as of this encounter: 99.3 kg.  Procedure:  Procedure(s) (LRB): RIGHT TOTAL KNEE REVISION (Right)   Consults: None  HPI: The patient is a 50 year old male, had right total knee arthroplasty done a few years ago and has had persistent pain and swelling.  Has had exam and history consistent with an unstable total knee.  Bone scan was positive for  loosening versus infection.  Lab work was equivocal with normal sed rate, barely elevated C-reactive protein, and normal cultures of the fluid.  He presents now for right knee polyethylene revision versus total knee revision versus resection  arthroplasty.  Laboratory Data: Admission on 04/07/2021, Discharged on 04/08/2021  Component Date Value Ref Range Status   SURGICAL PATHOLOGY 04/07/2021    Final-Edited                   Value:SURGICAL PATHOLOGY CASE: [id number] PATIENT: Anthony Reese Surgical Pathology  Report     Clinical History: Unstable right total knee arthroplasty ([facility])   FINAL MICROSCOPIC DIAGNOSIS:  A. SYNOVIUM, RIGHT KNEE, BIOPSY: -  Benign synovium without significant acute inflammation   INTRAOPERATIVE DIAGNOSIS:  A1. SYNOVIUM, RIGHT KNEE, FROZEN SECTION:        "No significant acute inflammation."        Rapid intraoperative consult diagnosis rendered by Dr. Vic Ripper @ 1154 04/07/2021.  GROSS DESCRIPTION:  Received fresh for rapid intraoperative consult evaluation by frozen section is a 1.6 x 1.4 x 0.5 cm portion of pink-white soft tissue which is sectioned and entirely submitted in 1 block for frozen section.  SW 04/07/2021   Final Diagnosis performed by Thressa Sheller, MD.   Electronically signed 04/08/2021 Technical component performed at [facility], [place] [address] [place], [place] [address].  Professional component performed at [employer]                         . [facility], [place] [address] [place], [place] [address].  Immunohistochemistry Technical component (if applicable) was performed at [facility]. [address], [place], [place], [place] [address].   IMMUNOHISTOCHEMISTRY DISCLAIMER (if applicable): Some of these immunohistochemical stains may have been developed and the performance characteristics determine by [facility]. Some may not have been cleared or approved by the U.S. Food and Drug Administration. The FDA has determined that such clearance or approval is not  necessary. This test is used for clinical purposes. It should not be regarded as investigational or for research. This laboratory is certified under the [place] (CLIA-88) as qualified to perform high complexity clinical laboratory testing.  The controls stained appropriately.    Specimen Description 04/07/2021    Final                   Value:TISSUE RIGHT KNEE Performed at  [place] [address], [place], [place] [address]    Special Requests 04/07/2021    Final                   Value:NONE Performed at [facility], [place] [address] [place], [place] [address]    Gram Stain 04/07/2021    Final                   Value:RARE WBC PRESENT,BOTH PMN AND MONONUCLEAR NO ORGANISMS SEEN Gram Stain Report Called to,Read Back By and Verified With: BANKS,J. RN @1233  ON 10.5.2022 BY [facility] Performed at [facility], [place] [address] [place], [place] [address]    Culture 04/07/2021    Final                   Value:No growth aerobically or anaerobically. Performed at [facility], [place] [address], [place], [place] [address]    Report Status 04/07/2021 04/12/2021 FINAL   Final   Specimen Description 04/07/2021    Final                   Value:SYNOVIAL RIGHT KNEE SYNOVIUM Performed at [facility], [place] [address], [place], [place] [address]    Special Requests 04/07/2021    Final                   Value:NONE Performed at [facility], [place] [address], [place], [place] [address]    Gram Stain 04/07/2021    Final                   Value:FEW WBC PRESENT,BOTH PMN AND MONONUCLEAR NO ORGANISMS SEEN Gram Stain Report Called to,Read Back By and Verified With: Royetta Asal RN @1233  ON 10.5.2022 BY [facility] Performed at [facility], [place] [address] [place], [place] [address]    Culture 04/07/2021    Final                   Value:NO GROWTH 3 DAYS Performed at [place] [address] [place], [place] [address]    Report Status 04/07/2021 04/10/2021 FINAL   Final   WBC 04/08/2021 15.3 (A) 4.0 - 10.5 K/uL Final   RBC 04/08/2021 4.35  4.22 - 5.81 MIL/uL Final   Hemoglobin 04/08/2021 14.9  13.0 - 17.0 g/dL Final   HCT 04/08/2021 41.7  39.0 - 52.0 % Final   MCV 04/08/2021 95.9  80.0 - 100.0 fL Final   MCH 04/08/2021 34.3 (A) 26.0 - 34.0 pg Final   MCHC  04/08/2021 35.7  30.0 - 36.0 g/dL Final   RDW 04/08/2021 12.1  11.5 - 15.5 % Final   Platelets 04/08/2021 274  150 - 400 K/uL Final   nRBC 04/08/2021 0.0  0.0 - 0.2 % Final   Performed at [facility], [place] [address] [place], [place] [address]   Sodium 04/08/2021 133 (A) 135 - 145 mmol/L Final  Potassium 04/08/2021 3.9  3.5 - 5.1 mmol/L Final   Chloride 04/08/2021 100  98 - 111 mmol/L Final   CO2 04/08/2021 25  22 - 32 mmol/L Final   Glucose, Bld 04/08/2021 170 (A) 70 - 99 mg/dL Final   Glucose reference range applies only to samples taken after fasting for at least 8 hours.   BUN 04/08/2021 14  6 - 20 mg/dL Final   Creatinine, Ser 04/08/2021 0.72  0.61 - 1.24 mg/dL Final   Calcium 04/08/2021 8.5 (A) 8.9 - 10.3 mg/dL Final   GFR, Estimated 04/08/2021 >60  >60 mL/min Final   Comment: (NOTE) Calculated using the CKD-EPI Creatinine Equation (2021)    Anion gap 04/08/2021 8  5 - 15 Final   Performed at [facility], [place] [address] [place], [place] [address]  Orders Only on 04/05/2021  Component Date Value Ref Range Status   SARS Coronavirus 2 04/05/2021 RESULT: NEGATIVE   Final   Comment: RESULT: NEGATIVESARS-CoV-2 INTERPRETATION:A NEGATIVE  test result means that SARS-CoV-2 RNA was not present in the specimen above the limit of detection of this test. This does not preclude a possible SARS-CoV-2 infection and should not be used as the  sole basis for patient management decisions. Negative results must be combined with clinical observations, patient history, and epidemiological information. Optimum specimen types and timing for peak viral levels during infections caused by SARS-CoV-2  have not been determined. Collection of multiple specimens or types of specimens may be necessary to detect virus. Improper specimen collection and handling, sequence variability under primers/probes, or organism present below the limit of detection may  lead to false  negative results. Positive and negative predictive values of testing are highly dependent on prevalence. False negative test results are more likely when prevalence of disease is high.The expected result is NEGATIVE.Fact S                          heet for  Healthcare Providers: LocalChronicle.no Sheet for Patients: SalonLookup.es Reference Range - Negative   Hospital Outpatient Visit on 03/25/2021  Component Date Value Ref Range Status   ABO/RH(D) 03/25/2021 A POS   Final   Antibody Screen 03/25/2021 NEG   Final   Sample Expiration 03/25/2021 04/08/2021,2359   Final   Extend sample reason 03/25/2021    Final                   Value:NO TRANSFUSIONS OR PREGNANCY IN THE PAST 3 MONTHS Performed at [facility], [place] [address] [place], [place] [address]    WBC 03/25/2021 7.9  4.0 - 10.5 K/uL Final   RBC 03/25/2021 5.10  4.22 - 5.81 MIL/uL Final   Hemoglobin 03/25/2021 16.8  13.0 - 17.0 g/dL Final   HCT 03/25/2021 50.0  39.0 - 52.0 % Final   MCV 03/25/2021 98.0  80.0 - 100.0 fL Final   MCH 03/25/2021 32.9  26.0 - 34.0 pg Final   MCHC 03/25/2021 33.6  30.0 - 36.0 g/dL Final   RDW 03/25/2021 12.9  11.5 - 15.5 % Final   Platelets 03/25/2021 308  150 - 400 K/uL Final   nRBC 03/25/2021 0.0  0.0 - 0.2 % Final   Performed at [facility], [place] [address] [place], [place] [address]   Sodium 03/25/2021 139  135 - 145 mmol/L Final   Potassium 03/25/2021 4.2  3.5 - 5.1 mmol/L Final   Chloride 03/25/2021 105  98 - 111 mmol/L Final   CO2 03/25/2021 24  22 - 32 mmol/L Final   Glucose, Bld 03/25/2021 98  70 - 99 mg/dL Final   Glucose reference range applies only to samples taken after fasting for at least 8 hours.   BUN 03/25/2021 15  6 - 20 mg/dL Final   Creatinine, Ser 03/25/2021 0.79  0.61 - 1.24 mg/dL Final   Calcium 03/25/2021 9.2  8.9 - 10.3 mg/dL Final   Total Protein 03/25/2021 7.8  6.5 - 8.1 g/dL  Final   Albumin 03/25/2021 4.2  3.5 - 5.0 g/dL Final   AST 03/25/2021 24  15 - 41 U/L Final   ALT 03/25/2021 29  0 - 44 U/L Final   Alkaline Phosphatase 03/25/2021 64  38 - 126 U/L Final   Total Bilirubin 03/25/2021 0.9  0.3 - 1.2 mg/dL Final   GFR, Estimated 03/25/2021 >60  >60 mL/min Final   Comment: (NOTE) Calculated using the CKD-EPI Creatinine Equation (2021)    Anion gap 03/25/2021 10  5 - 15 Final   Performed at [facility], [place] [address] [place], [place] [address]   Prothrombin Time 03/25/2021 13.5  11.4 - 15.2 seconds Final   INR 03/25/2021 1.0  0.8 - 1.2 Final   Comment: (NOTE) INR goal varies based on device and disease states. Performed at [facility], [place] [address], [place], [place] [address]    MRSA, PCR 03/25/2021 NEGATIVE  NEGATIVE Final   Staphylococcus aureus 03/25/2021 POSITIVE (A) NEGATIVE Final   Comment: (NOTE) The Xpert SA Assay (FDA approved for NASAL specimens in patients 63 years of age and older), is one component of a comprehensive surveillance program. It is not intended to diagnose infection nor to guide or monitor treatment. Performed at [facility], [place] [address] [place], [place] [address]   Office Visit on 03/19/2021  Component Date Value Ref Range Status   Troponin I 03/19/2021 4  < OR = 47 ng/L Final   Comment: . In accord with published recommendations, serial testing of troponin I at intervals of 2 to 4 hours for up to 12 to 24 hours is suggested in order to corroborate a single troponin I result. An elevated troponin alone is not sufficient to make the diagnosis of MI. [place] Kitchen      X-Rays:No results found.  EKG: Orders placed or performed in visit on 03/19/21   EKG 12-Lead     Hospital Course: Anthony Reese is a 50 y.o. who was admitted to [facility]. They were brought to the operating room on 04/07/2021 and underwent Procedure(s): RIGHT TOTAL KNEE REVISION.   Patient tolerated the procedure well and was later transferred to the recovery room and then to the orthopaedic floor for postoperative care. They were given PO and IV analgesics for pain control following their surgery. They were given 24 hours of postoperative antibiotics of  Anti-infectives (From admission, onward)    Start     Dose/Rate Route Frequency Ordered Stop   04/07/21 1800  ceFAZolin (ANCEF) IVPB 2g/100 mL premix        2 g 200 mL/hr over 30 Minutes Intravenous Every 6 hours 04/07/21 1638 04/08/21 0044   04/07/21 0845  vancomycin (VANCOCIN) IVPB 1000 mg/200 mL premix        1,000 mg 200 mL/hr over 60 Minutes Intravenous On call to O.R. 04/07/21 6734 04/07/21 1236      and started on DVT prophylaxis in the form  of Aspirin and TED hose.   PT and OT were ordered for total joint protocol. Discharge planning consulted to help with postop disposition and equipment needs.  Patient had an uneventful night on the evening of surgery. They started to get up OOB with therapy on POD#1. Pt was seen during rounds and was ready to go home pending progress with therapy. He worked with therapy on POD #1 and was meeting goals. Pt was discharged to home later that day in stable condition.  Diet: Regular diet Activity: WBAT Follow-up: in 2 weeks Disposition: Home Discharged Condition: good   Discharge Instructions     Call MD / Call 911   Complete by: As directed    If you experience chest pain or shortness of breath, CALL 911 and be transported to the hospital emergency room.  If you develope a fever above 101 F, pus (white drainage) or increased drainage or redness at the wound, or calf pain, call your surgeon's office.   Change dressing   Complete by: As directed    You may remove the bulky bandage (ACE wrap and gauze) two days after surgery. You will have an adhesive waterproof bandage underneath. Leave this in place until your first follow-up appointment.   Constipation Prevention   Complete  by: As directed    Drink plenty of fluids.  Prune juice may be helpful.  You may use a stool softener, such as Colace (over the counter) 100 mg twice a day.  Use MiraLax (over the counter) for constipation as needed.   Diet - low sodium heart healthy   Complete by: As directed    Do not put a pillow under the knee. Place it under the heel.   Complete by: As directed    Driving restrictions   Complete by: As directed    No driving for two weeks   Post-operative opioid taper instructions:   Complete by: As directed    POST-OPERATIVE OPIOID TAPER INSTRUCTIONS: It is important to wean off of your opioid medication as soon as possible. If you do not need pain medication after your surgery it is ok to stop day one. Opioids include: Codeine, Hydrocodone(Norco, Vicodin), Oxycodone(Percocet, oxycontin) and hydromorphone amongst others.  Long term and even short term use of opiods can cause: Increased pain response Dependence Constipation Depression Respiratory depression And more.  Withdrawal symptoms can include Flu like symptoms Nausea, vomiting And more Techniques to manage these symptoms Hydrate well Eat regular healthy meals Stay active Use relaxation techniques(deep breathing, meditating, yoga) Do Not substitute Alcohol to help with tapering If you have been on opioids for less than two weeks and do not have pain than it is ok to stop all together.  Plan to wean off of opioids This plan should start within one week post op of your joint replacement. Maintain the same interval or time between taking each dose and first decrease the dose.  Cut the total daily intake of opioids by one tablet each day Next start to increase the time between doses. The last dose that should be eliminated is the evening dose.      TED hose   Complete by: As directed    Use stockings (TED hose) for three weeks on both leg(s).  You may remove them at night for sleeping.   Weight bearing as tolerated    Complete by: As directed       Allergies as of 04/08/2021       Reactions   Imitrex [  sumatriptan] Anaphylaxis   Swelling around eyes and shortness of breath   Verapamil Anaphylaxis   Swelling around eyes and shortness of breath   Penicillins Other (See Comments)   Unknown reaction as a child.        Medication List     STOP taking these medications    diclofenac 75 MG EC tablet Commonly known as: VOLTAREN       TAKE these medications    aspirin 325 MG EC tablet Take 1 tablet (325 mg total) by mouth 2 (two) times daily. Then take one 81 mg aspirin once a day for three weeks. Then discontinue aspirin.   cetirizine 10 MG tablet Commonly known as: ZYRTEC Take 10 mg by mouth 2 (two) times daily as needed for allergies.   cyclobenzaprine 10 MG tablet Commonly known as: FLEXERIL Take 1 tablet (10 mg total) by mouth 3 (three) times daily as needed for muscle spasms. What changed:  when to take this reasons to take this   fluticasone 50 MCG/ACT nasal spray Commonly known as: FLONASE Place 2 sprays into both nostrils daily as needed for allergies or rhinitis.   oxyCODONE 5 MG immediate release tablet Commonly known as: Oxy IR/ROXICODONE Take 1-2 tablets (5-10 mg total) by mouth every 6 (six) hours as needed for severe pain.   rosuvastatin 40 MG tablet Commonly known as: CRESTOR Take 1 tablet (40 mg total) by mouth daily.   testosterone cypionate 200 MG/ML injection Commonly known as: DEPOTESTOSTERONE CYPIONATE Inject 1 mL (200 mg total) into the muscle every 14 (fourteen) days.   traMADol 50 MG tablet Commonly known as: ULTRAM Take 1-2 tablets (50-100 mg total) by mouth every 6 (six) hours as needed for moderate pain.               Discharge Care Instructions  (From admission, onward)           Start     Ordered   04/08/21 0000  Weight bearing as tolerated        04/08/21 0732   04/08/21 0000  Change dressing       Comments: You may remove  the bulky bandage (ACE wrap and gauze) two days after surgery. You will have an adhesive waterproof bandage underneath. Leave this in place until your first follow-up appointment.   04/08/21 0732            Follow-up Information     Gaynelle Arabian, MD. Schedule an appointment as soon as possible for a visit on 04/20/2021.   Specialty: Orthopedic Surgery Contact information: [address] [place] [address] [phone]                 Signed: Fenton Foy, MBA, PA-C Orthopedic Surgery 04/13/2021, 11:56 AM

## 2021-04-13 NOTE — Telephone Encounter (Signed)
Transition Care Management Unsuccessful Follow-up Telephone Call  Date of discharge and from where:  [facility] 04/07/21-04/08/21  Attempts:  1st Attempt  Reason for unsuccessful TCM follow-up call:  No answer/busy  Johnney Killian, RN, BSN, CCM Care Management Coordinator [facility] Phone: [phone] / Fax: [phone]

## 2021-04-14 ENCOUNTER — Telehealth: Payer: Self-pay

## 2021-04-14 NOTE — Telephone Encounter (Signed)
Transition Care Management Follow-up Telephone Call Date of discharge and from where: 04/08/2021   [facility] How have you been since you were released from the hospital? "Doing good, started PT yesterday" Any questions or concerns? No  Items Reviewed: Did the pt receive and understand the discharge instructions provided? Yes  Medications obtained and verified? Yes  Other? No  Any new allergies since your discharge? No  Dietary orders reviewed? Yes Do you have support at home? Yes   Home Care and Equipment/Supplies: Were home health services ordered? no If so, what is the name of the agency?  Has the agency set up a time to come to the patient's home? not applicable Were any new equipment or medical supplies ordered?  No What is the name of the medical supply agency?  Were you able to get the supplies/equipment? not applicable Do you have any questions related to the use of the equipment or supplies? No  Functional Questionnaire: (I = Independent and D = Dependent) ADLs: I  Bathing/Dressing- I  Meal Prep- I  Eating- I  Maintaining continence- I  Transferring/Ambulation- I  Managing Meds- I  Follow up appointments reviewed:  PCP Hospital f/u appt confirmed? No   Specialist Hospital f/u appt confirmed? Yes  Scheduled to see ORTHO on 04/20/2021  Are transportation arrangements needed? No  If their condition worsens, is the pt aware to call PCP or go to the Emergency Dept.? Yes Was the patient provided with contact information for the PCP's office or ED? Yes Was to pt encouraged to call back with questions or concerns? Yes  Tomasa Rand, RN, BSN, CEN [facility] [employer] [phone]

## 2021-12-13 ENCOUNTER — Encounter: Payer: Self-pay | Admitting: Family Medicine

## 2021-12-13 DIAGNOSIS — Z1211 Encounter for screening for malignant neoplasm of colon: Secondary | ICD-10-CM

## 2021-12-15 ENCOUNTER — Encounter: Payer: Self-pay | Admitting: Family Medicine

## 2021-12-15 DIAGNOSIS — E349 Endocrine disorder, unspecified: Secondary | ICD-10-CM

## 2021-12-15 MED ORDER — TESTOSTERONE CYPIONATE 200 MG/ML IM SOLN: 200.0000 mg | INTRAMUSCULAR | 1 refills | Status: DC

## 2021-12-15 NOTE — Telephone Encounter (Signed)
Requesting: testosterone '200mg'$ /mL Contract: n/a UDS: n/a Last Visit: 03/19/21 Next Visit: 01/18/22 Last Refill: 12/06/20 #81m and 1RF  Please Advise

## 2022-01-17 NOTE — Progress Notes (Unsigned)
Subjective:    Patient ID: Anthony Reese, male    DOB: October 07, 1970, 51 y.o.   MRN: [id number]  No chief complaint on file.   HPI Patient is in today for a CPE.  Past Medical History:  Diagnosis Date   Allergy    Arthritis    Chicken pox    H/O tobacco use, presenting hazards to health 07/31/2011   Headache, cluster, episodic    hx over a yr   Injury of left shoulder 01/30/2013   Low testosterone    Nasal polyp 11/18/2013   On home oxygen therapy    ONLY uses for cluster headaches   Other and unspecified hyperlipidemia 11/18/2013   Overweight(278.02)    Preventative health care 11/18/2013   Radiculopathy affecting upper extremity 11/20/2011   Tobacco use disorder 07/31/2011    Past Surgical History:  Procedure Laterality Date   APPENDECTOMY     BICEPS TENDON REPAIR  2012   right   CARPAL TUNNEL RELEASE Bilateral 2013   right   KNEE SURGERY Right 2013   acl   NASAL SINUS SURGERY  2016   NASAL SINUS SURGERY     x2   TOTAL KNEE ARTHROPLASTY Right 02/19/2016   Procedure: RIGHT TOTAL KNEE ARTHROPLASTY;  Surgeon: Dorna Leitz, MD;  Location: [facility];  Service: Orthopedics;  Laterality: Right;   TOTAL KNEE REVISION Right 04/07/2021   Procedure: RIGHT TOTAL KNEE REVISION;  Surgeon: Gaynelle Arabian, MD;  Location: [facility];  Service: Orthopedics;  Laterality: Right;    Family History  Problem Relation Age of Onset   Cancer Mother        leukemia, pancreatic cancer   Cholelithiasis Sister    Cancer Maternal Aunt        lung   Colon polyps Maternal Grandfather    Colon cancer Maternal Grandfather    Cancer Maternal Grandfather        colon    Esophageal cancer Neg Hx    Stomach cancer Neg Hx    Rectal cancer Neg Hx     Social History   Socioeconomic History   Marital status: Married    Spouse name: Not on file   Number of children: Not on file   Years of education: Not on file   Highest education level: Not on file  Occupational History   Not on file  Tobacco  Use   Smoking status: Some Days    Packs/day: 0.25    Years: 3.00    Total pack years: 0.75    Types: Cigarettes, Cigars    Last attempt to quit: 01/31/2012    Years since quitting: 9.9   Smokeless tobacco: Never  Vaping Use   Vaping Use: Never used  Substance and Sexual Activity   Alcohol use: Yes    Alcohol/week: 14.0 standard drinks of alcohol    Types: 14 Cans of beer per week    Comment: 1-2 beer during week and social on weekends   Drug use: No   Sexual activity: Yes    Partners: Female    Comment: lives with wife, works for [employer], no dietary restrictions.  Other Topics Concern   Not on file  Social History Narrative   Not on file   Social Determinants of Health   Financial Resource Strain: Not on file  Food Insecurity: Not on file  Transportation Needs: Not on file  Physical Activity: Not on file  Stress: Not on file  Social Connections: Not on file  Intimate Partner Violence: Not on file    Outpatient Medications Prior to Visit  Medication Sig Dispense Refill   aspirin EC 325 MG EC tablet Take 1 tablet (325 mg total) by mouth 2 (two) times daily. Then take one 81 mg aspirin once a day for three weeks. Then discontinue aspirin. 42 tablet 0   cetirizine (ZYRTEC) 10 MG tablet Take 10 mg by mouth 2 (two) times daily as needed for allergies.     cyclobenzaprine (FLEXERIL) 10 MG tablet Take 1 tablet (10 mg total) by mouth 3 (three) times daily as needed for muscle spasms. 30 tablet 0   fluticasone (FLONASE) 50 MCG/ACT nasal spray Place 2 sprays into both nostrils daily as needed for allergies or rhinitis.     oxyCODONE (OXY IR/ROXICODONE) 5 MG immediate release tablet Take 1-2 tablets (5-10 mg total) by mouth every 6 (six) hours as needed for severe pain. 42 tablet 0   rosuvastatin (CRESTOR) 40 MG tablet Take 1 tablet (40 mg total) by mouth daily. 90 tablet 1   testosterone cypionate (DEPOTESTOSTERONE CYPIONATE) 200 MG/ML injection Inject 1 mL  (200 mg total) into the muscle every 14 (fourteen) days. 12 mL 1   traMADol (ULTRAM) 50 MG tablet Take 1-2 tablets (50-100 mg total) by mouth every 6 (six) hours as needed for moderate pain. 40 tablet 0   No facility-administered medications prior to visit.    Allergies  Allergen Reactions   Imitrex [Sumatriptan] Anaphylaxis    Swelling around eyes and shortness of breath   Verapamil Anaphylaxis    Swelling around eyes and shortness of breath   Penicillins Other (See Comments)    Unknown reaction as a child.    ROS     Objective:    Physical Exam  There were no vitals taken for this visit. Wt Readings from Last 3 Encounters:  04/07/21 218 lb 14.7 oz (99.3 kg)  03/25/21 219 lb (99.3 kg)  03/19/21 224 lb (101.6 kg)    Diabetic Foot Exam - Simple   No data filed    Lab Results  Component Value Date   WBC 15.3 (H) 04/08/2021   HGB 14.9 04/08/2021   HCT 41.7 04/08/2021   PLT 274 04/08/2021   GLUCOSE 170 (H) 04/08/2021   CHOL 214 (H) 11/23/2020   TRIG 153.0 (H) 11/23/2020   HDL 48.80 11/23/2020   LDLDIRECT 122.0 12/31/2019   LDLCALC 135 (H) 11/23/2020   ALT 29 03/25/2021   AST 24 03/25/2021   NA 133 (L) 04/08/2021   K 3.9 04/08/2021   CL 100 04/08/2021   CREATININE 0.72 04/08/2021   BUN 14 04/08/2021   CO2 25 04/08/2021   TSH 1.05 11/23/2020   PSA 0.74 12/31/2019   INR 1.0 03/25/2021    Lab Results  Component Value Date   TSH 1.05 11/23/2020   Lab Results  Component Value Date   WBC 15.3 (H) 04/08/2021   HGB 14.9 04/08/2021   HCT 41.7 04/08/2021   MCV 95.9 04/08/2021   PLT 274 04/08/2021   Lab Results  Component Value Date   NA 133 (L) 04/08/2021   K 3.9 04/08/2021   CO2 25 04/08/2021   GLUCOSE 170 (H) 04/08/2021   BUN 14 04/08/2021   CREATININE 0.72 04/08/2021   BILITOT 0.9 03/25/2021   ALKPHOS 64 03/25/2021   AST 24 03/25/2021   ALT 29 03/25/2021   PROT 7.8 03/25/2021   ALBUMIN 4.2 03/25/2021   CALCIUM 8.5 (L) 04/08/2021   ANIONGAP 8  04/08/2021   GFR 103.25 12/08/2020   Lab Results  Component Value Date   CHOL 214 (H) 11/23/2020   Lab Results  Component Value Date   HDL 48.80 11/23/2020   Lab Results  Component Value Date   LDLCALC 135 (H) 11/23/2020   Lab Results  Component Value Date   TRIG 153.0 (H) 11/23/2020   Lab Results  Component Value Date   CHOLHDL 4 11/23/2020   No results found for: "HGBA1C"     Assessment & Plan:   COLONOSCOPY: needs PAP: n/a PSA: 12/31/2019 DEXA: n/a   Problem List Items Addressed This Visit   None Visit Diagnoses     Preventative health care    -  Primary       I am having Anthony Reese maintain his rosuvastatin, cetirizine, fluticasone, aspirin EC, oxyCODONE, traMADol, cyclobenzaprine, and testosterone cypionate.  No orders of the defined types were placed in this encounter.

## 2022-01-18 ENCOUNTER — Encounter: Payer: Self-pay | Admitting: Family Medicine

## 2022-01-18 ENCOUNTER — Ambulatory Visit (INDEPENDENT_AMBULATORY_CARE_PROVIDER_SITE_OTHER): Payer: BC Managed Care – PPO | Admitting: Family Medicine

## 2022-01-18 VITALS — BP 124/84 | HR 85 | Resp 20 | Ht 68.0 in | Wt 234.6 lb

## 2022-01-18 DIAGNOSIS — Z0000 Encounter for general adult medical examination without abnormal findings: Secondary | ICD-10-CM

## 2022-01-18 DIAGNOSIS — E785 Hyperlipidemia, unspecified: Secondary | ICD-10-CM

## 2022-01-18 DIAGNOSIS — M545 Low back pain, unspecified: Secondary | ICD-10-CM

## 2022-01-18 DIAGNOSIS — M25561 Pain in right knee: Secondary | ICD-10-CM

## 2022-01-18 DIAGNOSIS — R7989 Other specified abnormal findings of blood chemistry: Secondary | ICD-10-CM | POA: Diagnosis not present

## 2022-01-18 DIAGNOSIS — Z1211 Encounter for screening for malignant neoplasm of colon: Secondary | ICD-10-CM | POA: Diagnosis not present

## 2022-01-18 DIAGNOSIS — E349 Endocrine disorder, unspecified: Secondary | ICD-10-CM | POA: Diagnosis not present

## 2022-01-18 DIAGNOSIS — R351 Nocturia: Secondary | ICD-10-CM | POA: Diagnosis not present

## 2022-01-18 DIAGNOSIS — G44019 Episodic cluster headache, not intractable: Secondary | ICD-10-CM

## 2022-01-18 NOTE — Assessment & Plan Note (Signed)
Encouraged increased hydration, 64 ounces of clear fluids daily. Minimize alcohol and caffeine. Eat small frequent meals with lean proteins and complex carbs. Avoid high and low blood sugars. Get adequate sleep, 7-8 hours a night. Needs exercise daily preferably in the morning. Is doing well at present

## 2022-01-18 NOTE — Assessment & Plan Note (Signed)
Patient encouraged to maintain heart healthy diet, regular exercise, adequate sleep. Consider daily probiotics. Take medications as prescribed. Labs ordered and reviewed.  COLONOSCOPY: needs, will refer again  PSA: 12/31/2019, will repeat today

## 2022-01-18 NOTE — Patient Instructions (Addendum)
Call [facility] to see if they have aqua therapy  Pure or MIND diet   Shingrix is the new shingles shot, 2 shots over 2-6 months, confirm coverage with insurance and document, then can return here for shots with nurse appt or at pharmacy   Ice cap for headaches CBD Daily extra strength cream, mint, online Lidocaine gel Voltaren   Preventive Care 50-50 Years Old, Male Preventive care refers to lifestyle choices and visits with your health care provider that can promote health and wellness. Preventive care visits are also called wellness exams. What can I expect for my preventive care visit? Counseling During your preventive care visit, your health care provider may ask about your: Medical history, including: Past medical problems. Family medical history. Current health, including: Emotional well-being. Home life and relationship well-being. Sexual activity. Lifestyle, including: Alcohol, nicotine or tobacco, and drug use. Access to firearms. Diet, exercise, and sleep habits. Safety issues such as seatbelt and bike helmet use. Sunscreen use. Work and work Statistician. Physical exam Your health care provider will check your: Height and weight. These may be used to calculate your BMI (body mass index). BMI is a measurement that tells if you are at a healthy weight. Waist circumference. This measures the distance around your waistline. This measurement also tells if you are at a healthy weight and may help predict your risk of certain diseases, such as type 2 diabetes and high blood pressure. Heart rate and blood pressure. Body temperature. Skin for abnormal spots. What immunizations do I need?  Vaccines are usually given at various ages, according to a schedule. Your health care provider will recommend vaccines for you based on your age, medical history, and lifestyle or other factors, such as travel or where you work. What tests do I need? Screening Your health care  provider may recommend screening tests for certain conditions. This may include: Lipid and cholesterol levels. Diabetes screening. This is done by checking your blood sugar (glucose) after you have not eaten for a while (fasting). Hepatitis B test. Hepatitis C test. HIV (human immunodeficiency virus) test. STI (sexually transmitted infection) testing, if you are at risk. Lung cancer screening. Prostate cancer screening. Colorectal cancer screening. Talk with your health care provider about your test results, treatment options, and if necessary, the need for more tests. Follow these instructions at home: Eating and drinking  Eat a diet that includes fresh fruits and vegetables, whole grains, lean protein, and low-fat dairy products. Take vitamin and mineral supplements as recommended by your health care provider. Do not drink alcohol if your health care provider tells you not to drink. If you drink alcohol: Limit how much you have to 0-2 drinks a day. Know how much alcohol is in your drink. In the [place] one drink equals one 12 oz bottle of beer (355 mL), one 5 oz glass of wine (148 mL), or one 1 oz glass of hard liquor (44 mL). Lifestyle Brush your teeth every morning and night with fluoride toothpaste. Floss one time each day. Exercise for at least 30 minutes 5 or more days each week. Do not use any products that contain nicotine or tobacco. These products include cigarettes, chewing tobacco, and vaping devices, such as e-cigarettes. If you need help quitting, ask your health care provider. Do not use drugs. If you are sexually active, practice safe sex. Use a condom or other form of protection to prevent STIs. Take aspirin only as told by your health care provider. Make sure that you  understand how much to take and what form to take. Work with your health care provider to find out whether it is safe and beneficial for you to take aspirin daily. Find healthy ways to manage stress, such  as: Meditation, yoga, or listening to music. Journaling. Talking to a trusted person. Spending time with friends and family. Minimize exposure to UV radiation to reduce your risk of skin cancer. Safety Always wear your seat belt while driving or riding in a vehicle. Do not drive: If you have been drinking alcohol. Do not ride with someone who has been drinking. When you are tired or distracted. While texting. If you have been using any mind-altering substances or drugs. Wear a helmet and other protective equipment during sports activities. If you have firearms in your house, make sure you follow all gun safety procedures. What's next? Go to your health care provider once a year for an annual wellness visit. Ask your health care provider how often you should have your eyes and teeth checked. Stay up to date on all vaccines. This information is not intended to replace advice given to you by your health care provider. Make sure you discuss any questions you have with your health care provider. Document Revised: 12/16/2020 Document Reviewed: 12/16/2020 Elsevier Patient Education  [employer].

## 2022-01-18 NOTE — Assessment & Plan Note (Signed)
Supplement and monitor 

## 2022-01-18 NOTE — Assessment & Plan Note (Signed)
Follows with [facility]. Continue that care and add topical treatments as directed

## 2022-01-18 NOTE — Assessment & Plan Note (Addendum)
Tolerating statin, encouraged heart healthy diet, avoid trans fats, minimize simple carbs and saturated fats. Increase exercise as tolerated, only taking his Rosuvastatin 1/2 the days of the month

## 2022-01-19 DIAGNOSIS — R351 Nocturia: Secondary | ICD-10-CM | POA: Insufficient documentation

## 2022-01-19 DIAGNOSIS — M545 Low back pain, unspecified: Secondary | ICD-10-CM | POA: Insufficient documentation

## 2022-01-19 NOTE — Assessment & Plan Note (Signed)
Worse since his knee surgery and being monitored and managed by [facility] but unfortunately he has not responded to treatment so far. Encouraged moist heat and gentle stretching as tolerated. May try NSAIDs and prescription meds as directed and report if symptoms worsen or seek immediate care

## 2022-01-20 ENCOUNTER — Encounter: Payer: Self-pay | Admitting: Family Medicine

## 2022-01-21 ENCOUNTER — Other Ambulatory Visit (INDEPENDENT_AMBULATORY_CARE_PROVIDER_SITE_OTHER): Payer: BC Managed Care – PPO

## 2022-01-21 DIAGNOSIS — E785 Hyperlipidemia, unspecified: Secondary | ICD-10-CM

## 2022-01-21 DIAGNOSIS — R7989 Other specified abnormal findings of blood chemistry: Secondary | ICD-10-CM | POA: Diagnosis not present

## 2022-01-21 DIAGNOSIS — R351 Nocturia: Secondary | ICD-10-CM

## 2022-01-21 DIAGNOSIS — E349 Endocrine disorder, unspecified: Secondary | ICD-10-CM | POA: Diagnosis not present

## 2022-01-21 DIAGNOSIS — Z0000 Encounter for general adult medical examination without abnormal findings: Secondary | ICD-10-CM | POA: Diagnosis not present

## 2022-01-21 LAB — CBC WITH DIFFERENTIAL/PLATELET
Basophils Absolute: 0.1 10*3/uL (ref 0.0–0.1)
Basophils Relative: 1.3 % (ref 0.0–3.0)
Eosinophils Absolute: 0.3 10*3/uL (ref 0.0–0.7)
Eosinophils Relative: 4.6 % (ref 0.0–5.0)
HCT: 46.9 % (ref 39.0–52.0)
Hemoglobin: 16 g/dL (ref 13.0–17.0)
Lymphocytes Relative: 24 % (ref 12.0–46.0)
Lymphs Abs: 1.7 10*3/uL (ref 0.7–4.0)
MCHC: 34.1 g/dL (ref 30.0–36.0)
MCV: 97.5 fl (ref 78.0–100.0)
Monocytes Absolute: 0.7 10*3/uL (ref 0.1–1.0)
Monocytes Relative: 9.9 % (ref 3.0–12.0)
Neutro Abs: 4.2 10*3/uL (ref 1.4–7.7)
Neutrophils Relative %: 60.2 % (ref 43.0–77.0)
Platelets: 273 10*3/uL (ref 150.0–400.0)
RBC: 4.81 Mil/uL (ref 4.22–5.81)
RDW: 12.8 % (ref 11.5–15.5)
WBC: 7 10*3/uL (ref 4.0–10.5)

## 2022-01-21 LAB — COMPREHENSIVE METABOLIC PANEL
ALT: 28 U/L (ref 0–53)
AST: 24 U/L (ref 0–37)
Albumin: 4.4 g/dL (ref 3.5–5.2)
Alkaline Phosphatase: 79 U/L (ref 39–117)
BUN: 11 mg/dL (ref 6–23)
CO2: 25 mEq/L (ref 19–32)
Calcium: 9.1 mg/dL (ref 8.4–10.5)
Chloride: 100 mEq/L (ref 96–112)
Creatinine, Ser: 0.73 mg/dL (ref 0.40–1.50)
GFR: 106.1 mL/min (ref >60.00)
Glucose, Bld: 94 mg/dL (ref 70–99)
Potassium: 4.3 mEq/L (ref 3.5–5.1)
Sodium: 135 mEq/L (ref 135–145)
Total Bilirubin: 0.7 mg/dL (ref 0.2–1.2)
Total Protein: 7.1 g/dL (ref 6.0–8.3)

## 2022-01-21 LAB — LIPID PANEL
Cholesterol: 206 mg/dL — ABNORMAL HIGH (ref 0–200)
HDL: 44.8 mg/dL (ref >39.00)
NonHDL: 160.99
Total CHOL/HDL Ratio: 5
Triglycerides: 239 mg/dL — ABNORMAL HIGH (ref 0.0–149.0)
VLDL: 47.8 mg/dL — ABNORMAL HIGH (ref 0.0–40.0)

## 2022-01-21 LAB — TESTOSTERONE: Testosterone: 154.57 ng/dL — ABNORMAL LOW (ref 300.00–890.00)

## 2022-01-21 LAB — TSH: TSH: 1.76 u[IU]/mL (ref 0.35–5.50)

## 2022-01-21 LAB — LDL CHOLESTEROL, DIRECT: Direct LDL: 141 mg/dL

## 2022-01-21 LAB — PSA: PSA: 0.53 ng/mL (ref 0.10–4.00)

## 2022-01-21 NOTE — Addendum Note (Signed)
Addended by: Kelle Darting A on: 01/21/2022 09:19 AM   Modules accepted: Orders

## 2022-03-03 ENCOUNTER — Other Ambulatory Visit: Payer: Self-pay

## 2022-03-03 DIAGNOSIS — E349 Endocrine disorder, unspecified: Secondary | ICD-10-CM

## 2022-03-03 MED ORDER — TESTOSTERONE CYPIONATE 200 MG/ML IM SOLN: 200.0000 mg | INTRAMUSCULAR | 1 refills | Status: DC

## 2022-03-03 NOTE — Telephone Encounter (Signed)
Can send Testosterone CYP SDV 1ML to [facility] instead of [facility].

## 2022-03-15 ENCOUNTER — Other Ambulatory Visit: Payer: Self-pay

## 2022-03-15 ENCOUNTER — Telehealth: Payer: Self-pay | Admitting: Family Medicine

## 2022-03-15 DIAGNOSIS — E349 Endocrine disorder, unspecified: Secondary | ICD-10-CM

## 2022-03-15 MED ORDER — TESTOSTERONE CYPIONATE 200 MG/ML IM SOLN: 200.0000 mg | INTRAMUSCULAR | 1 refills | Status: DC

## 2022-03-15 NOTE — Telephone Encounter (Signed)
Medication:   testosterone cypionate (DEPOTESTOSTERONE CYPIONATE) 200 MG/ML injection [[id number]]  Has the patient contacted their pharmacy? No. (If no, request that the patient contact the pharmacy for the refill.) (If yes, when and what did the pharmacy advise?)  Preferred Pharmacy (with phone number or street name):   [place], [place]  [address], [place] [address]  Phone:  [phone]  Fax:  [phone]   Agent: Please be advised that RX refills may take up to 3 business days. We ask that you follow-up with your pharmacy.

## 2022-03-16 ENCOUNTER — Other Ambulatory Visit: Payer: Self-pay | Admitting: Family Medicine

## 2022-03-16 DIAGNOSIS — E349 Endocrine disorder, unspecified: Secondary | ICD-10-CM

## 2022-03-16 MED ORDER — TESTOSTERONE CYPIONATE 200 MG/ML IM SOLN: 200.0000 mg | INTRAMUSCULAR | 1 refills | Status: DC

## 2022-03-28 ENCOUNTER — Other Ambulatory Visit (HOSPITAL_BASED_OUTPATIENT_CLINIC_OR_DEPARTMENT_OTHER): Payer: Self-pay

## 2022-07-12 ENCOUNTER — Encounter: Payer: Self-pay | Admitting: Gastroenterology

## 2022-07-18 ENCOUNTER — Encounter: Payer: Self-pay | Admitting: Family Medicine

## 2022-07-19 ENCOUNTER — Other Ambulatory Visit: Payer: Self-pay

## 2022-07-19 ENCOUNTER — Other Ambulatory Visit: Payer: Self-pay | Admitting: Family Medicine

## 2022-07-19 DIAGNOSIS — E349 Endocrine disorder, unspecified: Secondary | ICD-10-CM

## 2022-07-19 MED ORDER — TESTOSTERONE CYPIONATE 200 MG/ML IM SOLN: 200.0000 mg | INTRAMUSCULAR | 1 refills | Status: DC

## 2022-07-20 ENCOUNTER — Other Ambulatory Visit: Payer: Self-pay

## 2022-07-20 DIAGNOSIS — E349 Endocrine disorder, unspecified: Secondary | ICD-10-CM

## 2022-07-25 ENCOUNTER — Other Ambulatory Visit: Payer: Self-pay

## 2022-07-25 ENCOUNTER — Ambulatory Visit (AMBULATORY_SURGERY_CENTER): Payer: Self-pay

## 2022-07-25 VITALS — Ht 68.0 in | Wt 237.6 lb

## 2022-07-25 DIAGNOSIS — Z1211 Encounter for screening for malignant neoplasm of colon: Secondary | ICD-10-CM

## 2022-07-25 MED ORDER — NA SULFATE-K SULFATE-MG SULF 17.5-3.13-1.6 GM/177ML PO SOLN: 1.0000 | Freq: Once | ORAL | 0 refills | Status: AC

## 2022-07-25 NOTE — Progress Notes (Signed)
Denies allergies to eggs or soy products. Denies complication of anesthesia or sedation. Denies use of weight loss medication. Denies use of O2.   [facility] instructions given for colonoscopy. Patient states that he uses O2 at home for cluster headaches because he is allergic to the standard medications for migraines. Anthony Reese was previously notified and cleared the patient for [place]. The attached previous note is available for review and confirmation. Per patient he has not had to use O2 in over a year.

## 2022-08-01 ENCOUNTER — Encounter: Payer: Self-pay | Admitting: Gastroenterology

## 2022-08-08 ENCOUNTER — Ambulatory Visit (AMBULATORY_SURGERY_CENTER): Payer: BC Managed Care – PPO | Admitting: Gastroenterology

## 2022-08-08 ENCOUNTER — Encounter: Payer: Self-pay | Admitting: Gastroenterology

## 2022-08-08 VITALS — BP 126/89 | HR 68 | Temp 98.7°F | Resp 19 | Ht 68.0 in | Wt 237.6 lb

## 2022-08-08 DIAGNOSIS — D122 Benign neoplasm of ascending colon: Secondary | ICD-10-CM

## 2022-08-08 DIAGNOSIS — D125 Benign neoplasm of sigmoid colon: Secondary | ICD-10-CM

## 2022-08-08 DIAGNOSIS — Z1211 Encounter for screening for malignant neoplasm of colon: Secondary | ICD-10-CM

## 2022-08-08 DIAGNOSIS — K635 Polyp of colon: Secondary | ICD-10-CM

## 2022-08-08 MED ORDER — SODIUM CHLORIDE 0.9 % IV SOLN: 500.0000 mL | Freq: Once | INTRAVENOUS | Status: DC

## 2022-08-08 NOTE — Op Note (Signed)
[facility] Patient Name: Anthony Reese Procedure Date: 08/08/2022 2:03 PM MRN: [id number] Endoscopist: Nicki Reaper E. Candis Schatz , MD, [id number] Age: 52 Referring MD:  Date of Birth: 1970-12-25 Gender: Male Account #: [id number] Procedure:                Colonoscopy Indications:              Screening for colorectal malignant neoplasm, This                            is the patient's first colonoscopy Medicines:                Monitored Anesthesia Care Procedure:                Pre-Anesthesia Assessment:                           - Prior to the procedure, a History and Physical                            was performed, and patient medications and                            allergies were reviewed. The patient's tolerance of                            previous anesthesia was also reviewed. The risks                            and benefits of the procedure and the sedation                            options and risks were discussed with the patient.                            All questions were answered, and informed consent                            was obtained. Prior Anticoagulants: The patient has                            taken no anticoagulant or antiplatelet agents. ASA                            Grade Assessment: II - A patient with mild systemic                            disease. After reviewing the risks and benefits,                            the patient was deemed in satisfactory condition to                            undergo the procedure.  After obtaining informed consent, the colonoscope                            was passed under direct vision. Throughout the                            procedure, the patient's blood pressure, pulse, and                            oxygen saturations were monitored continuously. The                            Olympus CF-HQ190L [id number]) Colonoscope was                            introduced through the  anus and advanced to the the                            terminal ileum, with identification of the                            appendiceal orifice and IC valve. The colonoscopy                            was performed without difficulty. The patient                            tolerated the procedure well. The quality of the                            bowel preparation was good. The terminal ileum,                            ileocecal valve, appendiceal orifice, and rectum                            were photographed. The bowel preparation used was                            SUPREP via split dose instruction. Scope In: 2:06:37 PM Scope Out: 2:20:57 PM Scope Withdrawal Time: 0 hours 11 minutes 55 seconds  Total Procedure Duration: 0 hours 14 minutes 20 seconds  Findings:                 The perianal and digital rectal examinations were                            normal. Pertinent negatives include normal                            sphincter tone and no palpable rectal lesions.                           Two sessile polyps were found in the ascending  colon. The polyps were 2 to 3 mm in size. These                            polyps were removed with a cold snare. Resection                            and retrieval were complete. Estimated blood loss                            was minimal.                           A 3 mm polyp was found in the sigmoid colon. The                            polyp was flat. The polyp was removed with a cold                            snare. Resection and retrieval were complete.                            Estimated blood loss was minimal.                           The exam was otherwise normal throughout the                            examined colon.                           The terminal ileum appeared normal.                           The retroflexed view of the distal rectum and anal                            verge was normal and  showed no anal or rectal                            abnormalities. Complications:            No immediate complications. Estimated Blood Loss:     Estimated blood loss was minimal. Impression:               - Two 2 to 3 mm polyps in the ascending colon,                            removed with a cold snare. Resected and retrieved.                           - One 3 mm polyp in the sigmoid colon, removed with                            a cold snare. Resected and retrieved.                           -  The examined portion of the ileum was normal.                           - The distal rectum and anal verge are normal on                            retroflexion view. Recommendation:           - Patient has a contact number available for                            emergencies. The signs and symptoms of potential                            delayed complications were discussed with the                            patient. Return to normal activities tomorrow.                            Written discharge instructions were provided to the                            patient.                           - Resume previous diet.                           - Continue present medications.                           - Await pathology results.                           - Repeat colonoscopy (date not yet determined) for                            surveillance based on pathology results. Remmi Armenteros E. Candis Schatz, MD 08/08/2022 2:25:26 PM This report has been signed electronically.

## 2022-08-08 NOTE — Patient Instructions (Signed)
YOU HAD AN ENDOSCOPIC PROCEDURE TODAY AT [facility]:   Refer to the procedure report that was given to you for any specific questions about what was found during the examination.  If the procedure report does not answer your questions, please call your gastroenterologist to clarify.  If you requested that your care partner not be given the details of your procedure findings, then the procedure report has been included in a sealed envelope for you to review at your convenience later.  YOU SHOULD EXPECT: Some feelings of bloating in the abdomen. Passage of more gas than usual.  Walking can help get rid of the air that was put into your GI tract during the procedure and reduce the bloating. If you had a lower endoscopy (such as a colonoscopy or flexible sigmoidoscopy) you may notice spotting of blood in your stool or on the toilet paper. If you underwent a bowel prep for your procedure, you may not have a normal bowel movement for a few days.  Please Note:  You might notice some irritation and congestion in your nose or some drainage.  This is from the oxygen used during your procedure.  There is no need for concern and it should clear up in a day or so.  SYMPTOMS TO REPORT IMMEDIATELY:  Following lower endoscopy (colonoscopy or flexible sigmoidoscopy):  Excessive amounts of blood in the stool  Significant tenderness or worsening of abdominal pains  Swelling of the abdomen that is new, acute  Fever of 100F or higher   For urgent or emergent issues, a gastroenterologist can be reached at any hour by calling [phone]. Do not use MyChart messaging for urgent concerns.    DIET:  We do recommend a small meal at first, but then you may proceed to your regular diet.  Drink plenty of fluids but you should avoid alcoholic beverages for 24 hours.  MEDICATIONS: Continue present medications.  FOLLOW UP: Repeat colonoscopy (date not yet determined) for surveillance based on pathology  results.  Please see handouts given to you by your recovery nurse: Polyps.  Thank you for allowing [place] to provide for your healthcare needs today.  ACTIVITY:  You should plan to take it easy for the rest of today and you should NOT DRIVE or use heavy machinery until tomorrow (because of the sedation medicines used during the test).    FOLLOW UP: Our staff will call the number listed on your records the next business day following your procedure.  We will call around 7:15- 8:00 am to check on you and address any questions or concerns that you may have regarding the information given to you following your procedure. If we do not reach you, we will leave a message.     If any biopsies were taken you will be contacted by phone or by letter within the next 1-3 weeks.  Please call us at [phone] if you have not heard about the biopsies in 3 weeks.    SIGNATURES/CONFIDENTIALITY: You and/or your care partner have signed paperwork which will be entered into your electronic medical record.  These signatures attest to the fact that that the information above on your After Visit Summary has been reviewed and is understood.  Full responsibility of the confidentiality of this discharge information lies with you and/or your care-partner.

## 2022-08-08 NOTE — Progress Notes (Signed)
Report to pacu rn. Vss. Care resumed by rn. 

## 2022-08-08 NOTE — Progress Notes (Signed)
VS completed by CW.   Pt's states no medical or surgical changes since previsit or office visit.  

## 2022-08-08 NOTE — Progress Notes (Signed)
Called to room to assist during endoscopic procedure.  Patient ID and intended procedure confirmed with present staff. Received instructions for my participation in the procedure from the performing physician.  

## 2022-08-08 NOTE — Progress Notes (Signed)
[facility] History and Physical   Primary Care Physician:  Mosie Lukes, MD   Reason for Procedure:   Colon cancer screening  Plan:    Screening colonoscopy     HPI: Anthony Reese is a 52 y.o. male undergoing initial average risk screening colonoscopy.  He has no family history of colon cancer (other than grandfather) and no chronic GI symptoms.    Past Medical History:  Diagnosis Date   Allergy    Arthritis    Chicken pox    H/O tobacco use, presenting hazards to health 07/31/2011   Headache, cluster, episodic    hx over a yr   Injury of left shoulder 01/30/2013   Low testosterone    Nasal polyp 11/18/2013   On home oxygen therapy    ONLY uses for cluster headaches   Other and unspecified hyperlipidemia 11/18/2013   Overweight(278.02)    Oxygen deficiency    Preventative health care 11/18/2013   Radiculopathy affecting upper extremity 11/20/2011   Tobacco use disorder 07/31/2011    Past Surgical History:  Procedure Laterality Date   APPENDECTOMY     BICEPS TENDON REPAIR  2012   right   CARPAL TUNNEL RELEASE Bilateral 2013   right   KNEE SURGERY Right 2013   acl   NASAL SINUS SURGERY  2016   NASAL SINUS SURGERY     x2   TOTAL KNEE ARTHROPLASTY Right 02/19/2016   Procedure: RIGHT TOTAL KNEE ARTHROPLASTY;  Surgeon: Dorna Leitz, MD;  Location: [facility];  Service: Orthopedics;  Laterality: Right;   TOTAL KNEE REVISION Right 04/07/2021   Procedure: RIGHT TOTAL KNEE REVISION;  Surgeon: Gaynelle Arabian, MD;  Location: [facility];  Service: Orthopedics;  Laterality: Right;    Prior to Admission medications   Medication Sig Start Date End Date Taking? Authorizing Provider  cetirizine (ZYRTEC) 10 MG tablet Take 10 mg by mouth 2 (two) times daily as needed for allergies.   Yes [provider]  rosuvastatin (CRESTOR) 40 MG tablet Take 1 tablet (40 mg total) by mouth daily. 11/23/20  Yes Mosie Lukes, MD  fluticasone (FLONASE) 50 MCG/ACT nasal spray  Place 2 sprays into both nostrils daily as needed for allergies or rhinitis.    [provider]  testosterone cypionate (DEPOTESTOSTERONE CYPIONATE) 200 MG/ML injection Inject 1 mL (200 mg total) into the muscle every 14 (fourteen) days. 07/19/22   Mosie Lukes, MD    Current Outpatient Medications  Medication Sig Dispense Refill   cetirizine (ZYRTEC) 10 MG tablet Take 10 mg by mouth 2 (two) times daily as needed for allergies.     rosuvastatin (CRESTOR) 40 MG tablet Take 1 tablet (40 mg total) by mouth daily. 90 tablet 1   fluticasone (FLONASE) 50 MCG/ACT nasal spray Place 2 sprays into both nostrils daily as needed for allergies or rhinitis.     testosterone cypionate (DEPOTESTOSTERONE CYPIONATE) 200 MG/ML injection Inject 1 mL (200 mg total) into the muscle every 14 (fourteen) days. 12 mL 1   Current Facility-Administered Medications  Medication Dose Route Frequency Provider Last Rate Last Admin   0.9 %  sodium chloride infusion  500 mL Intravenous Once Daryel November, MD        Allergies as of 08/08/2022 - Review Complete 08/08/2022  Allergen Reaction Noted   Imitrex [sumatriptan] Anaphylaxis 12/17/2012   Verapamil Anaphylaxis 02/10/2021   Penicillins Other (See Comments) 07/29/2011    Family History  Problem Relation Age of Onset   Cancer Mother  leukemia, pancreatic cancer   Cholelithiasis Sister    Cancer Maternal Aunt        lung   Colon polyps Maternal Grandfather    Colon cancer Maternal Grandfather    Cancer Maternal Grandfather        colon    Esophageal cancer Neg Hx    Stomach cancer Neg Hx    Rectal cancer Neg Hx     Social History   Socioeconomic History   Marital status: Married    Spouse name: Not on file   Number of children: Not on file   Years of education: Not on file   Highest education level: Not on file  Occupational History   Not on file  Tobacco Use   Smoking status: Former    Packs/day: 0.25    Years: 3.00    Total  pack years: 0.75    Types: Cigarettes, Cigars    Quit date: 01/31/2012    Years since quitting: 10.5   Smokeless tobacco: Never  Vaping Use   Vaping Use: Never used  Substance and Sexual Activity   Alcohol use: Yes    Alcohol/week: 14.0 standard drinks of alcohol    Types: 14 Cans of beer per week    Comment: 1-2 beer during week and social on weekends   Drug use: No   Sexual activity: Yes    Partners: Female    Comment: lives with wife, works for [employer], no dietary restrictions.  Other Topics Concern   Not on file  Social History Narrative   Not on file   Social Determinants of Health   Financial Resource Strain: Not on file  Food Insecurity: Not on file  Transportation Needs: Not on file  Physical Activity: Not on file  Stress: Not on file  Social Connections: Not on file  Intimate Partner Violence: Not on file    Review of Systems:  All other review of systems negative except as mentioned in the HPI.  Physical Exam: Vital signs BP 129/64   Pulse 75   Temp 98.7 F (37.1 C) (Temporal)   Ht '5\' 8"'$  (1.727 m)   Wt 237 lb 9.6 oz (107.8 kg)   SpO2 98%   BMI 36.13 kg/m   General:   Alert,  Well-developed, well-nourished, pleasant and cooperative in NAD Airway:  Mallampati 1 Lungs:  Clear throughout to auscultation.   Heart:  Regular rate and rhythm; no murmurs, clicks, rubs,  or gallops. Abdomen:  Soft, nontender and nondistended. Normal bowel sounds.   Neuro/Psych:  Normal mood and affect. A and O x 3   Katrisha Segall E. Candis Schatz, MD [facility]

## 2022-08-09 ENCOUNTER — Telehealth: Payer: Self-pay

## 2022-08-09 NOTE — Telephone Encounter (Signed)
No answer on follow up call. Voicemail full.

## 2022-08-09 NOTE — Telephone Encounter (Signed)
Patient is returning follow up call, states he is feeling fine, no symptoms.

## 2022-08-15 NOTE — Progress Notes (Signed)
Anthony Reese,  Two of the three polyps which I removed during your recent procedure were proven to be completely benign but are considered "pre-cancerous" polyps that MAY have grown into cancer if they had not been removed.  The third polyp was not precancerous.  Studies shows that at least 20% of women over age 51 and 30% of men over age 12 have pre-cancerous polyps.  Based on current nationally recognized surveillance guidelines, I recommend that you have a repeat colonoscopy in 7 years.   If you develop any new rectal bleeding, abdominal pain or significant bowel habit changes, please contact me before then.

## 2022-08-22 ENCOUNTER — Encounter: Payer: Self-pay | Admitting: Family Medicine

## 2022-09-05 NOTE — Telephone Encounter (Signed)
Last Testosterone check was 1/21, is this week too soon for a 1 month recheck?

## 2022-09-06 ENCOUNTER — Other Ambulatory Visit: Payer: Self-pay

## 2022-09-06 DIAGNOSIS — E349 Endocrine disorder, unspecified: Secondary | ICD-10-CM

## 2022-11-10 ENCOUNTER — Encounter: Payer: Self-pay | Admitting: Family Medicine

## 2022-11-10 NOTE — Telephone Encounter (Signed)
Do you have any concerns in regard to the pts upcoming procedure?

## 2022-11-11 NOTE — Telephone Encounter (Signed)
Called pt was advised he need appt.  Appointment was made with Anthony Reese.

## 2022-11-14 ENCOUNTER — Ambulatory Visit (HOSPITAL_BASED_OUTPATIENT_CLINIC_OR_DEPARTMENT_OTHER)
Admission: RE | Admit: 2022-11-14 | Discharge: 2022-11-14 | Disposition: A | Discharge status: Home / Self Care | Payer: BC Managed Care – PPO | Source: Ambulatory Visit | Attending: Family | Admitting: Family

## 2022-11-14 ENCOUNTER — Encounter: Payer: Self-pay | Admitting: Family

## 2022-11-14 ENCOUNTER — Ambulatory Visit: Payer: BC Managed Care – PPO | Admitting: Family

## 2022-11-14 VITALS — BP 116/76 | HR 80 | Temp 98.1°F | Resp 16 | Ht 68.0 in | Wt 227.0 lb

## 2022-11-14 DIAGNOSIS — L989 Disorder of the skin and subcutaneous tissue, unspecified: Secondary | ICD-10-CM | POA: Diagnosis not present

## 2022-11-14 DIAGNOSIS — Z01818 Encounter for other preprocedural examination: Secondary | ICD-10-CM | POA: Insufficient documentation

## 2022-11-14 DIAGNOSIS — M1990 Unspecified osteoarthritis, unspecified site: Secondary | ICD-10-CM | POA: Diagnosis not present

## 2022-11-14 DIAGNOSIS — M179 Osteoarthritis of knee, unspecified: Secondary | ICD-10-CM | POA: Insufficient documentation

## 2022-11-14 LAB — COMPREHENSIVE METABOLIC PANEL
ALT: 30 U/L (ref 0–53)
AST: 23 U/L (ref 0–37)
Albumin: 4.4 g/dL (ref 3.5–5.2)
Alkaline Phosphatase: 84 U/L (ref 39–117)
BUN: 14 mg/dL (ref 6–23)
CO2: 26 mEq/L (ref 19–32)
Calcium: 9.7 mg/dL (ref 8.4–10.5)
Chloride: 101 mEq/L (ref 96–112)
Creatinine, Ser: 0.83 mg/dL (ref 0.40–1.50)
GFR: 101.48 mL/min (ref >60.00)
Glucose, Bld: 93 mg/dL (ref 70–99)
Potassium: 4.7 mEq/L (ref 3.5–5.1)
Sodium: 139 mEq/L (ref 135–145)
Total Bilirubin: 0.8 mg/dL (ref 0.2–1.2)
Total Protein: 7.5 g/dL (ref 6.0–8.3)

## 2022-11-14 LAB — CBC WITH DIFFERENTIAL/PLATELET
Basophils Absolute: 0.1 10*3/uL (ref 0.0–0.1)
Basophils Relative: 1.1 % (ref 0.0–3.0)
Eosinophils Absolute: 0.5 10*3/uL (ref 0.0–0.7)
Eosinophils Relative: 7.1 % — ABNORMAL HIGH (ref 0.0–5.0)
HCT: 47 % (ref 39.0–52.0)
Hemoglobin: 16.4 g/dL (ref 13.0–17.0)
Lymphocytes Relative: 25.7 % (ref 12.0–46.0)
Lymphs Abs: 1.9 10*3/uL (ref 0.7–4.0)
MCHC: 34.8 g/dL (ref 30.0–36.0)
MCV: 97.8 fl (ref 78.0–100.0)
Monocytes Absolute: 0.7 10*3/uL (ref 0.1–1.0)
Monocytes Relative: 10.3 % (ref 3.0–12.0)
Neutro Abs: 4 10*3/uL (ref 1.4–7.7)
Neutrophils Relative %: 55.8 % (ref 43.0–77.0)
Platelets: 349 10*3/uL (ref 150.0–400.0)
RBC: 4.81 Mil/uL (ref 4.22–5.81)
RDW: 13.3 % (ref 11.5–15.5)
WBC: 7.3 10*3/uL (ref 4.0–10.5)

## 2022-11-14 LAB — APTT: aPTT: 35 s (ref 25.4–36.8)

## 2022-11-14 NOTE — Assessment & Plan Note (Addendum)
Obtain labs and chest x-ray as ordered. EKG tracing is personally reviewed.  EKG notes NSR.  No acute changes.  Clearance pending review of these results.

## 2022-11-14 NOTE — Assessment & Plan Note (Signed)
Left forearm- will refer to dermatology for further evaluation.

## 2022-11-14 NOTE — Telephone Encounter (Signed)
Pt had appt today.

## 2022-11-14 NOTE — Progress Notes (Signed)
Subjective:   By signing my name below, I, Shehryar Baig, attest that this documentation has been prepared under the direction and in the presence of Sandford Craze, NP. 11/14/2022   Patient ID: Anthony Reese, male    DOB: 03-20-1971, 52 y.o.   MRN: [id number]  Chief Complaint  Patient presents with   Pre-op Exam    Scheduled for left knee replacement and right knee revision on 12-05-22.     HPI Patient is in today for a Pre-op visit.   Left knee replacement: Patient has upcomming left knee replacement procedure and right knee revision procedure by Dr. Lequita Halt.   Darkened skin spots: He reports developing darkened spots on his left forearm arm. They do not cause him pain. He is not following up with a dermatologist at this time.   Acute: He denies fever, unexpected weight change, adenopathy, sinus pain, sore throat, visual disturbance, chest pain, palpitations, leg swelling, cough, shortness of breath, wheezing, nausea, vomiting, diarrhea, constipation, blood in stool, dysuria, frequency, hematuria, new muscle pain, new joint pain, headaches, depression at this time.   Anxiety: He has situational anxiety from his ongoing health issues.   Social history: He does not use drugs. He does not drink. He occasionally smokes a cigar. Otherwise he does not use vaping products.    Past Medical History:  Diagnosis Date   Allergy    Arthritis    Chicken pox    H/O tobacco use, presenting hazards to health 07/31/2011   Headache, cluster, episodic    hx over a yr   Injury of left shoulder 01/30/2013   Low testosterone    Nasal polyp 11/18/2013   On home oxygen therapy    ONLY uses for cluster headaches   Other and unspecified hyperlipidemia 11/18/2013   Overweight(278.02)    Oxygen deficiency    Preventative health care 11/18/2013   Radiculopathy affecting upper extremity 11/20/2011   Tobacco use disorder 07/31/2011    Past Surgical History:  Procedure Laterality Date    APPENDECTOMY     BICEPS TENDON REPAIR  2012   right   CARPAL TUNNEL RELEASE Bilateral 2013   right   KNEE SURGERY Right 2013   acl   NASAL SINUS SURGERY  2016   NASAL SINUS SURGERY     x2   TOTAL KNEE ARTHROPLASTY Right 02/19/2016   Procedure: RIGHT TOTAL KNEE ARTHROPLASTY;  Surgeon: Jodi Geralds, MD;  Location: [facility];  Service: Orthopedics;  Laterality: Right;   TOTAL KNEE REVISION Right 04/07/2021   Procedure: RIGHT TOTAL KNEE REVISION;  Surgeon: Ollen Gross, MD;  Location: [facility];  Service: Orthopedics;  Laterality: Right;    Family History  Problem Relation Age of Onset   Cancer Mother        leukemia, pancreatic cancer   Cholelithiasis Sister    Cancer Maternal Aunt        lung   Colon polyps Maternal Grandfather    Colon cancer Maternal Grandfather    Cancer Maternal Grandfather        colon    Esophageal cancer Neg Hx    Stomach cancer Neg Hx    Rectal cancer Neg Hx     Social History   Socioeconomic History   Marital status: Married    Spouse name: Not on file   Number of children: Not on file   Years of education: Not on file   Highest education level: Not on file  Occupational History   Not on file  Tobacco Use   Smoking status: Some Days    Packs/day: 0.25    Years: 3.00    Additional pack years: 0.00    Total pack years: 0.75    Types: Cigarettes, Cigars    Last attempt to quit: 01/31/2012    Years since quitting: 10.7   Smokeless tobacco: Never   Tobacco comments:    No cigarettes, occasional cigar  Vaping Use   Vaping Use: Never used  Substance and Sexual Activity   Alcohol use: Yes    Alcohol/week: 14.0 standard drinks of alcohol    Types: 14 Cans of beer per week    Comment: 1-2 beer during week and social on weekends   Drug use: No   Sexual activity: Yes    Partners: Female    Comment: lives with wife, works for [employer], no dietary restrictions.  Other Topics Concern   Not on file  Social History Narrative    Not on file   Social Determinants of Health   Financial Resource Strain: Not on file  Food Insecurity: Not on file  Transportation Needs: Not on file  Physical Activity: Not on file  Stress: Not on file  Social Connections: Not on file  Intimate Partner Violence: Not on file    Outpatient Medications Prior to Visit  Medication Sig Dispense Refill   cetirizine (ZYRTEC) 10 MG tablet Take 10 mg by mouth 2 (two) times daily as needed for allergies.     fluticasone (FLONASE) 50 MCG/ACT nasal spray Place 2 sprays into both nostrils daily as needed for allergies or rhinitis.     rosuvastatin (CRESTOR) 40 MG tablet Take 1 tablet (40 mg total) by mouth daily. 90 tablet 1   testosterone cypionate (DEPOTESTOSTERONE CYPIONATE) 200 MG/ML injection Inject 1 mL (200 mg total) into the muscle every 14 (fourteen) days. 12 mL 1   Facility-Administered Medications Prior to Visit  Medication Dose Route Frequency Provider Last Rate Last Admin   0.9 %  sodium chloride infusion  500 mL Intravenous Once Jenel Lucks, MD        Allergies  Allergen Reactions   Imitrex [Sumatriptan] Anaphylaxis    Swelling around eyes and shortness of breath   Verapamil Anaphylaxis    Swelling around eyes and shortness of breath   Penicillins Other (See Comments)    Unknown reaction as a child.    Review of Systems  Constitutional:  Negative for fever.       (-)unexpected weight change (-)Adenopathy  HENT:  Negative for congestion, sinus pain and sore throat.   Eyes:        (-)Visual disturbance  Respiratory:  Negative for cough, shortness of breath and wheezing.   Cardiovascular:  Negative for chest pain, palpitations and leg swelling.  Gastrointestinal:  Negative for blood in stool, constipation, diarrhea, nausea and vomiting.  Genitourinary:  Negative for dysuria, frequency and hematuria.  Musculoskeletal:        (-)new muscle pain (-)new joint pain  Skin:        (+)darkened skin spots on left  forearm  Neurological:  Negative for dizziness and headaches.  Psychiatric/Behavioral:  Negative for depression. The patient is nervous/anxious (situational anxiety).        Objective:    Physical Exam Constitutional:      General: He is not in acute distress.    Appearance: Normal appearance. He is not ill-appearing.  HENT:     Head: Normocephalic and atraumatic.     Right  Ear: Tympanic membrane, ear canal and external ear normal.     Left Ear: Tympanic membrane, ear canal and external ear normal.  Eyes:     Extraocular Movements: Extraocular movements intact.     Right eye: Normal extraocular motion and no nystagmus.     Left eye: Normal extraocular motion and no nystagmus.     Pupils: Pupils are equal, round, and reactive to light.  Cardiovascular:     Rate and Rhythm: Normal rate and regular rhythm.     Heart sounds: Normal heart sounds. No murmur heard.    No gallop.  Pulmonary:     Effort: Pulmonary effort is normal. No respiratory distress.     Breath sounds: Normal breath sounds. No wheezing or rales.  Musculoskeletal:     Comments: 5/5 strength in both upper and lower extremities  Skin:    General: Skin is warm and dry.     Comments: Dry skin lesion on left forearm  Neurological:     Mental Status: He is alert and oriented to person, place, and time.  Psychiatric:        Judgment: Judgment normal.     BP 116/76 (BP Location: Right Arm, Patient Position: Sitting, Cuff Size: Large)   Pulse 80   Temp 98.1 F (36.7 C) (Oral)   Resp 16   Ht 5\' 8"  (1.727 m)   Wt 227 lb (103 kg)   SpO2 99%   BMI 34.52 kg/m  Wt Readings from Last 3 Encounters:  11/14/22 227 lb (103 kg)  08/08/22 237 lb 9.6 oz (107.8 kg)  07/25/22 237 lb 9.6 oz (107.8 kg)       Assessment & Plan:  Preoperative examination Assessment & Plan: Obtain labs and chest x-ray as ordered. EKG tracing is personally reviewed.  EKG notes NSR.  No acute changes.  Clearance pending review of these  results.   Orders: -     EKG 12-Lead -     Comprehensive metabolic panel -     CBC with Differential/Platelet -     Urine Culture -     Protime-INR; Future -     APTT -     DG Chest 2 View; Future  Skin lesion Assessment & Plan: Left forearm- will refer to dermatology for further evaluation.   Orders: -     Ambulatory referral to Dermatology  Osteoarthritis, unspecified osteoarthritis type, unspecified site    I, Lemont Fillers, NP, personally preformed the services described in this documentation.  All medical record entries made by the scribe were at my direction and in my presence.  I have reviewed the chart and discharge instructions (if applicable) and agree that the record reflects my personal performance and is accurate and complete. 11/14/2022   I,Shehryar Baig,acting as a scribe for Lemont Fillers, NP.,have documented all relevant documentation on the behalf of Lemont Fillers, NP,as directed by  Lemont Fillers, NP while in the presence of Lemont Fillers, NP.   Lemont Fillers, NP

## 2022-11-15 ENCOUNTER — Other Ambulatory Visit (INDEPENDENT_AMBULATORY_CARE_PROVIDER_SITE_OTHER): Payer: BC Managed Care – PPO

## 2022-11-15 ENCOUNTER — Other Ambulatory Visit: Payer: BC Managed Care – PPO

## 2022-11-15 DIAGNOSIS — E349 Endocrine disorder, unspecified: Secondary | ICD-10-CM | POA: Diagnosis not present

## 2022-11-15 DIAGNOSIS — Z01818 Encounter for other preprocedural examination: Secondary | ICD-10-CM

## 2022-11-15 LAB — TESTOSTERONE: Testosterone: 197.02 ng/dL — ABNORMAL LOW (ref 300.00–890.00)

## 2022-11-15 LAB — PROTIME-INR
INR: 1.1 ratio — ABNORMAL HIGH (ref 0.8–1.0)
Prothrombin Time: 11.3 s (ref 9.6–13.1)

## 2022-11-15 LAB — URINE CULTURE
MICRO NUMBER:: 14947463
Result:: NO GROWTH
SPECIMEN QUALITY:: ADEQUATE

## 2022-11-15 NOTE — Progress Notes (Signed)
No charge. Pt here for repeat lab.

## 2022-11-15 NOTE — H&P (Cosign Needed Addendum)
TOTAL KNEE ADMISSION H&P  Patient is being admitted for left total knee arthroplasty; right knee polyethylene revision  Subjective:  Chief Complaint: Left knee pain; right TKA instability  HPI: Anthony Reese, 52 y.o. male presents for pre-operative visit in preparation for their left total knee arthroplasty and polyethylene revision right knee which is scheduled on 12/04/2021 with Dr. Lequita Halt at [facility]. The patient has had symptoms in the left knee including pain which has impacted their quality of life and ability to do activities of daily living. The patient currently has a diagnosis of left knee primary osteoarthritis and has failed conservative treatments including activity modification. He additionally has instability in his right total knee with a negative bone scan. The patient has had no previous surgery on the left knee. The patient denies an active infection.  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   Lateral epicondylitis 11/23/2020   Neck pain 11/23/2020   Left knee pain 11/23/2020   Snoring 09/03/2018   Tachycardia 09/03/2018   Right knee pain 03/06/2018   Bee sting reaction 11/23/2017   Primary osteoarthritis of right knee 02/19/2016   Hyperlipidemia 11/18/2013   Allergy 11/18/2013   Nasal polyp 11/18/2013   Preventative health care 11/18/2013   Injury of left shoulder 01/30/2013   Radiculopathy affecting upper extremity 11/20/2011   H/O tobacco use, presenting hazards to health 07/31/2011   Headache, cluster, episodic    Testosterone deficiency    Obesity     Past Medical History:  Diagnosis Date   Allergy    Arthritis    Chicken pox    H/O tobacco use, presenting hazards to health 07/31/2011   Headache, cluster, episodic    hx over a yr    Injury of left shoulder 01/30/2013   Low testosterone    Nasal polyp 11/18/2013   On home oxygen therapy    ONLY uses for cluster headaches   Other and unspecified hyperlipidemia 11/18/2013   Overweight(278.02)    Oxygen deficiency    Preventative health care 11/18/2013   Radiculopathy affecting upper extremity 11/20/2011   Tobacco use disorder 07/31/2011    Past Surgical History:  Procedure Laterality Date   APPENDECTOMY     BICEPS TENDON REPAIR  2012   right   CARPAL TUNNEL RELEASE Bilateral 2013   right   KNEE SURGERY Right 2013   acl   NASAL SINUS SURGERY  2016   NASAL SINUS SURGERY     x2   TOTAL KNEE ARTHROPLASTY Right 02/19/2016   Procedure: RIGHT TOTAL KNEE ARTHROPLASTY;  Surgeon: Jodi Geralds, MD;  Location: [facility] OR;  Service: Orthopedics;  Laterality: Right;   TOTAL KNEE REVISION Right 04/07/2021   Procedure: RIGHT TOTAL KNEE REVISION;  Surgeon: Ollen Gross, MD;  Location: [facility] ORS;  Service: Orthopedics;  Laterality: Right;    Prior to Admission medications   Medication Sig Start Date End Date Taking? Authorizing Provider  cetirizine (ZYRTEC) 10 MG tablet Take 10 mg by mouth 2 (two) times daily as needed for allergies.    [provider]  fluticasone (FLONASE) 50 MCG/ACT nasal spray Place 2 sprays into both nostrils daily as needed for allergies or rhinitis.    [provider]  rosuvastatin (CRESTOR) 40 MG tablet Take 1 tablet (40 mg total) by mouth daily.  11/23/20   Bradd Canary, MD  testosterone cypionate (DEPOTESTOSTERONE CYPIONATE) 200 MG/ML injection Inject 1 mL (200 mg total) into the muscle every 14 (fourteen) days. 07/19/22   Bradd Canary, MD    Allergies  Allergen Reactions   Imitrex [Sumatriptan] Anaphylaxis    Swelling around eyes and shortness of breath   Verapamil Anaphylaxis    Swelling around eyes and shortness of breath   Penicillins Other (See Comments)    Unknown reaction as a child.    Social History   Socioeconomic  History   Marital status: Married    Spouse name: Not on file   Number of children: Not on file   Years of education: Not on file   Highest education level: Not on file  Occupational History   Not on file  Tobacco Use   Smoking status: Some Days    Packs/day: 0.25    Years: 3.00    Additional pack years: 0.00    Total pack years: 0.75    Types: Cigarettes, Cigars    Last attempt to quit: 01/31/2012    Years since quitting: 10.7   Smokeless tobacco: Never   Tobacco comments:    No cigarettes, occasional cigar  Vaping Use   Vaping Use: Never used  Substance and Sexual Activity   Alcohol use: Yes    Alcohol/week: 14.0 standard drinks of alcohol    Types: 14 Cans of beer per week    Comment: 1-2 beer during week and social on weekends   Drug use: No   Sexual activity: Yes    Partners: Female    Comment: lives with wife, works for [employer], no dietary restrictions.  Other Topics Concern   Not on file  Social History Narrative   Not on file   Social Determinants of Health   Financial Resource Strain: Not on file  Food Insecurity: Not on file  Transportation Needs: Not on file  Physical Activity: Not on file  Stress: Not on file  Social Connections: Not on file  Intimate Partner Violence: Not on file    Tobacco Use: High Risk (11/14/2022)   Patient History    Smoking Tobacco Use: Some Days    Smokeless Tobacco Use: Never    Passive Exposure: Not on file   Social History   Substance and Sexual Activity  Alcohol Use Yes   Alcohol/week: 14.0 standard drinks of alcohol   Types: 14 Cans of beer per week   Comment: 1-2 beer during week and social on weekends    Family History  Problem Relation Age of Onset   Cancer Mother        leukemia, pancreatic cancer   Cholelithiasis Sister    Cancer Maternal Aunt        lung   Colon polyps Maternal Grandfather    Colon cancer Maternal Grandfather    Cancer Maternal Grandfather        colon     Esophageal cancer Neg Hx    Stomach cancer Neg Hx    Rectal cancer Neg Hx     Review of Systems  Constitutional:  Negative for chills and fever.  HENT:  Negative for congestion, sore throat and tinnitus.   Eyes:  Negative for double vision, photophobia and pain.  Respiratory:  Negative for cough, shortness of breath and wheezing.   Cardiovascular:  Negative for chest pain, palpitations and orthopnea.  Gastrointestinal:  Negative for heartburn, nausea and vomiting.  Genitourinary:  Negative for dysuria,  frequency and urgency.  Musculoskeletal:  Positive for joint pain.  Neurological:  Negative for dizziness, weakness and headaches.    Objective:  Physical Exam: Well nourished and well developed.  General: Alert and oriented x3, cooperative and pleasant, no acute distress.  Head: normocephalic, atraumatic, neck supple.  Eyes: EOMI.  Musculoskeletal:  - Left knee shows a large effusion. Range of motion is approximately 0-125. Tender medial and lateral. Some AP laxity in flexion but no varus valgus laxity in extension.  - Right knee has a well-healed scar from previous arthroplasty. No warmth or erythema. Range of motion is 0-125. Fair amount of AP laxity in flexion and varus valgus laxity in extension.  Calves soft and nontender. Motor function intact in LE. Strength 5/5 LE bilaterally. Neuro: Distal pulses 2+. Sensation to light touch intact in LE.  Assessment/Plan:  End stage arthritis, left knee  Instability, right total knee  The patient history, physical examination, clinical judgment of the provider and imaging studies are consistent with end stage degenerative joint disease of the left knee and total knee arthroplasty is deemed medically necessary. The treatment options including medical management, injection therapy arthroscopy and arthroplasty were discussed at length. The risks and benefits of total knee arthroplasty were presented and reviewed. The risks due to aseptic  loosening, infection, stiffness, patella tracking problems, thromboembolic complications and other imponderables were discussed. The patient acknowledged the explanation, agreed to proceed with the plan and consent was signed. Patient is being admitted for inpatient treatment for surgery, pain control, PT, OT, prophylactic antibiotics, VTE prophylaxis, progressive ambulation and ADLs and discharge planning. The patient is planning to be discharged  home .  Therapy Plans: Outpatient therapy at [facility] Silvestre Gunner) Disposition: Home with wife Planned DVT Prophylaxis: Aspirin 81 mg BID DME Needed: None PCP: Sandford Craze, NP (clearance received) TXA: IV Allergies: PCN (childhood), ACEI Anesthesia Concerns: None BMI: 33.2 Last HgbA1c: Not diabetic Pharmacy: [facility])  - Patient was instructed on what medications to stop prior to surgery. - Follow-up visit in 2 weeks with Dr. Lequita Halt - Begin physical therapy following surgery - Pre-operative lab work as pre-surgical testing - Prescriptions will be provided in hospital at time of discharge  Arther Abbott, PA-C Orthopedic Surgery [facility] [place]

## 2022-11-22 NOTE — Patient Instructions (Addendum)
SURGICAL WAITING ROOM VISITATION Patients having surgery or a procedure may have no more than 2 support people in the waiting area - these visitors may rotate in the visitor waiting room.   Due to an increase in RSV and influenza rates and associated hospitalizations, children ages 7 and under may not visit patients in [facility] hospitals. If the patient needs to stay at the hospital during part of their recovery, the visitor guidelines for inpatient rooms apply.  PRE-OP VISITATION  Pre-op nurse will coordinate an appropriate time for 1 support person to accompany the patient in pre-op.  This support person may not rotate.  This visitor will be contacted when the time is appropriate for the visitor to come back in the pre-op area.  Please refer to the [facility] website for the visitor guidelines for Inpatients (after your surgery is over and you are in a regular room).  You are not required to quarantine at this time prior to your surgery. However, you must do this: Hand Hygiene often Do NOT share personal items Notify your provider if you are in close contact with someone who has COVID or you develop fever 100.4 or greater, new onset of sneezing, cough, sore throat, shortness of breath or body aches.  If you test positive for Covid or have been in contact with anyone that has tested positive in the last 10 days please notify you surgeon.    Your procedure is scheduled on:  Monday  December 05, 2022  Report to [facility] Main Entrance: [facility] entrance where the [employer] is available.   Report to admitting at: 12:15 PM  +++++Call this number if you have any questions or problems the morning of surgery [phone]  Do not eat food after Midnight the night prior to your surgery/procedure.  After Midnight you may have the following liquids until   11:45 AM  DAY OF SURGERY  Clear Liquid Diet Water Black Coffee (sugar ok, NO MILK/CREAM OR CREAMERS)  Tea (sugar ok, NO  MILK/CREAM OR CREAMERS) regular and decaf                             Plain Jell-O  with no fruit (NO RED)                                           Fruit ices (not with fruit pulp, NO RED)                                     Popsicles (NO RED)                                                                  Juice: apple, WHITE grape, WHITE cranberry Sports drinks like Gatorade or Powerade (NO RED)                   The day of surgery:  Drink ONE (1) Pre-Surgery Clear Ensure  at  11:45 am    the morning of surgery. Drink in  one sitting. Do not sip.  This drink was given to you during your hospital pre-op appointment visit. Nothing else to drink after completing the Pre-Surgery Clear Ensure : No candy, chewing gum or throat lozenges.    FOLLOW  ANY ADDITIONAL PRE OP INSTRUCTIONS YOU RECEIVED FROM YOUR SURGEON'S OFFICE!!!   Oral Hygiene is also important to reduce your risk of infection.        Remember - BRUSH YOUR TEETH THE MORNING OF SURGERY WITH YOUR REGULAR TOOTHPASTE  Do NOT smoke after Midnight the night before surgery.  Take ONLY these medicines the morning of surgery with A SIP OF WATER: cetirizine (Zyrtec).  You may use your Flonase nasal spray if needed.    You may not have any metal on your body including jewelry, and body piercing  Do not wear  powders, cologne, or deodorant  Men may shave face and neck.  Contacts, Hearing Aids, dentures or bridgework may not be worn into surgery. DENTURES WILL BE REMOVED PRIOR TO SURGERY PLEASE DO NOT APPLY "Poly grip" OR ADHESIVES!!!  You may bring a small overnight bag with you on the day of surgery, only pack items that are not valuable.  IS NOT RESPONSIBLE   FOR VALUABLES THAT ARE LOST OR STOLEN.   Do not bring your home medications to the hospital. The Pharmacy will dispense medications listed on your medication list to you during your admission in the Hospital.  Special Instructions: Bring a copy of your healthcare  power of attorney and living will documents the day of surgery, if you wish to have them scanned into your [employer] Medical Records- EPIC  Please read over the following fact sheets you were given: IF YOU HAVE QUESTIONS ABOUT YOUR PRE-OP INSTRUCTIONS, PLEASE CALL [phone].     Pre-operative 5 CHG Bath Instructions   You can play a key role in reducing the risk of infection after surgery. Your skin needs to be as free of germs as possible. You can reduce the number of germs on your skin by washing with CHG (chlorhexidine gluconate) soap before surgery. CHG is an antiseptic soap that kills germs and continues to kill germs even after washing.   DO NOT use if you have an allergy to chlorhexidine/CHG or antibacterial soaps. If your skin becomes reddened or irritated, stop using the CHG and notify one of our RNs at [phone]  Please shower with the CHG soap starting 4 days before surgery using the following schedule: STARTING THURSDAY Dec 01, 2022  Please keep in mind the following:  DO NOT shave, including legs and underarms, starting the day of your first shower.   You may shave your face at any point before/day of surgery.   Place clean sheets on your bed the day you start using CHG soap. Use a clean washcloth (not used since being washed) for each shower. DO NOT sleep with pets once you start using the CHG.   CHG Shower Instructions:  If you choose to wash your hair and private area, wash first with your normal shampoo/soap.  After you use shampoo/soap, rinse your hair and body thoroughly to remove shampoo/soap residue.  Turn the water OFF and apply about 3 tablespoons (45 ml) of CHG soap to a CLEAN washcloth.  Apply CHG soap ONLY FROM YOUR NECK DOWN TO YOUR TOES (washing for 3-5 minutes)  DO NOT  use CHG soap on face, private areas, open wounds, or sores.  Pay special attention to the area where your surgery is being performed.  If you are having back surgery, having someone wash your back for you may be helpful.  Wait 2 minutes after CHG soap is applied, then you may rinse off the CHG soap.  Pat dry with a clean towel  Put on clean clothes/pajamas   If you choose to wear lotion, please use ONLY the CHG-compatible lotions on the back of this paper.     Additional instructions for the day of surgery: DO NOT APPLY any lotions, deodorants, cologne, or perfumes.   Put on clean/comfortable clothes.  Brush your teeth.  Ask your nurse before applying any prescription medications to the skin.      CHG Compatible Lotions   Aveeno Moisturizing lotion  Cetaphil Moisturizing Cream  Cetaphil Moisturizing Lotion  Clairol Herbal Essence Moisturizing Lotion, Dry Skin  Clairol Herbal Essence Moisturizing Lotion, Extra Dry Skin  Clairol Herbal Essence Moisturizing Lotion, Normal Skin  Curel Age Defying Therapeutic Moisturizing Lotion with Alpha Hydroxy  Curel Extreme Care Body Lotion  Curel Soothing Hands Moisturizing Hand Lotion  Curel Therapeutic Moisturizing Cream, Fragrance-Free  Curel Therapeutic Moisturizing Lotion, Fragrance-Free  Curel Therapeutic Moisturizing Lotion, Original Formula  Eucerin Daily Replenishing Lotion  Eucerin Dry Skin Therapy Plus Alpha Hydroxy Crme  Eucerin Dry Skin Therapy Plus Alpha Hydroxy Lotion  Eucerin Original Crme  Eucerin Original Lotion  Eucerin Plus Crme Eucerin Plus Lotion  Eucerin TriLipid Replenishing Lotion  Keri Anti-Bacterial Hand Lotion  Keri Deep Conditioning Original Lotion Dry Skin Formula Softly Scented  Keri Deep Conditioning Original Lotion, Fragrance Free Sensitive Skin Formula  Keri Lotion Fast Absorbing Fragrance Free Sensitive Skin Formula  Keri Lotion Fast Absorbing Softly Scented Dry Skin Formula  Keri Original Lotion   Keri Skin Renewal Lotion Keri Silky Smooth Lotion  Keri Silky Smooth Sensitive Skin Lotion  Nivea Body Creamy Conditioning Oil  Nivea Body Extra Enriched Lotion  Nivea Body Original Lotion  Nivea Body Sheer Moisturizing Lotion Nivea Crme  Nivea Skin Firming Lotion  NutraDerm 30 Skin Lotion  NutraDerm Skin Lotion  NutraDerm Therapeutic Skin Cream  NutraDerm Therapeutic Skin Lotion  ProShield Protective Hand Cream  Provon moisturizing lotion ON THE DAY OF SURGERY : Do not apply any lotions/deodorants the morning of surgery.  Please wear clean clothes to the hospital/surgery center.    FAILURE TO FOLLOW THESE INSTRUCTIONS MAY RESULT IN THE CANCELLATION OF YOUR SURGERY  PATIENT SIGNATURE_________________________________  NURSE SIGNATURE__________________________________  ________________________________________________________________________        Anthony Reese    An incentive spirometer  is a tool that can help keep your lungs clear and active. This tool measures how well you are filling your lungs with each breath. Taking long deep breaths may help reverse or decrease the chance of developing breathing (pulmonary) problems (especially infection) following: A long period of time when you are unable to move or be active. BEFORE THE PROCEDURE  If the spirometer includes an indicator to show your best effort, your nurse or respiratory therapist will set it to a desired goal. If possible, sit up straight or lean slightly forward. Try not to slouch. Hold the incentive spirometer in an upright position. INSTRUCTIONS FOR USE  Sit on the edge of your bed if possible, or sit up as far as you can in bed or on a chair. Hold the incentive spirometer in an upright position. Breathe out normally. Place the mouthpiece in your mouth and seal your lips tightly around it. Breathe in slowly and as deeply as possible, raising the piston or the ball toward the top of the column. Hold  your breath for 3-5 seconds or for as long as possible. Allow the piston or ball to fall to the bottom of the column. Remove the mouthpiece from your mouth and breathe out normally. Rest for a few seconds and repeat Steps 1 through 7 at least 10 times every 1-2 hours when you are awake. Take your time and take a few normal breaths between deep breaths. The spirometer may include an indicator to show your best effort. Use the indicator as a goal to work toward during each repetition. After each set of 10 deep breaths, practice coughing to be sure your lungs are clear. If you have an incision (the cut made at the time of surgery), support your incision when coughing by placing a pillow or rolled up towels firmly against it. Once you are able to get out of bed, walk around indoors and cough well. You may stop using the incentive spirometer when instructed by your caregiver.  RISKS AND COMPLICATIONS Take your time so you do not get dizzy or light-headed. If you are in pain, you may need to take or ask for pain medication before doing incentive spirometry. It is harder to take a deep breath if you are having pain. AFTER USE Rest and breathe slowly and easily. It can be helpful to keep track of a log of your progress. Your caregiver can provide you with a simple table to help with this. If you are using the spirometer at home, follow these instructions: SEEK MEDICAL CARE IF:  You are having difficultly using the spirometer. You have trouble using the spirometer as often as instructed. Your pain medication is not giving enough relief while using the spirometer. You develop fever of 100.5 F (38.1 C) or higher.                                                                                                    SEEK IMMEDIATE MEDICAL CARE IF:  You cough up bloody sputum that had not been present before. You develop fever of 102 F (  38.9 C) or greater. You develop worsening pain at or near the incision  site. MAKE SURE YOU:  Understand these instructions. Will watch your condition. Will get help right away if you are not doing well or get worse. Document Released: 10/31/2006 Document Revised: 09/12/2011 Document Reviewed: 01/01/2007 [facility] Patient Information 2014 [place], [place].   WHAT IS A BLOOD TRANSFUSION? Blood Transfusion Information  A transfusion is the replacement of blood or some of its parts. Blood is made up of multiple cells which provide different functions. Red blood cells carry oxygen and are used for blood loss replacement. White blood cells fight against infection. Platelets control bleeding. Plasma helps clot blood. Other blood products are available for specialized needs, such as hemophilia or other clotting disorders. BEFORE THE TRANSFUSION  Who gives blood for transfusions?  Healthy volunteers who are fully evaluated to make sure their blood is safe. This is blood bank blood. Transfusion therapy is the safest it has ever been in the practice of medicine. Before blood is taken from a donor, a complete history is taken to make sure that person has no history of diseases nor engages in risky social behavior (examples are intravenous drug use or sexual activity with multiple partners). The donor's travel history is screened to minimize risk of transmitting infections, such as malaria. The donated blood is tested for signs of infectious diseases, such as HIV and hepatitis. The blood is then tested to be sure it is compatible with you in order to minimize the chance of a transfusion reaction. If you or a relative donates blood, this is often done in anticipation of surgery and is not appropriate for emergency situations. It takes many days to process the donated blood. RISKS AND COMPLICATIONS Although transfusion therapy is very safe and saves many lives, the main dangers of transfusion include:  Getting an infectious disease. Developing a transfusion reaction. This is an  allergic reaction to something in the blood you were given. Every precaution is taken to prevent this. The decision to have a blood transfusion has been considered carefully by your caregiver before blood is given. Blood is not given unless the benefits outweigh the risks. AFTER THE TRANSFUSION Right after receiving a blood transfusion, you will usually feel much better and more energetic. This is especially true if your red blood cells have gotten low (anemic). The transfusion raises the level of the red blood cells which carry oxygen, and this usually causes an energy increase. The nurse administering the transfusion will monitor you carefully for complications. HOME CARE INSTRUCTIONS  No special instructions are needed after a transfusion. You may find your energy is better. Speak with your caregiver about any limitations on activity for underlying diseases you may have. SEEK MEDICAL CARE IF:  Your condition is not improving after your transfusion. You develop redness or irritation at the intravenous (IV) site. SEEK IMMEDIATE MEDICAL CARE IF:  Any of the following symptoms occur over the next 12 hours: Shaking chills. You have a temperature by mouth above 102 F (38.9 C), not controlled by medicine. Chest, back, or muscle pain. People around you feel you are not acting correctly or are confused. Shortness of breath or difficulty breathing. Dizziness and fainting. You get a rash or develop hives. You have a decrease in urine output. Your urine turns a dark color or changes to pink, red, or brown. Any of the following symptoms occur over the next 10 days: You have a temperature by mouth above 102 F (38.9 C), not  controlled by medicine. Shortness of breath. Weakness after normal activity. The white part of the eye turns yellow (jaundice). You have a decrease in the amount of urine or are urinating less often. Your urine turns a dark color or changes to pink, red, or brown. Document  Released: 06/17/2000 Document Revised: 09/12/2011 Document Reviewed: 02/04/2008 [facility] Patient Information 2014 [place], [place].    ______________________________________________________________________

## 2022-11-22 NOTE — Progress Notes (Signed)
COVID Vaccine received:  []  No [x]  Yes Date of any COVID positive Test in last 90 days:  PCP - Danise Edge, MD & Sandford Craze, NP  Clearance on Chart and in 11-14-2022 Epic note Cardiologist -  none  Chest x-ray - 11-14-2022 EKG -  11-14-2022  Epic Stress Test -  ECHO -  Cardiac Cath -   PCR screen: [x]  Ordered & Completed           []   No Order but Needs PROFEND           []   N/A for this surgery  Surgery Plan:  []  Ambulatory                            []  Outpatient in bed                            [x]  Admit  Anesthesia:    []  General  []  Spinal                           [x]   Choice []   MAC  Pacemaker / ICD device [x]  No []  Yes   Spinal Cord Stimulator:[x]  No []  Yes       History of Sleep Apnea? [x]  No []  Yes   CPAP used?- [x]  No []  Yes    Does the patient monitor blood sugar?          []  No []  Yes  [x]  N/A  Patient has: [x]  NO Hx DM   []  Pre-DM                 []  DM1  []   DM2 Does patient have a [employer] or Dexacom? []  No []  Yes   Fasting Blood Sugar Ranges-  Checks Blood Sugar _____ times a day  Blood Thinner / Instructions:  none Aspirin Instructions:  none  ERAS Protocol Ordered: []  No  [x]  Yes PRE-SURGERY [x]  ENSURE  []  G2  Patient is to be NPO after:11:45 am   Patient was given the 5 CHG shower / bath instructions for THA / TKA / Total or Reverse Shoulder arthroplasty surgery along with 2 bottles of the CHG soap. Patient will start this on:  Thursday  12-01-2022.  All questions were asked and answered, Patient voiced understanding of this process.   Activity level: Patient is able / unable to climb a flight of stairs without difficulty; []  No CP  []  No SOB, but would have ___   Patient can / can not perform ADLs without assistance.   Anesthesia review: Tachycardia, some days cigar smoker, anxiety, Home O2 - when he has Cluster HAs?  Patient denies shortness of breath, fever, cough and chest pain at PAT appointment.  Patient verbalized  understanding and agreement to the Pre-Surgical Instructions that were given to them at this PAT appointment. Patient was also educated of the need to review these PAT instructions again prior to his surgery.I reviewed the appropriate phone numbers to call if they have any and questions or concerns.

## 2022-11-23 ENCOUNTER — Encounter (HOSPITAL_COMMUNITY): Payer: Self-pay

## 2022-11-23 ENCOUNTER — Other Ambulatory Visit: Payer: Self-pay

## 2022-11-23 ENCOUNTER — Encounter (HOSPITAL_COMMUNITY)
Admission: RE | Admit: 2022-11-23 | Discharge: 2022-11-23 | Disposition: A | Discharge status: Home / Self Care | Payer: BC Managed Care – PPO | Source: Ambulatory Visit | Attending: Orthopedic Surgery | Admitting: Orthopedic Surgery

## 2022-11-23 VITALS — BP 106/72 | HR 80 | Temp 98.0°F | Resp 16 | Ht 68.0 in | Wt 226.0 lb

## 2022-11-23 DIAGNOSIS — Z01818 Encounter for other preprocedural examination: Secondary | ICD-10-CM

## 2022-11-23 DIAGNOSIS — Z01812 Encounter for preprocedural laboratory examination: Secondary | ICD-10-CM | POA: Insufficient documentation

## 2022-11-23 HISTORY — DX: Cardiac arrhythmia, unspecified: I49.9

## 2022-11-23 HISTORY — DX: Anxiety disorder, unspecified: F41.9

## 2022-11-23 LAB — SURGICAL PCR SCREEN
MRSA, PCR: NEGATIVE
Staphylococcus aureus: NEGATIVE

## 2022-11-23 LAB — TYPE AND SCREEN
ABO/RH(D): A POS
Antibody Screen: NEGATIVE

## 2022-12-04 NOTE — Anesthesia Preprocedure Evaluation (Signed)
Anesthesia Evaluation  Patient identified by MRN, date of birth, ID band Patient awake    Reviewed: Allergy & Precautions, NPO status , Patient's Chart, lab work & pertinent test results  History of Anesthesia Complications Negative for: history of anesthetic complications  Airway Mallampati: II  TM Distance: >3 FB Neck ROM: Full    Dental no notable dental hx. (+) Teeth Intact, Dental Advisory Given   Pulmonary neg shortness of breath, neg sleep apnea, neg COPD, neg recent URI, Current Smoker and Patient abstained from smoking., former smoker   Pulmonary exam normal breath sounds clear to auscultation       Cardiovascular Exercise Tolerance: Good (-) hypertension(-) angina (-) Past MI, (-) Cardiac Stents, (-) CABG, (-) Orthopnea and (-) PND negative cardio ROS Normal cardiovascular exam Rhythm:Regular Rate:Normal     Neuro/Psych  Headaches, neg Seizures  Neuromuscular disease (radiculopathy of upper extremity)    GI/Hepatic negative GI ROS, Neg liver ROS,neg GERD  ,,  Endo/Other  negative endocrine ROSneg diabetes  Low testosterone  Renal/GU Lab Results      Component                Value               Date                      CREATININE               0.83                11/14/2022                      K                        4.7                 11/14/2022                   Musculoskeletal  (+) Arthritis , Osteoarthritis,    Abdominal  (+) + obese  Peds  Hematology Lab Results      Component                Value               Date                             HGB                      16.4                11/14/2022                HCT                      47.0                11/14/2022                        PLT                      349.0               11/14/2022  Anesthesia Other Findings All: imitrex verapamil, PCNs  HLD  Reproductive/Obstetrics                              Anesthesia Physical Anesthesia Plan  ASA: 2  Anesthesia Plan: Regional and Combined Spinal and Epidural   Post-op Pain Management:  Regional for Post-op pain and Regional block* and Minimal or no pain anticipated   Induction:   PONV Risk Score and Plan: 0 and Treatment may vary due to age or medical condition and Propofol infusion  Airway Management Planned: Natural Airway, Nasal Cannula and Simple Face Mask  Additional Equipment: None  Intra-op Plan:   Post-operative Plan:   Informed Consent: I have reviewed the patients History and Physical, chart, labs and discussed the procedure including the risks, benefits and alternatives for the proposed anesthesia with the patient or authorized representative who has indicated his/her understanding and acceptance.     Dental advisory given  Plan Discussed with:   Anesthesia Plan Comments: (Spinal w L adductor canal block   )        Anesthesia Quick Evaluation

## 2022-12-05 ENCOUNTER — Inpatient Hospital Stay (HOSPITAL_COMMUNITY)
Admission: RE | Admit: 2022-12-05 | Discharge: 2022-12-09 | DRG: 470 | Disposition: A | Discharge status: Home / Self Care | Payer: BC Managed Care – PPO | Source: Ambulatory Visit | Attending: Orthopedic Surgery | Admitting: Orthopedic Surgery

## 2022-12-05 ENCOUNTER — Encounter (HOSPITAL_COMMUNITY): Payer: Self-pay | Admitting: Orthopedic Surgery

## 2022-12-05 ENCOUNTER — Inpatient Hospital Stay (HOSPITAL_COMMUNITY): Payer: BC Managed Care – PPO | Admitting: Anesthesiology

## 2022-12-05 ENCOUNTER — Encounter (HOSPITAL_COMMUNITY)
Admission: RE | Disposition: A | Discharge status: Home / Self Care | Payer: Self-pay | Source: Ambulatory Visit | Attending: Orthopedic Surgery

## 2022-12-05 DIAGNOSIS — M1712 Unilateral primary osteoarthritis, left knee: Principal | ICD-10-CM | POA: Diagnosis present

## 2022-12-05 DIAGNOSIS — Y792 Prosthetic and other implants, materials and accessory orthopedic devices associated with adverse incidents: Secondary | ICD-10-CM | POA: Diagnosis present

## 2022-12-05 DIAGNOSIS — T84022A Instability of internal right knee prosthesis, initial encounter: Secondary | ICD-10-CM | POA: Diagnosis present

## 2022-12-05 DIAGNOSIS — Z79899 Other long term (current) drug therapy: Secondary | ICD-10-CM

## 2022-12-05 DIAGNOSIS — E669 Obesity, unspecified: Secondary | ICD-10-CM | POA: Diagnosis present

## 2022-12-05 DIAGNOSIS — F419 Anxiety disorder, unspecified: Secondary | ICD-10-CM | POA: Diagnosis present

## 2022-12-05 DIAGNOSIS — M179 Osteoarthritis of knee, unspecified: Principal | ICD-10-CM | POA: Diagnosis not present

## 2022-12-05 DIAGNOSIS — Z6834 Body mass index (BMI) 34.0-34.9, adult: Secondary | ICD-10-CM | POA: Diagnosis not present

## 2022-12-05 DIAGNOSIS — Z87891 Personal history of nicotine dependence: Secondary | ICD-10-CM | POA: Diagnosis not present

## 2022-12-05 DIAGNOSIS — E785 Hyperlipidemia, unspecified: Secondary | ICD-10-CM | POA: Diagnosis present

## 2022-12-05 HISTORY — PX: TOTAL KNEE REVISION: SHX996

## 2022-12-05 HISTORY — PX: TOTAL KNEE ARTHROPLASTY: SHX125

## 2022-12-05 SURGERY — ARTHROPLASTY, KNEE, TOTAL
Anesthesia: Regional | Site: Knee | Laterality: Right

## 2022-12-05 MED ORDER — HYDROMORPHONE HCL 1 MG/ML IJ SOLN: 0.2500 mg | INTRAMUSCULAR | Status: DC | PRN

## 2022-12-05 MED ORDER — SODIUM CHLORIDE 0.9 % IV SOLN: INTRAVENOUS | Status: DC

## 2022-12-05 MED ORDER — MIDAZOLAM HCL 2 MG/2ML IJ SOLN
INTRAMUSCULAR | Status: AC
  Filled 2022-12-05: qty 2

## 2022-12-05 MED ORDER — METHOCARBAMOL 500 MG IVPB - SIMPLE MED
500.0000 mg | Freq: Four times a day (QID) | INTRAVENOUS | Status: DC | PRN
  Administered 2022-12-05: 500 mg via INTRAVENOUS

## 2022-12-05 MED ORDER — ORAL CARE MOUTH RINSE: 15.0000 mL | Freq: Once | OROMUCOSAL | Status: AC

## 2022-12-05 MED ORDER — DOCUSATE SODIUM 100 MG PO CAPS
100.0000 mg | ORAL_CAPSULE | Freq: Two times a day (BID) | ORAL | Status: DC
  Administered 2022-12-05 – 2022-12-09 (×8): 100 mg via ORAL
  Filled 2022-12-05 (×8): qty 1

## 2022-12-05 MED ORDER — ONDANSETRON HCL 4 MG PO TABS: 4.0000 mg | ORAL_TABLET | Freq: Four times a day (QID) | ORAL | Status: DC | PRN

## 2022-12-05 MED ORDER — CEFAZOLIN SODIUM-DEXTROSE 2-4 GM/100ML-% IV SOLN
2.0000 g | Freq: Four times a day (QID) | INTRAVENOUS | Status: AC
  Administered 2022-12-05 – 2022-12-06 (×2): 2 g via INTRAVENOUS
  Filled 2022-12-05 (×2): qty 100

## 2022-12-05 MED ORDER — LACTATED RINGERS IV SOLN: INTRAVENOUS | Status: DC

## 2022-12-05 MED ORDER — ONDANSETRON HCL 4 MG/2ML IJ SOLN: 4.0000 mg | Freq: Four times a day (QID) | INTRAMUSCULAR | Status: DC | PRN

## 2022-12-05 MED ORDER — MENTHOL 3 MG MT LOZG: 1.0000 | LOZENGE | OROMUCOSAL | Status: DC | PRN

## 2022-12-05 MED ORDER — MIDAZOLAM HCL 2 MG/2ML IJ SOLN
1.0000 mg | Freq: Once | INTRAMUSCULAR | Status: AC
  Administered 2022-12-05: 2 mg via INTRAVENOUS
  Filled 2022-12-05: qty 2

## 2022-12-05 MED ORDER — ONDANSETRON HCL 4 MG/2ML IJ SOLN
INTRAMUSCULAR | Status: DC | PRN
  Administered 2022-12-05: 4 mg via INTRAVENOUS

## 2022-12-05 MED ORDER — EPHEDRINE 5 MG/ML INJ
INTRAVENOUS | Status: AC
  Filled 2022-12-05: qty 5

## 2022-12-05 MED ORDER — PHENYLEPHRINE HCL (PRESSORS) 10 MG/ML IV SOLN: INTRAVENOUS | Status: DC | PRN

## 2022-12-05 MED ORDER — OXYCODONE HCL 5 MG PO TABS: 5.0000 mg | ORAL_TABLET | Freq: Once | ORAL | Status: DC | PRN

## 2022-12-05 MED ORDER — PROPOFOL 500 MG/50ML IV EMUL
INTRAVENOUS | Status: DC | PRN
  Administered 2022-12-05: 60 mg via INTRAVENOUS

## 2022-12-05 MED ORDER — SODIUM CHLORIDE (PF) 0.9 % IJ SOLN
INTRAMUSCULAR | Status: AC
  Filled 2022-12-05: qty 10

## 2022-12-05 MED ORDER — PHENYLEPHRINE HCL-NACL 20-0.9 MG/250ML-% IV SOLN
INTRAVENOUS | Status: DC | PRN
  Administered 2022-12-05: 50 ug/min via INTRAVENOUS

## 2022-12-05 MED ORDER — PROPOFOL 1000 MG/100ML IV EMUL
INTRAVENOUS | Status: AC
  Filled 2022-12-05: qty 100

## 2022-12-05 MED ORDER — ONDANSETRON HCL 4 MG/2ML IJ SOLN: 4.0000 mg | Freq: Once | INTRAMUSCULAR | Status: DC | PRN

## 2022-12-05 MED ORDER — ACETAMINOPHEN 500 MG PO TABS
1000.0000 mg | ORAL_TABLET | Freq: Four times a day (QID) | ORAL | Status: AC
  Administered 2022-12-05 – 2022-12-06 (×4): 1000 mg via ORAL
  Filled 2022-12-05 (×4): qty 2

## 2022-12-05 MED ORDER — DEXAMETHASONE SODIUM PHOSPHATE 10 MG/ML IJ SOLN
INTRAMUSCULAR | Status: AC
  Filled 2022-12-05: qty 1

## 2022-12-05 MED ORDER — PROPOFOL 500 MG/50ML IV EMUL
INTRAVENOUS | Status: AC
  Filled 2022-12-05: qty 50

## 2022-12-05 MED ORDER — ACETAMINOPHEN 10 MG/ML IV SOLN
1000.0000 mg | Freq: Four times a day (QID) | INTRAVENOUS | Status: DC
  Administered 2022-12-05: 1000 mg via INTRAVENOUS
  Filled 2022-12-05: qty 100

## 2022-12-05 MED ORDER — MIDAZOLAM HCL 5 MG/5ML IJ SOLN
INTRAMUSCULAR | Status: DC | PRN
  Administered 2022-12-05: 2 mg via INTRAVENOUS

## 2022-12-05 MED ORDER — DEXAMETHASONE SODIUM PHOSPHATE 10 MG/ML IJ SOLN
10.0000 mg | Freq: Once | INTRAMUSCULAR | Status: AC
  Administered 2022-12-06: 10 mg via INTRAVENOUS
  Filled 2022-12-05: qty 1

## 2022-12-05 MED ORDER — BUPIVACAINE IN DEXTROSE 0.75-8.25 % IT SOLN
INTRATHECAL | Status: DC | PRN
  Administered 2022-12-05: 15 mg via INTRATHECAL

## 2022-12-05 MED ORDER — EPHEDRINE SULFATE (PRESSORS) 50 MG/ML IJ SOLN
INTRAMUSCULAR | Status: DC | PRN
  Administered 2022-12-05: 10 mg via INTRAVENOUS
  Administered 2022-12-05 (×2): 5 mg via INTRAVENOUS

## 2022-12-05 MED ORDER — LIDOCAINE HCL (PF) 2 % IJ SOLN
INTRAMUSCULAR | Status: DC | PRN
  Administered 2022-12-05: 60 mg via INTRADERMAL

## 2022-12-05 MED ORDER — DIPHENHYDRAMINE HCL 12.5 MG/5ML PO ELIX
12.5000 mg | ORAL_SOLUTION | ORAL | Status: DC | PRN
  Administered 2022-12-06 – 2022-12-07 (×3): 25 mg via ORAL
  Filled 2022-12-05 (×3): qty 10

## 2022-12-05 MED ORDER — ONDANSETRON HCL 4 MG/2ML IJ SOLN
INTRAMUSCULAR | Status: AC
  Filled 2022-12-05: qty 2

## 2022-12-05 MED ORDER — PROPOFOL 10 MG/ML IV BOLUS
INTRAVENOUS | Status: DC | PRN
  Administered 2022-12-05: 90 ug/kg/min via INTRAVENOUS

## 2022-12-05 MED ORDER — LIDOCAINE HCL (PF) 2 % IJ SOLN
INTRAMUSCULAR | Status: AC
  Filled 2022-12-05: qty 5

## 2022-12-05 MED ORDER — BISACODYL 10 MG RE SUPP: 10.0000 mg | Freq: Every day | RECTAL | Status: DC | PRN

## 2022-12-05 MED ORDER — TRANEXAMIC ACID-NACL 1000-0.7 MG/100ML-% IV SOLN
1000.0000 mg | INTRAVENOUS | Status: AC
  Administered 2022-12-05: 1000 mg via INTRAVENOUS
  Filled 2022-12-05: qty 100

## 2022-12-05 MED ORDER — METHOCARBAMOL 500 MG PO TABS
500.0000 mg | ORAL_TABLET | Freq: Four times a day (QID) | ORAL | Status: DC | PRN
  Administered 2022-12-06 – 2022-12-09 (×11): 500 mg via ORAL
  Filled 2022-12-05 (×11): qty 1

## 2022-12-05 MED ORDER — SODIUM CHLORIDE (PF) 0.9 % IJ SOLN
INTRAMUSCULAR | Status: AC
  Filled 2022-12-05: qty 50

## 2022-12-05 MED ORDER — RIVAROXABAN 10 MG PO TABS
10.0000 mg | ORAL_TABLET | Freq: Every day | ORAL | Status: DC
  Administered 2022-12-06 – 2022-12-09 (×4): 10 mg via ORAL
  Filled 2022-12-05 (×4): qty 1

## 2022-12-05 MED ORDER — METHOCARBAMOL 500 MG IVPB - SIMPLE MED
INTRAVENOUS | Status: AC
  Filled 2022-12-05: qty 55

## 2022-12-05 MED ORDER — BUPIVACAINE LIPOSOME 1.3 % IJ SUSP
INTRAMUSCULAR | Status: AC
  Filled 2022-12-05: qty 20

## 2022-12-05 MED ORDER — SODIUM CHLORIDE 0.9 % IR SOLN
Status: DC | PRN
  Administered 2022-12-05: 3000 mL

## 2022-12-05 MED ORDER — METOCLOPRAMIDE HCL 5 MG/ML IJ SOLN: 5.0000 mg | Freq: Three times a day (TID) | INTRAMUSCULAR | Status: DC | PRN

## 2022-12-05 MED ORDER — CHLORHEXIDINE GLUCONATE 0.12 % MT SOLN
15.0000 mL | Freq: Once | OROMUCOSAL | Status: AC
  Administered 2022-12-05: 15 mL via OROMUCOSAL

## 2022-12-05 MED ORDER — FENTANYL CITRATE PF 50 MCG/ML IJ SOSY
50.0000 ug | PREFILLED_SYRINGE | Freq: Once | INTRAMUSCULAR | Status: AC
  Administered 2022-12-05: 100 ug via INTRAVENOUS
  Filled 2022-12-05: qty 2

## 2022-12-05 MED ORDER — BUPIVACAINE LIPOSOME 1.3 % IJ SUSP
INTRAMUSCULAR | Status: DC | PRN
  Administered 2022-12-05: 20 mL

## 2022-12-05 MED ORDER — OXYCODONE HCL 5 MG/5ML PO SOLN: 5.0000 mg | Freq: Once | ORAL | Status: DC | PRN

## 2022-12-05 MED ORDER — PHENYLEPHRINE HCL-NACL 20-0.9 MG/250ML-% IV SOLN
INTRAVENOUS | Status: AC
  Filled 2022-12-05: qty 250

## 2022-12-05 MED ORDER — CEFAZOLIN SODIUM-DEXTROSE 2-4 GM/100ML-% IV SOLN
2.0000 g | INTRAVENOUS | Status: AC
  Administered 2022-12-05: 2 g via INTRAVENOUS
  Filled 2022-12-05: qty 100

## 2022-12-05 MED ORDER — BUPIVACAINE LIPOSOME 1.3 % IJ SUSP: 20.0000 mL | Freq: Once | INTRAMUSCULAR | Status: DC

## 2022-12-05 MED ORDER — MORPHINE SULFATE (PF) 2 MG/ML IV SOLN
1.0000 mg | INTRAVENOUS | Status: DC | PRN
  Administered 2022-12-06 – 2022-12-08 (×5): 2 mg via INTRAVENOUS
  Filled 2022-12-05 (×5): qty 1

## 2022-12-05 MED ORDER — FLEET ENEMA 7-19 GM/118ML RE ENEM: 1.0000 | ENEMA | Freq: Once | RECTAL | Status: DC | PRN

## 2022-12-05 MED ORDER — ACETAMINOPHEN 10 MG/ML IV SOLN: 1000.0000 mg | Freq: Once | INTRAVENOUS | Status: DC | PRN

## 2022-12-05 MED ORDER — POLYETHYLENE GLYCOL 3350 17 G PO PACK
17.0000 g | PACK | Freq: Every day | ORAL | Status: DC | PRN
  Administered 2022-12-07 – 2022-12-09 (×3): 17 g via ORAL
  Filled 2022-12-05 (×3): qty 1

## 2022-12-05 MED ORDER — BUPIVACAINE HCL (PF) 0.25 % IJ SOLN
INTRAMUSCULAR | Status: DC | PRN
  Administered 2022-12-05 (×2): 25 mL via PERINEURAL

## 2022-12-05 MED ORDER — METOCLOPRAMIDE HCL 5 MG PO TABS: 5.0000 mg | ORAL_TABLET | Freq: Three times a day (TID) | ORAL | Status: DC | PRN

## 2022-12-05 MED ORDER — DEXAMETHASONE SODIUM PHOSPHATE 10 MG/ML IJ SOLN
8.0000 mg | Freq: Once | INTRAMUSCULAR | Status: AC
  Administered 2022-12-05: 10 mg via INTRAVENOUS

## 2022-12-05 MED ORDER — TRAMADOL HCL 50 MG PO TABS
50.0000 mg | ORAL_TABLET | Freq: Four times a day (QID) | ORAL | Status: DC | PRN
  Administered 2022-12-06 (×2): 100 mg via ORAL
  Administered 2022-12-06: 50 mg via ORAL
  Administered 2022-12-07 – 2022-12-09 (×7): 100 mg via ORAL
  Filled 2022-12-05 (×11): qty 2

## 2022-12-05 MED ORDER — PHENOL 1.4 % MT LIQD: 1.0000 | OROMUCOSAL | Status: DC | PRN

## 2022-12-05 MED ORDER — FLUTICASONE PROPIONATE 50 MCG/ACT NA SUSP: 2.0000 | Freq: Every day | NASAL | Status: DC | PRN

## 2022-12-05 MED ORDER — POVIDONE-IODINE 10 % EX SWAB
2.0000 | Freq: Once | CUTANEOUS | Status: AC
  Administered 2022-12-05: 2 via TOPICAL

## 2022-12-05 MED ORDER — 0.9 % SODIUM CHLORIDE (POUR BTL) OPTIME
TOPICAL | Status: DC | PRN
  Administered 2022-12-05: 1000 mL

## 2022-12-05 MED ORDER — LORATADINE 10 MG PO TABS
10.0000 mg | ORAL_TABLET | Freq: Every day | ORAL | Status: DC
  Administered 2022-12-06 – 2022-12-09 (×4): 10 mg via ORAL
  Filled 2022-12-05 (×4): qty 1

## 2022-12-05 MED ORDER — OXYCODONE HCL 5 MG PO TABS
5.0000 mg | ORAL_TABLET | ORAL | Status: DC | PRN
  Administered 2022-12-06 – 2022-12-09 (×17): 10 mg via ORAL
  Filled 2022-12-05 (×17): qty 2

## 2022-12-05 MED ORDER — SODIUM CHLORIDE (PF) 0.9 % IJ SOLN
INTRAMUSCULAR | Status: DC | PRN
  Administered 2022-12-05: 60 mL

## 2022-12-05 MED ORDER — GABAPENTIN 300 MG PO CAPS
300.0000 mg | ORAL_CAPSULE | Freq: Three times a day (TID) | ORAL | Status: DC
  Administered 2022-12-05 – 2022-12-09 (×11): 300 mg via ORAL
  Filled 2022-12-05 (×11): qty 1

## 2022-12-05 SURGICAL SUPPLY — 65 items
ADH SKN CLS APL DERMABOND .7 (GAUZE/BANDAGES/DRESSINGS) ×2
ATTUNE PS FEM LT SZ 6 CEM KNEE (Femur) IMPLANT
ATTUNE PSRP INSR SZ6 8 KNEE (Insert) IMPLANT
BAG COUNTER SPONGE SURGICOUNT (BAG) IMPLANT
BAG SPEC THK2 15X12 ZIP CLS (MISCELLANEOUS) ×2
BAG SPNG CNTER NS LX DISP (BAG)
BAG ZIPLOCK 12X15 (MISCELLANEOUS) ×2 IMPLANT
BASE TIBIA ATTUNE KNEE SYS SZ6 (Knees) IMPLANT
BLADE SAG 18X100X1.27 (BLADE) ×2 IMPLANT
BLADE SAW SGTL 11.0X1.19X90.0M (BLADE) ×2 IMPLANT
BLADE SURG SZ10 CARB STEEL (BLADE) IMPLANT
BNDG CMPR 5X62 HK CLSR LF (GAUZE/BANDAGES/DRESSINGS) ×2
BNDG CMPR MED 10X6 ELC LF (GAUZE/BANDAGES/DRESSINGS) ×2
BNDG ELASTIC 6INX 5YD STR LF (GAUZE/BANDAGES/DRESSINGS) ×2 IMPLANT
BNDG ELASTIC 6X10 VLCR STRL LF (GAUZE/BANDAGES/DRESSINGS) IMPLANT
BOWL SMART MIX CTS (DISPOSABLE) ×2 IMPLANT
BSPLAT TIB 6 CMNT ROT PLAT STR (Knees) ×2 IMPLANT
CEMENT HV SMART SET (Cement) ×4 IMPLANT
COVER SURGICAL LIGHT HANDLE (MISCELLANEOUS) ×2 IMPLANT
CUFF TOURN SGL QUICK 34 (TOURNIQUET CUFF) ×2
CUFF TRNQT CYL 34X4.125X (TOURNIQUET CUFF) ×2 IMPLANT
DERMABOND ADVANCED .7 DNX12 (GAUZE/BANDAGES/DRESSINGS) ×2 IMPLANT
DRAPE INCISE IOBAN 66X45 STRL (DRAPES) ×2 IMPLANT
DRAPE U-SHAPE 47X51 STRL (DRAPES) ×2 IMPLANT
DRSG AQUACEL AG ADV 3.5X10 (GAUZE/BANDAGES/DRESSINGS) ×2 IMPLANT
DURAPREP 26ML APPLICATOR (WOUND CARE) ×2 IMPLANT
ELECT REM PT RETURN 15FT ADLT (MISCELLANEOUS) ×2 IMPLANT
GLOVE BIO SURGEON STRL SZ 6.5 (GLOVE) IMPLANT
GLOVE BIO SURGEON STRL SZ7 (GLOVE) IMPLANT
GLOVE BIO SURGEON STRL SZ8 (GLOVE) ×4 IMPLANT
GLOVE BIOGEL PI IND STRL 6.5 (GLOVE) IMPLANT
GLOVE BIOGEL PI IND STRL 7.0 (GLOVE) IMPLANT
GLOVE BIOGEL PI IND STRL 8 (GLOVE) ×2 IMPLANT
GOWN STRL REUS W/ TWL LRG LVL3 (GOWN DISPOSABLE) ×2 IMPLANT
GOWN STRL REUS W/TWL LRG LVL3 (GOWN DISPOSABLE) ×2
HANDPIECE INTERPULSE COAX TIP (DISPOSABLE) ×2
HOLDER FOLEY CATH W/STRAP (MISCELLANEOUS) IMPLANT
IMMOBILIZER KNEE 20 (SOFTGOODS) ×4
IMMOBILIZER KNEE 20 THIGH 36 (SOFTGOODS) ×2 IMPLANT
INSERT TIB CRS ATTUNE SZ6 18 (Insert) IMPLANT
KIT TURNOVER KIT A (KITS) IMPLANT
MANIFOLD NEPTUNE II (INSTRUMENTS) ×2 IMPLANT
NS IRRIG 1000ML POUR BTL (IV SOLUTION) ×2 IMPLANT
PACK TOTAL KNEE CUSTOM (KITS) ×2 IMPLANT
PADDING CAST COTTON 6X4 STRL (CAST SUPPLIES) ×4 IMPLANT
PATELLA MEDIAL ATTUN 35MM KNEE (Knees) IMPLANT
PIN STEINMAN FIXATION KNEE (PIN) IMPLANT
PROTECTOR NERVE ULNAR (MISCELLANEOUS) ×2 IMPLANT
SET HNDPC FAN SPRY TIP SCT (DISPOSABLE) ×2 IMPLANT
SPIKE FLUID TRANSFER (MISCELLANEOUS) ×2 IMPLANT
STRIP CLOSURE SKIN 1/2X4 (GAUZE/BANDAGES/DRESSINGS) ×4 IMPLANT
SUT MNCRL AB 4-0 PS2 18 (SUTURE) ×2 IMPLANT
SUT STRATAFIX 0 PDS 27 VIOLET (SUTURE) ×2
SUT VIC AB 2-0 CT1 27 (SUTURE) ×6
SUT VIC AB 2-0 CT1 TAPERPNT 27 (SUTURE) ×6 IMPLANT
SUTURE STRATFX 0 PDS 27 VIOLET (SUTURE) ×2 IMPLANT
SWAB COLLECTION DEVICE MRSA (MISCELLANEOUS) IMPLANT
SWAB CULTURE ESWAB REG 1ML (MISCELLANEOUS) IMPLANT
TIBIA ATTUNE KNEE SYS BASE SZ6 (Knees) ×2 IMPLANT
TOWER CARTRIDGE SMART MIX (DISPOSABLE) ×2 IMPLANT
TRAY FOLEY MTR SLVR 16FR STAT (SET/KITS/TRAYS/PACK) ×2 IMPLANT
TUBE KAMVAC SUCTION (TUBING) IMPLANT
TUBE SUCTION HIGH CAP CLEAR NV (SUCTIONS) ×2 IMPLANT
WATER STERILE IRR 1000ML POUR (IV SOLUTION) ×4 IMPLANT
WRAP KNEE MAXI GEL POST OP (GAUZE/BANDAGES/DRESSINGS) ×2 IMPLANT

## 2022-12-05 NOTE — Anesthesia Procedure Notes (Signed)
Anesthesia Regional Block: Adductor canal block   Pre-Anesthetic Checklist: , timeout performed,  Correct Patient, Correct Site, Correct Laterality,  Correct Procedure, Correct Position, site marked,  Risks and benefits discussed,  Surgical consent,  Pre-op evaluation,  At surgeon's request and post-op pain management  Laterality: Lower and Right  Prep: chloraprep       Needles:  Injection technique: Single-shot  Needle Type: Echogenic Needle     Needle Length: 9cm  Needle Gauge: 22     Additional Needles:   Procedures:,,,, ultrasound used (permanent image in chart),,    Narrative:  Start time: 12/05/2022 2:36 PM End time: 12/05/2022 2:40 PM Injection made incrementally with aspirations every 5 mL.  Performed by: Personally  Anesthesiologist: Trevor Iha, MD  Additional Notes: Block assessed prior to surgery. Pt tolerated procedure well.

## 2022-12-05 NOTE — Anesthesia Procedure Notes (Signed)
Anesthesia Regional Block: Adductor canal block   Pre-Anesthetic Checklist: , timeout performed,  Correct Patient, Correct Site, Correct Laterality,  Correct Procedure, Correct Position, site marked,  Risks and benefits discussed,  Surgical consent,  Pre-op evaluation,  At surgeon's request and post-op pain management  Laterality: Lower and Left  Prep: chloraprep       Needles:  Injection technique: Single-shot  Needle Type: Echogenic Needle     Needle Length: 9cm  Needle Gauge: 22     Additional Needles:   Procedures:,,,, ultrasound used (permanent image in chart),,    Narrative:  Start time: 12/05/2022 2:42 PM End time: 12/05/2022 2:46 PM Injection made incrementally with aspirations every 5 mL.  Performed by: Personally  Anesthesiologist: Trevor Iha, MD  Additional Notes: Block assessed prior to surgery. Pt tolerated procedure well.

## 2022-12-05 NOTE — Anesthesia Procedure Notes (Signed)
Spinal  Patient location during procedure: OR Start time: 12/05/2022 3:09 PM End time: 12/05/2022 3:16 PM Reason for block: surgical anesthesia Staffing Performed: anesthesiologist  Anesthesiologist: Trevor Iha, MD Performed by: Trevor Iha, MD Authorized by: Trevor Iha, MD   Preanesthetic Checklist Completed: patient identified, IV checked, site marked, risks and benefits discussed, surgical consent, monitors and equipment checked, pre-op evaluation and timeout performed Spinal Block Patient position: sitting Prep: DuraPrep and site prepped and draped Patient monitoring: cardiac monitor, continuous pulse ox, blood pressure and heart rate Approach: midline Location: L3-4 Injection technique: catheter Needle Needle type: Tuohy and Sprotte  Needle gauge: 27 G Needle length: 12.7 cm Needle insertion depth: 8 cm Catheter type: closed end flexible Catheter size: 19 g Catheter at skin depth: 14 cm Assessment Sensory level: T4 Events: CSF return Additional Notes  1 Attempt (s). Pt tolerated procedure well.

## 2022-12-05 NOTE — Transfer of Care (Signed)
Immediate Anesthesia Transfer of Care Note  Patient: Anthony Reese  Procedure(s) Performed: TOTAL KNEE ARTHROPLASTY (Left: Knee) Right knee polyethylene revision (Right: Knee)  Patient Location: PACU  Anesthesia Type:Spinal  Level of Consciousness: awake, alert , and patient cooperative  Airway & Oxygen Therapy: Patient Spontanous Breathing and Patient connected to face mask oxygen  Post-op Assessment: Report given to RN and Post -op Vital signs reviewed and stable  Post vital signs: Reviewed and stable  Last Vitals:  Vitals Value Taken Time  BP 123/83 12/05/22 1755  Temp    Pulse 78 12/05/22 1756  Resp 19 12/05/22 1756  SpO2 100 % 12/05/22 1756  Vitals shown include unvalidated device data.  Last Pain:  Vitals:   12/05/22 1450  TempSrc:   PainSc: 0-No pain         Complications: No notable events documented.

## 2022-12-05 NOTE — Op Note (Unsigned)
NAME: Anthony Reese, Anthony Reese MEDICAL RECORD NO: [id number] ACCOUNT NO: [id number] DATE OF BIRTH: April 20, 1971 FACILITY: [facility] LOCATION: [id number] PHYSICIAN: Gus Rankin. Taler Kushner, MD  Operative Report   DATE OF PROCEDURE: 12/05/2022  PREOPERATIVE DIAGNOSES: 1.  Osteoarthritis, left knee. 2.  Unstable right total knee arthroplasty.  POSTOPERATIVE DIAGNOSES: 1.  Osteoarthritis, left knee. 2.  Unstable right total knee arthroplasty.  PROCEDURE: 1.  Left total knee arthroplasty. 2.  Right knee polyethylene revision.  SURGEON:  Gus Rankin. Tekia Waterbury, MD  ASSISTANT:  Weston Brass, PA-C.  ANESTHESIA:  Spinal and bilateral adductor canal block.  ESTIMATED BLOOD LOSS:  100.  DRAINS:  None.  TOURNIQUET TIME:  On the left knee, 36 minutes at 300 mmHg. On the right knee, 47 minutes at 300 mmHg.  COMPLICATIONS:  None.  CONDITION:  Stable to recovery.  BRIEF CLINICAL NOTE:  After successful administration of bilateral adductor canal block and spinal, a tourniquet was placed high on his bilateral thighs and bilateral lower extremities prepped and draped in the usual sterile fashion.  We decided to do  the left side.  The primary knee first.  The left lower extremity was wrapped in Esmarch, knee flexed and the tourniquet inflated to 300 mmHg.  Midline incision was made with a 10 blade through the subcutaneous tissue to the level of the extensor  mechanism.  Fresh blade was used to make a medial parapatellar arthrotomy.  Soft tissue on the proximal medial tibia subperiosteally elevated to the joint line with a knife and into the semimembranosus bursa with a Cobb elevator.  Soft tissue laterally  was elevated with attention being paid to avoid the patellar tendon on tibial tubercle.  The patella was everted, knee flexed 90 degrees and ACL and PCL were removed.  Drill was used to create a starting hole in the distal femur and the canal was  thoroughly irrigated.  The 5-degree left valgus alignment guide was placed  and the block pinned to remove 9 mm off the distal femur.  Distal femoral resection was made with an oscillating saw.  The tibia subluxed forward and the menisci removed.  Extramedullary tibial alignment guide was placed, referencing proximally at the medial aspect of the tibial tubercle and distally along the second metatarsal axis and tibial crest.  Block is pinned to  remove 2 mm off the more deficient medial side.  Tibial resection was made with an oscillating saw.  Size 6 was the most appropriate tibial component and a proximal tibia was prepared to modular drill and keel punch for the size 6.  Femoral sizing was performed with the sizer and size 6 was most appropriate for the femur.  The rotation was marked off the epicondylar axis and a size 6 cutting block pinned in this rotation.  The anterior, posterior and chamfer cuts were made for the  size 6.  Intercondylar block was placed and that cut made.  Trial size 6 posterior stabilized femur was placed.  An 8 mm posterior stabilized rotating platform insert trial was placed.  With the 8 full extension was achieved with excellent varus, valgus,  and anterior, posterior balance throughout full range of motion.  The patella was then everted, and thickness measured to be 24 mm.  Freehand resection was taken to 14 mm, 35 template is placed, lug holes were drilled.  Trial patella was placed and it  tracks normally.  Osteophytes then removed off the posterior femur with the trial in place.  All trials were removed and the cut bone  surfaces prepared with pulsatile lavage.  Cement was mixed and once ready for implantation a size 6 MBT tibial tray size  6 posterior stabilized Attune femur and the 35 patella were cemented into place and the patella held with a clamp.  Trial 8 mm insert was placed.  Knee held in full extension and all extruded cement removed.  Cement was fully hardened, then the  permanent 8 mm posterior stabilized rotating platform insert was  placed into the tibial tray.  20 mL of Exparel mixed with 60 mL of saline injected to the extensor mechanism subcutaneous tissues, periosteum of the femur and posterior capsule.  Wound was  copiously irrigated with saline solution and the arthrotomy closed with a running 0 Stratafix suture.  Tourniquet was released, total time 36 minutes.  Subcutaneous was closed with interrupted 2-0 Vicryl and subcuticular running 4-0 Monocryl.  The right lower extremity was then addressed.  Right lower extremity was wrapped in Esmarch and tourniquet inflated to 300 mmHg.  Exam under anesthesia showed a moderate amount of varus, valgus, and AP laxity present.  The 10 blade was used to cut the  skin through subcutaneous tissue to the level of the extensor mechanism.  Fresh blade was used to make a medial parapatellar arthrotomy.  There was some fluid in the joint that was sent for Gram stain, culture and sensitivity with aerobic and anaerobic  cultures.  The soft tissue over the proximal medial tibia subperiosteally elevated to the joint line with a knife and into the semimembranosus bursa with a Cobb elevator.  Soft tissue laterally was elevated with attention being paid to avoid the patellar  tendon on tibial tubercle.  I cut all the scar out from under the extensor mechanism, the tissue appeared mildly inflamed, but did not have any other abnormal appearance.  Patella was everted, knee flexed 90 degrees.  He had a 16 mm Attune revision  rotating platform polyethylene in place.  I had to remove some of the polyethylene to get to the pin in the polyethylene.  When I removed the polyethylene I was then able actually to sublux the tibia forward and remove the polyethylene from the tray.  I  did not need to remove the pin first.  This was a 16 mm poly.  I trialed with an 57 and that allowed for full extension with outstanding balance and outstanding stability throughout full range of motion.  We then placed the 18 mm Attune  revision  posterior stabilized rotating platform polyethylene into the tibial tray and reduced this.  There was fantastic stability throughout full range of motion and eliminated all of the instability that a 16 mm insert had.  Wound was copiously irrigated with  saline solution and arthrotomy closed with a running 0 Stratafix suture.  I also had irrigated with Prontosan prior to closure.  Once the tourniquet was released minor bleeding stopped with cautery and subcutaneous closed with interrupted 2-0 Vicryl and  subcuticular running 4-0 Monocryl.  Both incisions cleaned and dried and Dermabond applied.  Bulky sterile dressings were applied, and he was placed into knee immobilizers.  He was awakened and transported to recovery in stable condition.  Note that a surgical assistant is necessary for these procedures to do them in a safe and expeditious manner.  A surgical assistant was necessary for retraction of vital ligaments and neurovascular structures and for proper positioning of the components,  and also necessary for proper positioning of the limb to safely do this operation.  SHY D: 12/05/2022 5:57:18 pm T: 12/05/2022 9:11:00 pm  JOB: [id number]/ [id number]

## 2022-12-05 NOTE — Progress Notes (Signed)
Orthopedic Tech Progress Note Patient Details:  Anthony Reese [birth year] [id number]  CPM Left Knee CPM Left Knee: On Left Knee Flexion (Degrees): 10 Left Knee Extension (Degrees): 40  Post Interventions Patient Tolerated: Well Instructions Provided: Adjustment of device, Care of device  Sherilyn Banker 12/05/2022, 7:32 PM

## 2022-12-05 NOTE — Discharge Instructions (Addendum)
Ollen Gross, MD Total Joint Specialist [facility] [address], [address] [place], [place] [address] [phone]  TOTAL KNEE REPLACEMENT POSTOPERATIVE DIRECTIONS    Knee Rehabilitation, Guidelines Following Surgery  Results after knee surgery are often greatly improved when you follow the exercise, range of motion and muscle strengthening exercises prescribed by your doctor. Safety measures are also important to protect the knee from further injury. If any of these exercises cause you to have increased pain or swelling in your knee joint, decrease the amount until you are comfortable again and slowly increase them. If you have problems or questions, call your caregiver or physical therapist for advice.   BLOOD CLOT PREVENTION Take 81 mg Aspirin two times a day for three weeks following surgery. Then take an 81 mg Aspirin once a day for three weeks. Then discontinue Aspirin. You may resume your vitamins/supplements upon discharge from the hospital. Do not take any NSAIDs (Advil, Aleve, Ibuprofen, Meloxicam, etc.) for 3 weeks, while taking 81mg  Aspirin twice a day.   HOME CARE INSTRUCTIONS  Remove items at home which could result in a fall. This includes throw rugs or furniture in walking pathways.  ICE to the affected knee as much as tolerated. Icing helps control swelling. If the swelling is well controlled you will be more comfortable and rehab easier. Continue to use ice on the knee for pain and swelling from surgery. You may notice swelling that will progress down to the foot and ankle. This is normal after surgery. Elevate the leg when you are not up walking on it.    Continue to use the breathing machine which will help keep your temperature down. It is common for your temperature to cycle up and down following surgery, especially at night when you are not up moving around and exerting yourself. The breathing machine keeps your lungs expanded and your temperature  down. Do not place pillow under the operative knee, focus on keeping the knee straight while resting  DIET You may resume your previous home diet once you are discharged from the hospital.  DRESSING / WOUND CARE / SHOWERING Keep your bulky bandage on for 2 days. On the third post-operative day you may remove the Ace bandage and gauze. There is a waterproof adhesive bandage on your skin which will stay in place until your first follow-up appointment. Once you remove this you will not need to place another bandage You may begin showering 3 days following surgery, but do not submerge the incision under water.  ACTIVITY For the first 5 days, the key is rest and control of pain and swelling Do your home exercises twice a day starting on post-operative day 3. On the days you go to physical therapy, just do the home exercises once that day. You should rest, ice and elevate the leg for 50 minutes out of every hour. Get up and walk/stretch for 10 minutes per hour. After 5 days you can increase your activity slowly as tolerated. Walk with your walker as instructed. Use the walker until you are comfortable transitioning to a cane. Walk with the cane in the opposite hand of the operative leg. You may discontinue the cane once you are comfortable and walking steadily. Avoid periods of inactivity such as sitting longer than an hour when not asleep. This helps prevent blood clots.  You may discontinue the knee immobilizer once you are able to perform a straight leg raise while lying down. You may resume a sexual relationship in  one month or when given the OK by your doctor.  You may return to work once you are cleared by your doctor.  Do not drive a car for 6 weeks or until released by your surgeon.  Do not drive while taking narcotics.  TED HOSE STOCKINGS Wear the elastic stockings on both legs for three weeks following surgery during the day. You may remove them at night for sleeping.  WEIGHT  BEARING Weight bearing as tolerated with assist device (walker, cane, etc) as directed, use it as long as suggested by your surgeon or therapist, typically at least 4-6 weeks.  POSTOPERATIVE CONSTIPATION PROTOCOL Constipation - defined medically as fewer than three stools per week and severe constipation as less than one stool per week.  One of the most common issues patients have following surgery is constipation.  Even if you have a regular bowel pattern at home, your normal regimen is likely to be disrupted due to multiple reasons following surgery.  Combination of anesthesia, postoperative narcotics, change in appetite and fluid intake all can affect your bowels.  In order to avoid complications following surgery, here are some recommendations in order to help you during your recovery period.  Colace (docusate) - Pick up an over-the-counter form of Colace or another stool softener and take twice a day as long as you are requiring postoperative pain medications.  Take with a full glass of water daily.  If you experience loose stools or diarrhea, hold the colace until you stool forms back up. If your symptoms do not get better within 1 week or if they get worse, check with your doctor. Dulcolax (bisacodyl) - Pick up over-the-counter and take as directed by the product packaging as needed to assist with the movement of your bowels.  Take with a full glass of water.  Use this product as needed if not relieved by Colace only.  MiraLax (polyethylene glycol) - Pick up over-the-counter to have on hand. MiraLax is a solution that will increase the amount of water in your bowels to assist with bowel movements.  Take as directed and can mix with a glass of water, juice, soda, coffee, or tea. Take if you go more than two days without a movement. Do not use MiraLax more than once per day. Call your doctor if you are still constipated or irregular after using this medication for 7 days in a row.  If you continue  to have problems with postoperative constipation, please contact the office for further assistance and recommendations.  If you experience "the worst abdominal pain ever" or develop nausea or vomiting, please contact the office immediatly for further recommendations for treatment.  ITCHING If you experience itching with your medications, try taking only a single pain pill, or even half a pain pill at a time.  You can also use Benadryl over the counter for itching or also to help with sleep.   MEDICATIONS See your medication summary on the "After Visit Summary" that the nursing staff will review with you prior to discharge.  You may have some home medications which will be placed on hold until you complete the course of blood thinner medication.  It is important for you to complete the blood thinner medication as prescribed by your surgeon.  Continue your approved medications as instructed at time of discharge.  PRECAUTIONS If you experience chest pain or shortness of breath - call 911 immediately for transfer to the hospital emergency department.  If you develop a fever greater that  101 F, purulent drainage from wound, increased redness or drainage from wound, foul odor from the wound/dressing, or calf pain - CONTACT YOUR SURGEON.                                                   FOLLOW-UP APPOINTMENTS Make sure you keep all of your appointments after your operation with your surgeon and caregivers. You should call the office at the above phone number and make an appointment for approximately two weeks after the date of your surgery or on the date instructed by your surgeon outlined in the "After Visit Summary".  RANGE OF MOTION AND STRENGTHENING EXERCISES  Rehabilitation of the knee is important following a knee injury or an operation. After just a few days of immobilization, the muscles of the thigh which control the knee become weakened and shrink (atrophy). Knee exercises are designed to build up  the tone and strength of the thigh muscles and to improve knee motion. Often times heat used for twenty to thirty minutes before working out will loosen up your tissues and help with improving the range of motion but do not use heat for the first two weeks following surgery. These exercises can be done on a training (exercise) mat, on the floor, on a table or on a bed. Use what ever works the best and is most comfortable for you Knee exercises include:  Leg Lifts - While your knee is still immobilized in a splint or cast, you can do straight leg raises. Lift the leg to 60 degrees, hold for 3 sec, and slowly lower the leg. Repeat 10-20 times 2-3 times daily. Perform this exercise against resistance later as your knee gets better.  Quad and Hamstring Sets - Tighten up the muscle on the front of the thigh (Quad) and hold for 5-10 sec. Repeat this 10-20 times hourly. Hamstring sets are done by pushing the foot backward against an object and holding for 5-10 sec. Repeat as with quad sets.  Leg Slides: Lying on your back, slowly slide your foot toward your buttocks, bending your knee up off the floor (only go as far as is comfortable). Then slowly slide your foot back down until your leg is flat on the floor again. Angel Wings: Lying on your back spread your legs to the side as far apart as you can without causing discomfort.  A rehabilitation program following serious knee injuries can speed recovery and prevent re-injury in the future due to weakened muscles. Contact your doctor or a physical therapist for more information on knee rehabilitation.   POST-OPERATIVE OPIOID TAPER INSTRUCTIONS: It is important to wean off of your opioid medication as soon as possible. If you do not need pain medication after your surgery it is ok to stop day one. Opioids include: Codeine, Hydrocodone(Norco, Vicodin), Oxycodone(Percocet, oxycontin) and hydromorphone amongst others.  Long term and even short term use of opiods can  cause: Increased pain response Dependence Constipation Depression Respiratory depression And more.  Withdrawal symptoms can include Flu like symptoms Nausea, vomiting And more Techniques to manage these symptoms Hydrate well Eat regular healthy meals Stay active Use relaxation techniques(deep breathing, meditating, yoga) Do Not substitute Alcohol to help with tapering If you have been on opioids for less than two weeks and do not have pain than it is ok to stop all together.  Plan to wean off of opioids This plan should start within one week post op of your joint replacement. Maintain the same interval or time between taking each dose and first decrease the dose.  Cut the total daily intake of opioids by one tablet each day Next start to increase the time between doses. The last dose that should be eliminated is the evening dose.   IF YOU ARE TRANSFERRED TO A SKILLED REHAB FACILITY If the patient is transferred to a skilled rehab facility following release from the hospital, a list of the current medications will be sent to the facility for the patient to continue.  When discharged from the skilled rehab facility, please have the facility set up the patient's Home Health Physical Therapy prior to being released. Also, the skilled facility will be responsible for providing the patient with their medications at time of release from the facility to include their pain medication, the muscle relaxants, and their blood thinner medication. If the patient is still at the rehab facility at time of the two week follow up appointment, the skilled rehab facility will also need to assist the patient in arranging follow up appointment in our office and any transportation needs.  MAKE SURE YOU:  Understand these instructions.  Get help right away if you are not doing well or get worse.   DENTAL ANTIBIOTICS:  In most cases prophylactic antibiotics for Dental procdeures after total joint surgery are  not necessary.  Exceptions are as follows:  1. History of prior total joint infection  2. Severely immunocompromised (Organ Transplant, cancer chemotherapy, Rheumatoid biologic meds such as Humera)  3. Poorly controlled diabetes (A1C &gt; 8.0, blood glucose over 200)  If you have one of these conditions, contact your surgeon for an antibiotic prescription, prior to your dental procedure.    Pick up stool softner and laxative for home use following surgery while on pain medications. Do not submerge incision under water. Please use good hand washing techniques while changing dressing each day. May shower starting three days after surgery. Please use a clean towel to pat the incision dry following showers. Continue to use ice for pain and swelling after surgery. Do not use any lotions or creams on the incision until instructed by your surgeon.   Information on my medicine - XARELTO (Rivaroxaban)  This medication education was reviewed with me or my healthcare representative as part of my discharge preparation.  The pharmacist that spoke with me during my hospital stay was:    Why was Xarelto prescribed for you? Xarelto was prescribed for you to reduce the risk of blood clots forming after orthopedic surgery. The medical term for these abnormal blood clots is venous thromboembolism (VTE).  What do you need to know about xarelto ? Take your Xarelto ONCE DAILY at the same time every day. You may take it either with or without food.  If you have difficulty swallowing the tablet whole, you may crush it and mix in applesauce just prior to taking your dose.  Take Xarelto exactly as prescribed by your doctor and DO NOT stop taking Xarelto without talking to the doctor who prescribed the medication.  Stopping without other VTE prevention medication to take the place of Xarelto may increase your risk of developing a clot.  After discharge, you should have regular check-up  appointments with your healthcare provider that is prescribing your Xarelto.    What do you do if you miss a dose? If you miss a dose, take  it as soon as you remember on the same day then continue your regularly scheduled once daily regimen the next day. Do not take two doses of Xarelto on the same day.   Important Safety Information A possible side effect of Xarelto is bleeding. You should call your healthcare provider right away if you experience any of the following: Bleeding from an injury or your nose that does not stop. Unusual colored urine (red or dark brown) or unusual colored stools (red or black). Unusual bruising for unknown reasons. A serious fall or if you hit your head (even if there is no bleeding).  Some medicines may interact with Xarelto and might increase your risk of bleeding while on Xarelto. To help avoid this, consult your healthcare provider or pharmacist prior to using any new prescription or non-prescription medications, including herbals, vitamins, non-steroidal anti-inflammatory drugs (NSAIDs) and supplements.  This website has more information on Xarelto: VisitDestination.com.br.

## 2022-12-05 NOTE — Anesthesia Postprocedure Evaluation (Signed)
Anesthesia Post Note  Patient: Anthony Reese  Procedure(s) Performed: TOTAL KNEE ARTHROPLASTY (Left: Knee) Right knee polyethylene revision (Right: Knee)     Patient location during evaluation: Nursing Unit Anesthesia Type: Regional and Combined Spinal/Epidural Level of consciousness: oriented and awake and alert Pain management: pain level controlled Vital Signs Assessment: post-procedure vital signs reviewed and stable Respiratory status: spontaneous breathing and respiratory function stable Cardiovascular status: blood pressure returned to baseline and stable Postop Assessment: no headache, no backache, no apparent nausea or vomiting and patient able to bend at knees Anesthetic complications: no   No notable events documented.  Last Vitals:  Vitals:   12/05/22 1922 12/05/22 1956  BP: 115/70 118/76  Pulse: (!) 56 65  Resp: 15 17  Temp: 36.5 C 36.5 C  SpO2: 100% 98%    Last Pain:  Vitals:   12/05/22 1956  TempSrc: Oral  PainSc:                  Trevor Iha

## 2022-12-05 NOTE — Interval H&P Note (Signed)
History and Physical Interval Note:  12/05/2022 1:08 PM  Anthony Reese  has presented today for surgery, with the diagnosis of Left knee osteoarthritis; unstable Right total knee arthroplasty.  The various methods of treatment have been discussed with the patient and family. After consideration of risks, benefits and other options for treatment, the patient has consented to  Procedure(s): TOTAL KNEE ARTHROPLASTY (Left) Right knee polyethylene revision (Right) as a surgical intervention.  The patient's history has been reviewed, patient examined, no change in status, stable for surgery.  I have reviewed the patient's chart and labs.  Questions were answered to the patient's satisfaction.     [name]

## 2022-12-05 NOTE — Brief Op Note (Signed)
12/05/2022  5:48 PM  PATIENT:  Anthony Reese  52 y.o. male  PRE-OPERATIVE DIAGNOSIS:  Left knee osteoarthritis; unstable Right total knee arthroplasty  POST-OPERATIVE DIAGNOSIS:  Left knee osteoarthritis; unstable Right total knee arthroplasty  PROCEDURE:  Procedure(s): TOTAL KNEE ARTHROPLASTY (Left) Right knee polyethylene revision (Right)  SURGEON:  Surgeon(s) and Role:    Ollen Gross, MD - Primary  PHYSICIAN ASSISTANT:   ASSISTANTS: Weston Brass, PA-C   ANESTHESIA:   regional and spinal  EBL:  100 mL   BLOOD ADMINISTERED:none  DRAINS: none   LOCAL MEDICATIONS USED:  OTHER Exparel to left  knee  COUNTS:  YES  TOURNIQUET:   Total Tourniquet Time Documented: Thigh (Left) - 39 minutes Thigh (Left) - 51 minutes Total: Thigh (Left) - 90 minutes   DICTATION: .Other Dictation: Dictation Number [id number]  PLAN OF CARE: Admit to inpatient   PATIENT DISPOSITION:  PACU - hemodynamically stable.

## 2022-12-06 ENCOUNTER — Other Ambulatory Visit: Payer: Self-pay

## 2022-12-06 ENCOUNTER — Encounter (HOSPITAL_COMMUNITY): Payer: Self-pay | Admitting: Orthopedic Surgery

## 2022-12-06 LAB — BASIC METABOLIC PANEL
Anion gap: 9 (ref 5–15)
BUN: 12 mg/dL (ref 6–20)
CO2: 21 mmol/L — ABNORMAL LOW (ref 22–32)
Calcium: 8.5 mg/dL — ABNORMAL LOW (ref 8.9–10.3)
Chloride: 103 mmol/L (ref 98–111)
Creatinine, Ser: 0.86 mg/dL (ref 0.61–1.24)
GFR, Estimated: >60 mL/min (ref >60)
Glucose, Bld: 165 mg/dL — ABNORMAL HIGH (ref 70–99)
Potassium: 4.2 mmol/L (ref 3.5–5.1)
Sodium: 133 mmol/L — ABNORMAL LOW (ref 135–145)

## 2022-12-06 LAB — CBC
HCT: 42.3 % (ref 39.0–52.0)
Hemoglobin: 14.6 g/dL (ref 13.0–17.0)
MCH: 33.5 pg (ref 26.0–34.0)
MCHC: 34.5 g/dL (ref 30.0–36.0)
MCV: 97 fL (ref 80.0–100.0)
Platelets: 291 10*3/uL (ref 150–400)
RBC: 4.36 MIL/uL (ref 4.22–5.81)
RDW: 12.4 % (ref 11.5–15.5)
WBC: 13.2 10*3/uL — ABNORMAL HIGH (ref 4.0–10.5)
nRBC: 0 % (ref 0.0–0.2)

## 2022-12-06 LAB — AEROBIC/ANAEROBIC CULTURE W GRAM STAIN (SURGICAL/DEEP WOUND)

## 2022-12-06 NOTE — Evaluation (Signed)
Physical Therapy Evaluation Patient Details Name: Anthony Reese MRN: [id number] DOB: [birth year] Today's Date: 12/06/2022  History of Present Illness  52 yo male s/p L TKA and R knee poly revision. PMH: R TKA and revision, obesity, tachycardia, R ACL repair at age 49, CTR, R biceps tendon tear, tobacco use  Clinical Impression  Pt is s/p L TKA  and R knee poly revision resulting in the deficits listed below (see PT Problem List).  Pt agreeable to PT,  motivated however limited by pain.  Anticipate steady progress with mobility once pain  more controlled.   Pt will benefit from acute skilled PT to increase their independence and safety with mobility to allow discharge.         Recommendations for follow up therapy are one component of a multi-disciplinary discharge planning process, led by the attending physician.  Recommendations may be updated based on patient status, additional functional criteria and insurance authorization.  Follow Up Recommendations       Assistance Recommended at Discharge PRN  Patient can return home with the following  A little help with bathing/dressing/bathroom;Assistance with cooking/housework;Assist for transportation;Help with stairs or ramp for entrance    Equipment Recommendations None recommended by PT  Recommendations for Other Services       Functional Status Assessment Patient has had a recent decline in their functional status and demonstrates the ability to make significant improvements in function in a reasonable and predictable amount of time.     Precautions / Restrictions Precautions Precautions: Fall;Knee Required Braces or Orthoses: Knee Immobilizer - Right;Knee Immobilizer - Left Knee Immobilizer - Right: Discontinue once straight leg raise with < 10 degree lag Knee Immobilizer - Left: Discontinue once straight leg raise with < 10 degree lag Restrictions Weight Bearing Restrictions: No Other Position/Activity Restrictions: WBAT       Mobility  Bed Mobility Overal bed mobility: Needs Assistance Bed Mobility: Supine to Sit     Supine to sit: Min guard, Min assist     General bed mobility comments: incr time, assist with LLE    Transfers Overall transfer level: Needs assistance Equipment used: Rolling walker (2 wheels) Transfers: Sit to/from Stand Sit to Stand: From elevated surface, Min guard           General transfer comment: cues for hand placement and LE position    Ambulation/Gait Ambulation/Gait assistance: Min guard, Min assist Gait Distance (Feet): 15 Feet Assistive device: Rolling walker (2 wheels) Gait Pattern/deviations: Step-to pattern, Decreased step length - left, Decreased step length - right       General Gait Details: cues for sequence and RW proximity; incr time, heavily reliant on UEs  Stairs            Wheelchair Mobility    Modified Rankin (Stroke Patients Only)       Balance Overall balance assessment: Needs assistance Sitting-balance support: Feet supported, No upper extremity supported Sitting balance-Leahy Scale: Good     Standing balance support: Reliant on assistive device for balance Standing balance-Leahy Scale: Poor                               Pertinent Vitals/Pain Pain Assessment Pain Assessment: 0-10 Pain Score: 9  Pain Location: bile knees, L>R Pain Descriptors / Indicators: Aching, Discomfort, Grimacing, Guarding, Sore Pain Intervention(s): Limited activity within patient's tolerance, Monitored during session, Repositioned, Ice applied    Home Living Family/patient expects to be discharged to:: Private  residence Living Arrangements: Spouse/significant other;Children Available Help at Discharge: Family;Available 24 hours/day Type of Home: House Home Access: Stairs to enter   Entrance Stairs-Number of Steps: 1 Alternate Level Stairs-Number of Steps: flight Home Layout: Multi-level;Able to live on main level with  bedroom/bathroom Home Equipment: Agricultural consultant (2 wheels);BSC/3in1      Prior Function Prior Level of Function : Independent/Modified Independent                     Hand Dominance        Extremity/Trunk Assessment   Upper Extremity Assessment Upper Extremity Assessment: Overall WFL for tasks assessed    Lower Extremity Assessment Lower Extremity Assessment: RLE deficits/detail;LLE deficits/detail RLE Deficits / Details: ankle WFL, knee extension and hip flexion grossly 2+/5, limited by post op pain/weakness LLE Deficits / Details: ankle WFL, knee extension and hip flexion grossly 2/5, limited by post op pain/weakness       Communication   Communication: No difficulties  Cognition Arousal/Alertness: Awake/alert Behavior During Therapy: WFL for tasks assessed/performed Overall Cognitive Status: Within Functional Limits for tasks assessed                                          General Comments      Exercises Total Joint Exercises Ankle Circles/Pumps: AROM, Both, 10 reps   Assessment/Plan    PT Assessment Patient needs continued PT services  PT Problem List Decreased strength;Decreased range of motion;Decreased activity tolerance;Decreased mobility;Decreased balance;Decreased knowledge of precautions;Pain;Decreased knowledge of use of DME       PT Treatment Interventions DME instruction;Therapeutic exercise;Gait training;Functional mobility training;Therapeutic activities;Patient/family education;Stair training    PT Goals (Current goals can be found in the Care Plan section)  Acute Rehab PT Goals Patient Stated Goal: less pain PT Goal Formulation: With patient Time For Goal Achievement: 12/13/22 Potential to Achieve Goals: Good    Frequency 7X/week     Co-evaluation               AM-PAC PT "6 Clicks" Mobility  Outcome Measure Help needed turning from your back to your side while in a flat bed without using bedrails?: A  Little Help needed moving from lying on your back to sitting on the side of a flat bed without using bedrails?: A Little Help needed moving to and from a bed to a chair (including a wheelchair)?: A Little Help needed standing up from a chair using your arms (e.g., wheelchair or bedside chair)?: A Little Help needed to walk in hospital room?: A Little Help needed climbing 3-5 steps with a railing? : A Lot 6 Click Score: 17    End of Session Equipment Utilized During Treatment: Gait belt Activity Tolerance: Patient tolerated treatment well Patient left: with call bell/phone within reach;in chair;with chair alarm set   PT Visit Diagnosis: Other abnormalities of gait and mobility (R26.89);Difficulty in walking, not elsewhere classified (R26.2)    Time: 2956-2130 PT Time Calculation (min) (ACUTE ONLY): 22 min   Charges:   PT Evaluation $PT Eval Low Complexity: 1 Low          Sabree Nuon, PT  Acute Rehab Dept ([facility]) [phone]  12/06/2022   [facility] 12/06/2022, 11:06 AM

## 2022-12-06 NOTE — TOC Transition Note (Signed)
Transition of Care [facility]) - CM/SW Discharge Note   Patient Details  Name: Anthony Reese MRN: [id number] Date of Birth: May 29, 1971  [facility]) CM/SW Contact:  Amada Jupiter, LCSW Phone Number: 12/06/2022, 9:28 AM   Clinical Narrative:     Met with pt who confirms he has needed DME in the home.  OPPT already set up with [facility]).  No TOC needs.   Final next level of care: OP Rehab Barriers to Discharge: No Barriers Identified   Patient Goals and CMS Choice      Discharge Placement                         Discharge Plan and Services Additional resources added to the After Visit Summary for                  DME Arranged: N/A DME Agency: NA                  Social Determinants of Health (SDOH) Interventions SDOH Screenings   Food Insecurity: No Food Insecurity (12/06/2022)  Housing: Patient Declined (12/06/2022)  Transportation Needs: No Transportation Needs (12/06/2022)  Utilities: Patient Declined (12/06/2022)  Depression (PHQ2-9): Low Risk  (01/18/2022)  Tobacco Use: High Risk (12/05/2022)     Readmission Risk Interventions    12/06/2022    9:27 AM  Readmission Risk Prevention Plan  Post Dischage Appt Complete  Medication Screening Complete  Transportation Screening Complete

## 2022-12-06 NOTE — Progress Notes (Signed)
   Subjective: 1 Day Post-Op Procedure(s) (LRB): TOTAL KNEE ARTHROPLASTY (Left) Right knee polyethylene revision (Right) Patient reports pain as moderate.   Patient seen in rounds by Dr. Lequita Halt. Patient is doing well this morning other than pain in both knees. No issues overnight. Denies chest pain or SOB.  We will begin therapy today.  Objective: Vital signs in last 24 hours: Temp:  [96.2 F (35.7 C)-98.5 F (36.9 C)] 97.7 F (36.5 C) (06/04 1610) Pulse Rate:  [56-94] 85 (06/04 0608) Resp:  [12-18] 17 (06/04 0608) BP: (109-128)/(70-96) 120/79 (06/04 0608) SpO2:  [93 %-100 %] 97 % (06/04 0608) Weight:  [102.5 kg] 102.5 kg (06/03 1227)  Intake/Output from previous day:  Intake/Output Summary (Last 24 hours) at 12/06/2022 0727 Last data filed at 12/06/2022 0608 Gross per 24 hour  Intake 4404.26 ml  Output 2200 ml  Net 2204.26 ml     Intake/Output this shift: No intake/output data recorded.  Labs: Recent Labs    12/06/22 0340  HGB 14.6   Recent Labs    12/06/22 0340  WBC 13.2*  RBC 4.36  HCT 42.3  PLT 291   Recent Labs    12/06/22 0340  NA 133*  K 4.2  CL 103  CO2 21*  BUN 12  CREATININE 0.86  GLUCOSE 165*  CALCIUM 8.5*   No results for input(s): "LABPT", "INR" in the last 72 hours.  Exam: General - Patient is Alert and Oriented Extremity - Neurologically intact Neurovascular intact Sensation intact distally Dorsiflexion/Plantar flexion intact Dressing - dressing C/D/I Motor Function - intact, moving foot and toes well on exam.   Past Medical History:  Diagnosis Date   Allergy    Anxiety    Arthritis    Chicken pox    Dysrhythmia    Tachycardia   H/O tobacco use, presenting hazards to health 07/31/2011   Headache, cluster, episodic    none in several years   Injury of left shoulder 01/30/2013   Low testosterone    Nasal polyp 11/18/2013   On home oxygen therapy    ONLY uses for cluster headaches   Other and unspecified hyperlipidemia  11/18/2013   Overweight(278.02)    Oxygen deficiency    Preventative health care 11/18/2013   Radiculopathy affecting upper extremity 11/20/2011   Tobacco use disorder 07/31/2011    Assessment/Plan: 1 Day Post-Op Procedure(s) (LRB): TOTAL KNEE ARTHROPLASTY (Left) Right knee polyethylene revision (Right) Principal Problem:   OA (osteoarthritis) of knee Active Problems:   Osteoarthritis of left knee  Estimated body mass index is 34.36 kg/m as calculated from the following:   Height as of this encounter: 5\' 8"  (1.727 m).   Weight as of this encounter: 102.5 kg. Advance diet Up with therapy D/C IV fluids  Anticipated LOS equal to or greater than 2 midnights due to - Age 75 and older with one or more of the following:  - Obesity  - Expected need for hospital services (PT, OT, Nursing) required for safe  discharge  - Anticipated need for postoperative skilled nursing care or inpatient rehab  - Active co-morbidities: None OR   - Unanticipated findings during/Post Surgery: None  - Patient is a high risk of re-admission due to: None   DVT Prophylaxis - Xarelto Weight bearing as tolerated. Begin therapy.  Plan is to go Home after hospital stay. Possible discharge tomorrow pending progression with therapy.  Arther Abbott, PA-C Orthopedic Surgery [phone] 12/06/2022, 7:27 AM

## 2022-12-06 NOTE — Progress Notes (Signed)
Physical Therapy Treatment Patient Details Name: Anthony Reese MRN: [id number] DOB: 1971-06-17 Today's Date: 12/06/2022   History of Present Illness 52 yo male s/p L TKA and R knee poly revision. PMH: R TKA and revision, obesity, tachycardia, R ACL repair at age 57, CTR, R biceps tendon tear, tobacco use    PT Comments    Pt is progressing although continues to have some issues with pain control.  Incr gait distance, tolerated TKA exercises however feel pt will need another day to work on independence with mobility as well as pain control.    Recommendations for follow up therapy are one component of a multi-disciplinary discharge planning process, led by the attending physician.  Recommendations may be updated based on patient status, additional functional criteria and insurance authorization.  Follow Up Recommendations       Assistance Recommended at Discharge PRN  Patient can return home with the following A little help with bathing/dressing/bathroom;Assistance with cooking/housework;Assist for transportation;Help with stairs or ramp for entrance   Equipment Recommendations  None recommended by PT    Recommendations for Other Services       Precautions / Restrictions Precautions Precautions: Fall;Knee Required Braces or Orthoses: Knee Immobilizer - Right;Knee Immobilizer - Left Knee Immobilizer - Right: Discontinue once straight leg raise with < 10 degree lag Knee Immobilizer - Left: Discontinue once straight leg raise with < 10 degree lag Restrictions Weight Bearing Restrictions: No Other Position/Activity Restrictions: WBAT     Mobility  Bed Mobility Overal bed mobility: Needs Assistance Bed Mobility: Sit to Supine     Supine to sit: Min guard, Min assist Sit to supine: Min guard, Min assist   General bed mobility comments: incr time, assist with LLE    Transfers Overall transfer level: Needs assistance Equipment used: Rolling walker (2 wheels) Transfers: Sit  to/from Stand Sit to Stand: Min guard           General transfer comment: cues for hand placement and LE position    Ambulation/Gait Ambulation/Gait assistance: Min guard Gait Distance (Feet): 50 Feet Assistive device: Rolling walker (2 wheels) Gait Pattern/deviations: Step-to pattern, Decreased step length - left, Decreased step length - right       General Gait Details: cues for sequence and RW proximity; incr time, heavily reliant on UEs d/t pain   Stairs             Wheelchair Mobility    Modified Rankin (Stroke Patients Only)       Balance Overall balance assessment: Needs assistance Sitting-balance support: Feet supported, No upper extremity supported Sitting balance-Leahy Scale: Good     Standing balance support: Reliant on assistive device for balance Standing balance-Leahy Scale: Poor                              Cognition Arousal/Alertness: Awake/alert Behavior During Therapy: WFL for tasks assessed/performed Overall Cognitive Status: Within Functional Limits for tasks assessed                                          Exercises Total Joint Exercises Ankle Circles/Pumps: AROM, Both, 10 reps Quad Sets: AROM, Both, 10 reps Heel Slides: AAROM, Both, 10 reps    General Comments        Pertinent Vitals/Pain Pain Assessment Pain Assessment: 0-10 Pain Score: 8  Pain Location: bile knees,  L>R Pain Descriptors / Indicators: Aching, Discomfort, Grimacing, Guarding, Sore Pain Intervention(s): Limited activity within patient's tolerance, Monitored during session, Premedicated before session, Repositioned, Ice applied    Home Living Family/patient expects to be discharged to:: Private residence Living Arrangements: Spouse/significant other;Children Available Help at Discharge: Family;Available 24 hours/day Type of Home: House Home Access: Stairs to enter   Entrance Stairs-Number of Steps: 1 Alternate Level  Stairs-Number of Steps: flight Home Layout: Multi-level;Able to live on main level with bedroom/bathroom Home Equipment: Rolling Walker (2 wheels);BSC/3in1      Prior Function            PT Goals (current goals can now be found in the care plan section) Acute Rehab PT Goals Patient Stated Goal: less pain PT Goal Formulation: With patient Time For Goal Achievement: 12/13/22 Potential to Achieve Goals: Good Progress towards PT goals: Progressing toward goals    Frequency    7X/week      PT Plan Current plan remains appropriate    Co-evaluation              AM-PAC PT "6 Clicks" Mobility   Outcome Measure  Help needed turning from your back to your side while in a flat bed without using bedrails?: A Little Help needed moving from lying on your back to sitting on the side of a flat bed without using bedrails?: A Little Help needed moving to and from a bed to a chair (including a wheelchair)?: A Little Help needed standing up from a chair using your arms (e.g., wheelchair or bedside chair)?: A Little Help needed to walk in hospital room?: A Little Help needed climbing 3-5 steps with a railing? : A Little 6 Click Score: 18    End of Session Equipment Utilized During Treatment: Gait belt;Left knee immobilizer Activity Tolerance: Patient tolerated treatment well;Patient limited by pain Patient left: in bed;with call bell/phone within reach;with bed alarm set;with family/visitor present   PT Visit Diagnosis: Other abnormalities of gait and mobility (R26.89);Difficulty in walking, not elsewhere classified (R26.2)     Time: 4034-7425 PT Time Calculation (min) (ACUTE ONLY): 26 min  Charges:  $Gait Training: 8-22 mins $Therapeutic Exercise: 8-22 mins                     Delice Bison, PT  Acute Rehab Dept [facility]) [phone]  12/06/2022    [facility] 12/06/2022, 2:53 PM

## 2022-12-07 LAB — CBC
HCT: 41.6 % (ref 39.0–52.0)
Hemoglobin: 14.2 g/dL (ref 13.0–17.0)
MCH: 33.9 pg (ref 26.0–34.0)
MCHC: 34.1 g/dL (ref 30.0–36.0)
MCV: 99.3 fL (ref 80.0–100.0)
Platelets: 297 10*3/uL (ref 150–400)
RBC: 4.19 MIL/uL — ABNORMAL LOW (ref 4.22–5.81)
RDW: 13 % (ref 11.5–15.5)
WBC: 14 10*3/uL — ABNORMAL HIGH (ref 4.0–10.5)
nRBC: 0 % (ref 0.0–0.2)

## 2022-12-07 LAB — AEROBIC/ANAEROBIC CULTURE W GRAM STAIN (SURGICAL/DEEP WOUND)

## 2022-12-07 MED ORDER — METHOCARBAMOL 500 MG PO TABS: 500.0000 mg | ORAL_TABLET | Freq: Four times a day (QID) | ORAL | 0 refills | Status: DC | PRN

## 2022-12-07 MED ORDER — TRAMADOL HCL 50 MG PO TABS: 50.0000 mg | ORAL_TABLET | Freq: Four times a day (QID) | ORAL | 0 refills | Status: DC | PRN

## 2022-12-07 MED ORDER — RIVAROXABAN 10 MG PO TABS: 10.0000 mg | ORAL_TABLET | Freq: Every day | ORAL | 0 refills | Status: AC

## 2022-12-07 MED ORDER — OXYCODONE HCL 5 MG PO TABS: 5.0000 mg | ORAL_TABLET | Freq: Four times a day (QID) | ORAL | 0 refills | Status: DC | PRN

## 2022-12-07 MED ORDER — GABAPENTIN 300 MG PO CAPS: ORAL_CAPSULE | ORAL | 0 refills | Status: DC

## 2022-12-07 NOTE — Progress Notes (Signed)
   Subjective: 2 Days Post-Op Procedure(s) (LRB): TOTAL KNEE ARTHROPLASTY (Left) Right knee polyethylene revision (Right) Patient reports pain as moderate.   Patient seen in rounds by Dr. Lequita Halt. Patient is doing well other than pain in both knees. Did very well with therapy yesterday. Plan is to go Home after hospital stay.  Objective: Vital signs in last 24 hours: Temp:  [97.5 F (36.4 C)-98 F (36.7 C)] 98 F (36.7 C) (06/05 0641) Pulse Rate:  [79-88] 87 (06/05 0641) Resp:  [16-18] 17 (06/05 0641) BP: (129-154)/(89-110) 154/110 (06/05 0641) SpO2:  [96 %-100 %] 100 % (06/05 0641)  Intake/Output from previous day:  Intake/Output Summary (Last 24 hours) at 12/07/2022 0738 Last data filed at 12/07/2022 0200 Gross per 24 hour  Intake 1541.26 ml  Output 2052 ml  Net -510.74 ml    Intake/Output this shift: No intake/output data recorded.  Labs: Recent Labs    12/06/22 0340 12/07/22 0324  HGB 14.6 14.2   Recent Labs    12/06/22 0340 12/07/22 0324  WBC 13.2* 14.0*  RBC 4.36 4.19*  HCT 42.3 41.6  PLT 291 297   Recent Labs    12/06/22 0340  NA 133*  K 4.2  CL 103  CO2 21*  BUN 12  CREATININE 0.86  GLUCOSE 165*  CALCIUM 8.5*   No results for input(s): "LABPT", "INR" in the last 72 hours.  Exam: General - Patient is Alert and Oriented Extremity - Neurologically intact Neurovascular intact Sensation intact distally Dorsiflexion/Plantar flexion intact Dressing/Incision - clean, dry, no drainage Motor Function - intact, moving foot and toes well on exam.   Past Medical History:  Diagnosis Date   Allergy    Anxiety    Arthritis    Chicken pox    Dysrhythmia    Tachycardia   H/O tobacco use, presenting hazards to health 07/31/2011   Headache, cluster, episodic    none in several years   Injury of left shoulder 01/30/2013   Low testosterone    Nasal polyp 11/18/2013   On home oxygen therapy    ONLY uses for cluster headaches   Other and unspecified  hyperlipidemia 11/18/2013   Overweight(278.02)    Oxygen deficiency    Preventative health care 11/18/2013   Radiculopathy affecting upper extremity 11/20/2011   Tobacco use disorder 07/31/2011    Assessment/Plan: 2 Days Post-Op Procedure(s) (LRB): TOTAL KNEE ARTHROPLASTY (Left) Right knee polyethylene revision (Right) Principal Problem:   OA (osteoarthritis) of knee Active Problems:   Osteoarthritis of left knee  Estimated body mass index is 34.36 kg/m as calculated from the following:   Height as of this encounter: 5\' 8"  (1.727 m).   Weight as of this encounter: 102.5 kg. Up with therapy  DVT Prophylaxis - Xarelto Weight-bearing as tolerated  Did well with therapy yesterday, but is still having a fair amount of pain. We will see how he does with PT today. If doing very well and his pain is controlled, could potentially go home today. Will more likely be tomorrow however.  Scheduled for OPPT.  Arther Abbott, PA-C Orthopedic Surgery [phone] 12/07/2022, 7:38 AM

## 2022-12-07 NOTE — Plan of Care (Signed)
  Problem: Education: Goal: Knowledge of the prescribed therapeutic regimen will improve Outcome: Progressing Goal: Individualized Educational Video(s) Outcome: Progressing   Problem: Activity: Goal: Ability to avoid complications of mobility impairment will improve Outcome: Progressing Goal: Range of joint motion will improve Outcome: Progressing   

## 2022-12-07 NOTE — Progress Notes (Signed)
PT TX NOTE   12/07/22 1500  PT Visit Information  Last PT Received On 12/07/22  Assistance Needed Pt demonstrates incr gait distance, tolerating A/AROM however continues to have significant pain with WBing;  pt and wife report pt stayed in basement after last surgery however basement is under renovation so pt will need to ascend ~flight of stairs to enter home--discussed techniques/options today--hopefully will be able to work on stairs tomorrow.   History of Present Illness 52 yo male s/p L TKA and R knee poly revision. PMH: R TKA and revision, obesity, tachycardia, R ACL repair at age 54, CTR, R biceps tendon tear, tobacco use  Subjective Data  Patient Stated Goal less pain  Precautions  Precautions Fall;Knee  Precaution Comments deferred use of L KI to allow flexion for transfers; pt unable to SLR IND'ly on L but demonstrates  good quad activation  Required Braces or Orthoses Knee Immobilizer - Right;Knee Immobilizer - Left  Knee Immobilizer - Right Discontinue once straight leg raise with < 10 degree lag  Knee Immobilizer - Left Discontinue once straight leg raise with < 10 degree lag  Restrictions  Weight Bearing Restrictions No  Other Position/Activity Restrictions WBAT  Pain Assessment  Pain Assessment 0-10  Pain Score 6  Pain Location bil knees, L>R  Pain Descriptors / Indicators Aching;Discomfort;Grimacing;Guarding;Sore  Pain Intervention(s) Limited activity within patient's tolerance;Monitored during session;Premedicated before session;Repositioned;Ice applied  Cognition  Arousal/Alertness Awake/alert  Behavior During Therapy WFL for tasks assessed/performed  Overall Cognitive Status Within Functional Limits for tasks assessed  Bed Mobility  Overal bed mobility Needs Assistance  Bed Mobility Sit to Supine  Sit to supine Min guard;Min assist  General bed mobility comments incr time, assist with LLE d/t pain  Transfers  Overall transfer level Needs assistance  Equipment  used Rolling walker (2 wheels)  Transfers Sit to/from Stand  Sit to Stand Min guard;From elevated surface  General transfer comment cues for hand placement and LE position  Ambulation/Gait  Ambulation/Gait assistance Min guard;Supervision  Gait Distance (Feet) 100 Feet  Assistive device Rolling walker (2 wheels)  Gait Pattern/deviations Step-to pattern;Decreased step length - left;Decreased step length - right;Decreased stance time - left  General Gait Details cues for sequence and RW proximity; incr time needed, improving wt shift to LEs with incr distance-remains reliant on UEs d/t pain; working on knee flexion during swing  Balance  Sitting-balance support Feet supported;No upper extremity supported  Sitting balance-Leahy Scale Good  Standing balance support Reliant on assistive device for balance  Standing balance-Leahy Scale Poor  Total Joint Exercises  Ankle Circles/Pumps AROM;Both;10 reps  Quad Sets AROM;Both;10 reps  Heel Slides AAROM;Both;10 reps  Knee Flexion AAROM;Both;5 reps;Seated;10 reps  Goniometric ROM R knee grossly 12 to 80 degrees flexion; L knee grossly 15 to 60 degrees flexion  PT - End of Session  Equipment Utilized During Treatment Gait belt  Activity Tolerance Patient tolerated treatment well;Patient limited by pain  Patient left in chair;with call bell/phone within reach;with chair alarm set  Nurse Communication Mobility status   PT - Assessment/Plan  PT Plan Current plan remains appropriate  PT Visit Diagnosis Other abnormalities of gait and mobility (R26.89);Difficulty in walking, not elsewhere classified (R26.2)  PT Frequency (ACUTE ONLY) 7X/week  Follow Up Recommendations Follow physician's recommendations for discharge plan and follow up therapies  Assistance recommended at discharge PRN  Patient can return home with the following A little help with bathing/dressing/bathroom;Assistance with cooking/housework;Assist for transportation;Help with stairs or  ramp for entrance  PT equipment None recommended by PT  AM-PAC PT "6 Clicks" Mobility Outcome Measure (Version 2)  Help needed turning from your back to your side while in a flat bed without using bedrails? 3  Help needed moving from lying on your back to sitting on the side of a flat bed without using bedrails? 3  Help needed moving to and from a bed to a chair (including a wheelchair)? 3  Help needed standing up from a chair using your arms (e.g., wheelchair or bedside chair)? 3  Help needed to walk in hospital room? 3  Help needed climbing 3-5 steps with a railing?  3  6 Click Score 18  Consider Recommendation of Discharge To: Home with [facility]  PT Goal Progression  Progress towards PT goals Progressing toward goals  Acute Rehab PT Goals  PT Goal Formulation With patient  Time For Goal Achievement 12/13/22  Potential to Achieve Goals Good  PT Time Calculation  PT Start Time (ACUTE ONLY) 1455  PT Stop Time (ACUTE ONLY) 1525  PT Time Calculation (min) (ACUTE ONLY) 30 min  PT General Charges  $$ ACUTE PT VISIT 1 Visit  PT Treatments  $Gait Training 8-22 mins  $Therapeutic Exercise 8-22 mins

## 2022-12-07 NOTE — Progress Notes (Signed)
Physical Therapy Treatment Patient Details Name: Anthony Reese MRN: [id number] DOB: 03-Jul-1971 Today's Date: 12/07/2022   History of Present Illness 52 yo male s/p L TKA and R knee poly revision. PMH: R TKA and revision, obesity, tachycardia, R ACL repair at age 46, CTR, R biceps tendon tear, tobacco use    PT Comments    Pt progressing toward goals but still having some issues with pain control. May need and additional day with PT to work on stairs, overall independence with mobility and pain control.    Recommendations for follow up therapy are one component of a multi-disciplinary discharge planning process, led by the attending physician.  Recommendations may be updated based on patient status, additional functional criteria and insurance authorization.  Follow Up Recommendations       Assistance Recommended at Discharge PRN  Patient can return home with the following A little help with bathing/dressing/bathroom;Assistance with cooking/housework;Assist for transportation;Help with stairs or ramp for entrance   Equipment Recommendations  None recommended by PT    Recommendations for Other Services       Precautions / Restrictions Precautions Precautions: Fall;Knee Precaution Comments: deferred use of L KI to allow flexion for transfers; pt unable to SLR IND'ly on L but demonstrates  good quad activation Required Braces or Orthoses: Knee Immobilizer - Right;Knee Immobilizer - Left Knee Immobilizer - Right: Discontinue once straight leg raise with < 10 degree lag Knee Immobilizer - Left: Discontinue once straight leg raise with < 10 degree lag Restrictions Weight Bearing Restrictions: No Other Position/Activity Restrictions: WBAT     Mobility  Bed Mobility Overal bed mobility: Needs Assistance Bed Mobility: Supine to Sit     Supine to sit: Min assist     General bed mobility comments: incr time, assist with LLE d/t pain    Transfers Overall transfer level: Needs  assistance Equipment used: Rolling walker (2 wheels) Transfers: Sit to/from Stand Sit to Stand: Min guard, From elevated surface           General transfer comment: cues for hand placement and LE position    Ambulation/Gait Ambulation/Gait assistance: Min guard, Supervision Gait Distance (Feet): 80 Feet Assistive device: Rolling walker (2 wheels) Gait Pattern/deviations: Step-to pattern, Decreased step length - left, Decreased step length - right, Decreased stance time - left       General Gait Details: cues for sequence and RW proximity; incr time needed, improving wt shift to LEs with incr distance-remains reliant on UEs d/t pain   Stairs             Wheelchair Mobility    Modified Rankin (Stroke Patients Only)       Balance                                            Cognition Arousal/Alertness: Awake/alert Behavior During Therapy: WFL for tasks assessed/performed Overall Cognitive Status: Within Functional Limits for tasks assessed                                          Exercises Total Joint Exercises Ankle Circles/Pumps: AROM, Both, 10 reps Quad Sets: AROM, Both, 10 reps Knee Flexion: AAROM, Both, 5 reps, Seated    General Comments        Pertinent Vitals/Pain Pain Assessment  Pain Assessment: 0-10 Pain Score: 5  Pain Location: bil knees, L>R Pain Descriptors / Indicators: Aching, Discomfort, Grimacing, Guarding, Sore Pain Intervention(s): Limited activity within patient's tolerance, Monitored during session, Premedicated before session, Repositioned    Home Living                          Prior Function            PT Goals (current goals can now be found in the care plan section) Acute Rehab PT Goals Patient Stated Goal: less pain PT Goal Formulation: With patient Time For Goal Achievement: 12/13/22 Potential to Achieve Goals: Good Progress towards PT goals: Progressing toward goals     Frequency    7X/week      PT Plan Current plan remains appropriate    Co-evaluation              AM-PAC PT "6 Clicks" Mobility   Outcome Measure  Help needed turning from your back to your side while in a flat bed without using bedrails?: A Little Help needed moving from lying on your back to sitting on the side of a flat bed without using bedrails?: A Little Help needed moving to and from a bed to a chair (including a wheelchair)?: A Little Help needed standing up from a chair using your arms (e.g., wheelchair or bedside chair)?: A Little Help needed to walk in hospital room?: A Little Help needed climbing 3-5 steps with a railing? : A Little 6 Click Score: 18    End of Session Equipment Utilized During Treatment: Gait belt Activity Tolerance: Patient tolerated treatment well;Patient limited by pain Patient left: in chair;with call bell/phone within reach;with chair alarm set Nurse Communication: Mobility status PT Visit Diagnosis: Other abnormalities of gait and mobility (R26.89);Difficulty in walking, not elsewhere classified (R26.2)     Time: 1610-9604 PT Time Calculation (min) (ACUTE ONLY): 32 min  Charges:  $Gait Training: 8-22 mins $Therapeutic Exercise: 8-22 mins                     Delice Bison, PT  Acute Rehab Dept ([facility]) [phone]  12/07/2022    [facility] 12/07/2022, 1:20 PM

## 2022-12-08 LAB — AEROBIC/ANAEROBIC CULTURE W GRAM STAIN (SURGICAL/DEEP WOUND)

## 2022-12-08 MED ORDER — HYDROMORPHONE HCL 1 MG/ML IJ SOLN
1.0000 mg | Freq: Once | INTRAMUSCULAR | Status: AC | PRN
  Administered 2022-12-08: 1 mg via INTRAVENOUS
  Filled 2022-12-08: qty 1

## 2022-12-08 MED ORDER — KETOROLAC TROMETHAMINE 15 MG/ML IJ SOLN
15.0000 mg | Freq: Once | INTRAMUSCULAR | Status: AC
  Administered 2022-12-08: 15 mg via INTRAVENOUS
  Filled 2022-12-08: qty 1

## 2022-12-08 NOTE — Progress Notes (Signed)
Physical Therapy Treatment Patient Details Name: Anthony Reese MRN: [id number] DOB: 06/19/1971 Today's Date: 12/08/2022   History of Present Illness 52 yo male s/p L TKA and R knee poly revision. PMH: R TKA and revision, obesity, tachycardia, R ACL repair at age 50, CTR, R biceps tendon tear, tobacco use    PT Comments    Pt with improved pain control this pm; reviewed gait and up down one step x2 with pt and wife. Pt to work on HEP again later today.  Pt still with some concerns regarding  pain control, and is quite fatigued this afternoon. Pt will likely be ready to d/c in am after PT  Recommendations for follow up therapy are one component of a multi-disciplinary discharge planning process, led by the attending physician.  Recommendations may be updated based on patient status, additional functional criteria and insurance authorization.  Follow Up Recommendations       Assistance Recommended at Discharge PRN  Patient can return home with the following A little help with bathing/dressing/bathroom;Assistance with cooking/housework;Assist for transportation;Help with stairs or ramp for entrance   Equipment Recommendations  None recommended by PT    Recommendations for Other Services       Precautions / Restrictions Precautions Precautions: Fall;Knee Precaution Comments: used L KI for up/down step Required Braces or Orthoses: Knee Immobilizer - Right;Knee Immobilizer - Left Knee Immobilizer - Right: Discontinue once straight leg raise with < 10 degree lag Knee Immobilizer - Left: Discontinue once straight leg raise with < 10 degree lag Restrictions Weight Bearing Restrictions: No Other Position/Activity Restrictions: WBAT     Mobility  Bed Mobility Overal bed mobility: Needs Assistance Bed Mobility: Supine to Sit     Supine to sit: Min assist     General bed mobility comments: in recliner    Transfers Overall transfer level: Needs assistance Equipment used: Rolling  walker (2 wheels) Transfers: Sit to/from Stand Sit to Stand: Min guard, From elevated surface           General transfer comment: cues for hand placement and LE position; min/guard for safety    Ambulation/Gait Ambulation/Gait assistance: Min guard, Supervision Gait Distance (Feet): 80 Feet Assistive device: Rolling walker (2 wheels) Gait Pattern/deviations: Step-to pattern, Decreased step length - left, Decreased step length - right, Decreased stance time - left, Decreased stance time - right       General Gait Details: improved wt shift to LEs, decr reliance on LEs with  pain more controlled this pm   Stairs Stairs: Yes Stairs assistance: Min assist, Min guard Stair Management: Backwards, With walker, Step to pattern Number of Stairs: 1 (x2) General stair comments: cues for sequence and technique; min assist for safety and to stabilize RW   Wheelchair Mobility    Modified Rankin (Stroke Patients Only)       Balance   Sitting-balance support: Feet supported, No upper extremity supported Sitting balance-Leahy Scale: Good     Standing balance support: Reliant on assistive device for balance Standing balance-Leahy Scale: Poor                              Cognition Arousal/Alertness: Awake/alert Behavior During Therapy: WFL for tasks assessed/performed Overall Cognitive Status: Within Functional Limits for tasks assessed  Exercises Total Joint Exercises Quad Sets: AROM, Both, 10 reps Heel Slides: AAROM, Both, 10 reps    General Comments        Pertinent Vitals/Pain Pain Assessment Pain Assessment: 0-10 Pain Score: 5  Pain Location: bil knees, L>R Pain Descriptors / Indicators: Aching, Discomfort, Grimacing, Guarding, Sore Pain Intervention(s): Limited activity within patient's tolerance, Monitored during session, Premedicated before session, Repositioned    Home Living                           Prior Function            PT Goals (current goals can now be found in the care plan section) Acute Rehab PT Goals Patient Stated Goal: less pain PT Goal Formulation: With patient Time For Goal Achievement: 12/13/22 Potential to Achieve Goals: Good Progress towards PT goals: Progressing toward goals    Frequency    7X/week      PT Plan Current plan remains appropriate    Co-evaluation              AM-PAC PT "6 Clicks" Mobility   Outcome Measure  Help needed turning from your back to your side while in a flat bed without using bedrails?: A Little Help needed moving from lying on your back to sitting on the side of a flat bed without using bedrails?: A Little Help needed moving to and from a bed to a chair (including a wheelchair)?: A Little Help needed standing up from a chair using your arms (e.g., wheelchair or bedside chair)?: A Little Help needed to walk in hospital room?: A Little Help needed climbing 3-5 steps with a railing? : A Little 6 Click Score: 18    End of Session Equipment Utilized During Treatment: Gait belt Activity Tolerance: Patient tolerated treatment well;Patient limited by pain Patient left: in chair;with call bell/phone within reach;with chair alarm set;with family/visitor present Nurse Communication: Mobility status PT Visit Diagnosis: Other abnormalities of gait and mobility (R26.89);Difficulty in walking, not elsewhere classified (R26.2)     Time: 1610-9604 PT Time Calculation (min) (ACUTE ONLY): 32 min  Charges:  $Gait Training: 23-37 mins                     Donyale Falcon, PT  Acute Rehab Dept [facility]) [phone]  12/08/2022    [facility] 12/08/2022, 3:33 PM

## 2022-12-08 NOTE — Plan of Care (Signed)
  Problem: Education: Goal: Knowledge of the prescribed therapeutic regimen will improve Outcome: Adequate for Discharge Goal: Individualized Educational Video(s) Outcome: Completed/Met   Problem: Activity: Goal: Ability to avoid complications of mobility impairment will improve Outcome: Progressing Goal: Range of joint motion will improve Outcome: Adequate for Discharge   Problem: Clinical Measurements: Goal: Postoperative complications will be avoided or minimized Outcome: Progressing   Problem: Pain Management: Goal: Pain level will decrease with appropriate interventions Outcome: Progressing   Problem: Skin Integrity: Goal: Will show signs of wound healing Outcome: Progressing   Problem: Education: Goal: Knowledge of General Education information will improve Description: Including pain rating scale, medication(s)/side effects and non-pharmacologic comfort measures Outcome: Adequate for Discharge   Problem: Health Behavior/Discharge Planning: Goal: Ability to manage health-related needs will improve Outcome: Progressing   Problem: Clinical Measurements: Goal: Ability to maintain clinical measurements within normal limits will improve Outcome: Progressing Goal: Will remain free from infection Outcome: Progressing Goal: Diagnostic test results will improve Outcome: Progressing Goal: Respiratory complications will improve Outcome: Progressing Goal: Cardiovascular complication will be avoided Outcome: Adequate for Discharge   Problem: Activity: Goal: Risk for activity intolerance will decrease Outcome: Adequate for Discharge   Problem: Nutrition: Goal: Adequate nutrition will be maintained Outcome: Completed/Met

## 2022-12-08 NOTE — Progress Notes (Addendum)
Subjective: 3 Days Post-Op Procedure(s) (LRB): TOTAL KNEE ARTHROPLASTY (Left) Right knee polyethylene revision (Right) Patient reports pain as  controlled .   Patient seen in rounds by Dr. Lequita Halt. Patient is well, and has had no acute complaints or problems No issues overnight. Denies chest pain, SOB, or calf pain.  We will continue therapy today, ambulated 100' yesterday.   Objective: Vital signs in last 24 hours: Temp:  [97.6 F (36.4 C)-97.8 F (36.6 C)] 97.8 F (36.6 C) (06/06 0603) Pulse Rate:  [75-96] 96 (06/06 0603) Resp:  [18] 18 (06/06 0603) BP: (128-147)/(91-99) 147/97 (06/06 0603) SpO2:  [97 %-99 %] 99 % (06/06 0603)  Intake/Output from previous day:  Intake/Output Summary (Last 24 hours) at 12/08/2022 0731 Last data filed at 12/08/2022 0559 Gross per 24 hour  Intake 720 ml  Output 1750 ml  Net -1030 ml     Intake/Output this shift: No intake/output data recorded.  Labs: Recent Labs    12/06/22 0340 12/07/22 0324  HGB 14.6 14.2   Recent Labs    12/06/22 0340 12/07/22 0324  WBC 13.2* 14.0*  RBC 4.36 4.19*  HCT 42.3 41.6  PLT 291 297   Recent Labs    12/06/22 0340  NA 133*  K 4.2  CL 103  CO2 21*  BUN 12  CREATININE 0.86  GLUCOSE 165*  CALCIUM 8.5*   No results for input(s): "LABPT", "INR" in the last 72 hours.  Exam: General - Patient is Alert, Appropriate, and Oriented Extremities - Neurovascular intact Calves soft and nontender Dressings - dressing C/D/I Motor Function - intact, moving foot and toes well on exam.   Past Medical History:  Diagnosis Date   Allergy    Anxiety    Arthritis    Chicken pox    Dysrhythmia    Tachycardia   H/O tobacco use, presenting hazards to health 07/31/2011   Headache, cluster, episodic    none in several years   Injury of left shoulder 01/30/2013   Low testosterone    Nasal polyp 11/18/2013   On home oxygen therapy    ONLY uses for cluster headaches   Other and unspecified hyperlipidemia  11/18/2013   Overweight(278.02)    Oxygen deficiency    Preventative health care 11/18/2013   Radiculopathy affecting upper extremity 11/20/2011   Tobacco use disorder 07/31/2011    Assessment/Plan: 3 Days Post-Op Procedure(s) (LRB): TOTAL KNEE ARTHROPLASTY (Left) Right knee polyethylene revision (Right) Principal Problem:   OA (osteoarthritis) of knee Active Problems:   Osteoarthritis of left knee  Estimated body mass index is 34.36 kg/m as calculated from the following:   Height as of this encounter: 5\' 8"  (1.727 m).   Weight as of this encounter: 102.5 kg. Up with therapy  Anticipated LOS equal to or greater than 2 midnights due to - Age 21 and older with one or more of the following:  - Obesity  - Expected need for hospital services (PT, OT, Nursing) required for safe  discharge  - Anticipated need for postoperative skilled nursing care or inpatient rehab  - Active co-morbidities: Cardiac Arrhythmia OR   - Unanticipated findings during/Post Surgery: Slow post-op progression: GI, pain control, mobility  - Patient is a high risk of re-admission due to: None   DVT Prophylaxis - Xarelto Weight bearing as tolerated. Continue therapy.  Plan is to go Home after hospital stay. Plan for discharge later today if progresses with therapy and meeting goals. Scheduled for OPPT at [facility] in [place]. Follow-up  in the office in 2 weeks.  The PDMP database was reviewed today prior to any opioid medications being prescribed to this patient.   Weston Brass, PA-C Orthopedic Surgery 12/08/2022, 7:31 AM

## 2022-12-08 NOTE — Progress Notes (Signed)
Physical Therapy Treatment Patient Details Name: Anthony Reese MRN: [id number] DOB: March 31, 1971 Today's Date: 12/08/2022   History of Present Illness 52 yo male s/p L TKA and R knee poly revision. PMH: R TKA and revision, obesity, tachycardia, R ACL repair at age 36, CTR, R biceps tendon tear, tobacco use    PT Comments    Pt unable to progress d/t pain; discussed reasoning for incr pain as pt is POD #3, will make PA aware. Pt has essentially ~ flight of stairs to enter home. Pt will need more time to work with PT and on pain control vs non-emergent ambulance transport home.    Recommendations for follow up therapy are one component of a multi-disciplinary discharge planning process, led by the attending physician.  Recommendations may be updated based on patient status, additional functional criteria and insurance authorization.  Follow Up Recommendations       Assistance Recommended at Discharge PRN  Patient can return home with the following A little help with bathing/dressing/bathroom;Assistance with cooking/housework;Assist for transportation;Help with stairs or ramp for entrance   Equipment Recommendations  None recommended by PT    Recommendations for Other Services       Precautions / Restrictions Precautions Precautions: Fall;Knee Precaution Comments: deferred use of L KI to allow flexion for transfers; pt unable to SLR IND'ly on L but demonstrates  good quad activation Required Braces or Orthoses: Knee Immobilizer - Right;Knee Immobilizer - Left Knee Immobilizer - Right: Discontinue once straight leg raise with < 10 degree lag Knee Immobilizer - Left: Discontinue once straight leg raise with < 10 degree lag Restrictions Weight Bearing Restrictions: No Other Position/Activity Restrictions: WBAT     Mobility  Bed Mobility Overal bed mobility: Needs Assistance Bed Mobility: Supine to Sit     Supine to sit: Min assist     General bed mobility comments: incr time,  assist with bil LEs d/t pain    Transfers Overall transfer level: Needs assistance Equipment used: Rolling walker (2 wheels) Transfers: Sit to/from Stand Sit to Stand: Min guard, From elevated surface           General transfer comment: cues for hand placement and LE position    Ambulation/Gait Ambulation/Gait assistance: Min guard, Supervision Gait Distance (Feet): 30 Feet Assistive device: Rolling walker (2 wheels) Gait Pattern/deviations: Step-to pattern, Decreased step length - left, Decreased step length - right, Decreased stance time - left, Antalgic, Decreased stance time - right       General Gait Details: pt reports incr pain, heavy reliance on UEs d/t pain with WBing; 3 standing rests d/t pain   Stairs Stairs: Yes Stairs assistance: Min assist, +2 safety/equipment Stair Management: Backwards, With walker, Step to pattern Number of Stairs: 1 General stair comments: cues for sequence and technique; min assist +2 for safety, cues for technique. unable to ascend >1 step d/t pain   Wheelchair Mobility    Modified Rankin (Stroke Patients Only)       Balance   Sitting-balance support: Feet supported, No upper extremity supported Sitting balance-Leahy Scale: Good     Standing balance support: Reliant on assistive device for balance Standing balance-Leahy Scale: Poor                              Cognition Arousal/Alertness: Awake/alert Behavior During Therapy: WFL for tasks assessed/performed Overall Cognitive Status: Within Functional Limits for tasks assessed  Exercises Total Joint Exercises Quad Sets: AROM, Both, 10 reps Heel Slides: AAROM, Both, 10 reps    General Comments        Pertinent Vitals/Pain Pain Assessment Pain Assessment: 0-10 Pain Score: 4  Pain Location: bil knees, L>R Pain Descriptors / Indicators: Aching, Discomfort, Grimacing, Guarding, Sore Pain  Intervention(s): Limited activity within patient's tolerance, Monitored during session, Premedicated before session, Repositioned    Home Living                          Prior Function            PT Goals (current goals can now be found in the care plan section) Acute Rehab PT Goals Patient Stated Goal: less pain PT Goal Formulation: With patient Time For Goal Achievement: 12/13/22 Potential to Achieve Goals: Good Progress towards PT goals: Progressing toward goals    Frequency    7X/week      PT Plan Current plan remains appropriate    Co-evaluation              AM-PAC PT "6 Clicks" Mobility   Outcome Measure  Help needed turning from your back to your side while in a flat bed without using bedrails?: A Little Help needed moving from lying on your back to sitting on the side of a flat bed without using bedrails?: A Little Help needed moving to and from a bed to a chair (including a wheelchair)?: A Little Help needed standing up from a chair using your arms (e.g., wheelchair or bedside chair)?: A Little Help needed to walk in hospital room?: A Little Help needed climbing 3-5 steps with a railing? : A Little 6 Click Score: 18    End of Session Equipment Utilized During Treatment: Gait belt Activity Tolerance: Patient tolerated treatment well;Patient limited by pain Patient left: in chair;with call bell/phone within reach;with chair alarm set Nurse Communication: Mobility status PT Visit Diagnosis: Other abnormalities of gait and mobility (R26.89);Difficulty in walking, not elsewhere classified (R26.2)     Time: 1610-9604 PT Time Calculation (min) (ACUTE ONLY): 24 min  Charges:  $Gait Training: 23-37 mins                     Katrine Radich, PT  Acute Rehab Dept ([facility]) [phone]  12/08/2022    [facility] 12/08/2022, 10:23 AM

## 2022-12-09 LAB — AEROBIC/ANAEROBIC CULTURE W GRAM STAIN (SURGICAL/DEEP WOUND)

## 2022-12-09 NOTE — Progress Notes (Signed)
Patient with extreme discomfort of Left knee upon transfer back to bed from sitting up all day per patient. MS 2mg  IV, robaxin 500mg  PO, and scheduled neurontin given. No relief per pt. Oxy 10 mg po given @2230 . Pt has bil 2+PP, dsg to L knee intact, at this time no C/O N/T. Leg does not feel hot. -[facility].  Kevan McClung PA-C notified of Patient's concerns and pain . Orders received.

## 2022-12-09 NOTE — Progress Notes (Signed)
Patient with good relief after dose of dilaudid 1mg  IV and toradol 15mg  IV given per new order.

## 2022-12-09 NOTE — Progress Notes (Signed)
   Subjective: 4 Days Post-Op Procedure(s) (LRB): TOTAL KNEE ARTHROPLASTY (Left) Right knee polyethylene revision (Right) Patient reports pain as mild.   Patient seen in rounds by Dr. Lequita Halt. Patient feeling better this AM, pain currently under better control. No issues overnight.   Objective: Vital signs in last 24 hours: Temp:  [97.7 F (36.5 C)-98.4 F (36.9 C)] 97.7 F (36.5 C) (06/07 0555) Pulse Rate:  [83-106] 83 (06/07 0555) Resp:  [17-18] 17 (06/07 0555) BP: (123-145)/(69-89) 125/69 (06/07 0555) SpO2:  [94 %-100 %] 98 % (06/07 0555)  Intake/Output from previous day:  Intake/Output Summary (Last 24 hours) at 12/09/2022 0724 Last data filed at 12/09/2022 0225 Gross per 24 hour  Intake 240 ml  Output 1050 ml  Net -810 ml    Intake/Output this shift: No intake/output data recorded.  Labs: Recent Labs    12/07/22 0324  HGB 14.2   Recent Labs    12/07/22 0324  WBC 14.0*  RBC 4.19*  HCT 41.6  PLT 297   No results for input(s): "NA", "K", "CL", "CO2", "BUN", "CREATININE", "GLUCOSE", "CALCIUM" in the last 72 hours. No results for input(s): "LABPT", "INR" in the last 72 hours.  Exam: General - Patient is Alert and Oriented Extremity - Neurologically intact Neurovascular intact Sensation intact distally Dorsiflexion/Plantar flexion intact Dressing/Incision - clean, dry, no drainage Motor Function - intact, moving foot and toes well on exam.   Past Medical History:  Diagnosis Date   Allergy    Anxiety    Arthritis    Chicken pox    Dysrhythmia    Tachycardia   H/O tobacco use, presenting hazards to health 07/31/2011   Headache, cluster, episodic    none in several years   Injury of left shoulder 01/30/2013   Low testosterone    Nasal polyp 11/18/2013   On home oxygen therapy    ONLY uses for cluster headaches   Other and unspecified hyperlipidemia 11/18/2013   Overweight(278.02)    Oxygen deficiency    Preventative health care 11/18/2013    Radiculopathy affecting upper extremity 11/20/2011   Tobacco use disorder 07/31/2011    Assessment/Plan: 4 Days Post-Op Procedure(s) (LRB): TOTAL KNEE ARTHROPLASTY (Left) Right knee polyethylene revision (Right) Principal Problem:   OA (osteoarthritis) of knee Active Problems:   Osteoarthritis of left knee  Estimated body mass index is 34.36 kg/m as calculated from the following:   Height as of this encounter: 5\' 8"  (1.727 m).   Weight as of this encounter: 102.5 kg. Up with therapy  DVT Prophylaxis - Xarelto Weight-bearing as tolerated  Discharge home around lunch if meeting goals with therapy and pain still well controlled.   Arther Abbott, PA-C Orthopedic Surgery [phone] 12/09/2022, 7:24 AM

## 2022-12-09 NOTE — Progress Notes (Signed)
Patient discharged to home w/ family. Given all belongings, instructions, equipment. Verbalized understanding of all instructions. Escorted to pov via w/c. 

## 2022-12-09 NOTE — Progress Notes (Signed)
Physical Therapy Treatment Patient Details Name: Anthony Reese MRN: [id number] DOB: 1971/01/10 Today's Date: 12/09/2022   History of Present Illness 52 yo male s/p L TKA and R knee poly revision. PMH: R TKA and revision, obesity, tachycardia, R ACL repair at age 31, CTR, R biceps tendon tear, tobacco use    PT Comments    POD # 4 am session Pt OOB in recliner.  Assisted with practicing up/down ONE step back ward with walker.  Then returned to room to perform some TE's following HEP handout.  Instructed on proper tech, freq as well as use of ICE.   Addressed all mobility questions, discussed appropriate activity, educated on use of ICE.  Pt ready for D/C to home.   Recommendations for follow up therapy are one component of a multi-disciplinary discharge planning process, led by the attending physician.  Recommendations may be updated based on patient status, additional functional criteria and insurance authorization.  Follow Up Recommendations       Assistance Recommended at Discharge PRN  Patient can return home with the following A little help with bathing/dressing/bathroom;Assistance with cooking/housework;Assist for transportation;Help with stairs or ramp for entrance   Equipment Recommendations  None recommended by PT    Recommendations for Other Services       Precautions / Restrictions Precautions Precautions: Fall;Knee Precaution Comments: no pillow under knees Required Braces or Orthoses:  (did not use KI this session) Restrictions Weight Bearing Restrictions: No Other Position/Activity Restrictions: WBAT     Mobility  Bed Mobility               General bed mobility comments: OOB in recliner    Transfers Overall transfer level: Needs assistance Equipment used: Rolling walker (2 wheels) Transfers: Sit to/from Stand Sit to Stand: Supervision           General transfer comment: good use of hands to steady self.    Ambulation/Gait Ambulation/Gait  assistance: Supervision Gait Distance (Feet): 40 Feet Assistive device: Rolling walker (2 wheels) Gait Pattern/deviations: Step-to pattern, Decreased step length - left, Decreased step length - right, Decreased stance time - left, Decreased stance time - right Gait velocity: decreased     General Gait Details: improved wt shift to LEs, decr reliance on LEs with  pain more controlled this pm   Stairs Stairs: Yes Stairs assistance: Supervision, Min guard Stair Management: Backwards, With walker, Step to pattern Number of Stairs: 1 General stair comments: cues for sequence and technique; min assist for safety and to stabilize RW   Wheelchair Mobility    Modified Rankin (Stroke Patients Only)       Balance                                            Cognition Arousal/Alertness: Awake/alert Behavior During Therapy: WFL for tasks assessed/performed Overall Cognitive Status: Within Functional Limits for tasks assessed                                 General Comments: AxO x 3 motivated        Exercises  05 reps all seated TE's following HEP handout    General Comments        Pertinent Vitals/Pain Pain Assessment Pain Assessment: 0-10 Pain Score: 5  Pain Location: bil knees, L>R Pain Descriptors / Indicators:  Aching, Discomfort, Grimacing, Guarding, Sore Pain Intervention(s): Monitored during session, Premedicated before session, Repositioned, Ice applied    Home Living                          Prior Function            PT Goals (current goals can now be found in the care plan section) Progress towards PT goals: Progressing toward goals    Frequency    7X/week      PT Plan Current plan remains appropriate    Co-evaluation              AM-PAC PT "6 Clicks" Mobility   Outcome Measure  Help needed turning from your back to your side while in a flat bed without using bedrails?: A Little Help needed  moving from lying on your back to sitting on the side of a flat bed without using bedrails?: A Little Help needed moving to and from a bed to a chair (including a wheelchair)?: A Little Help needed standing up from a chair using your arms (e.g., wheelchair or bedside chair)?: A Little Help needed to walk in hospital room?: A Little Help needed climbing 3-5 steps with a railing? : A Little 6 Click Score: 18    End of Session Equipment Utilized During Treatment: Gait belt Activity Tolerance: Patient tolerated treatment well;Patient limited by pain Patient left: in chair;with call bell/phone within reach;with chair alarm set;with family/visitor present Nurse Communication: Mobility status PT Visit Diagnosis: Other abnormalities of gait and mobility (R26.89);Difficulty in walking, not elsewhere classified (R26.2)     Time: 5409-8119 PT Time Calculation (min) (ACUTE ONLY): 28 min  Charges:  $Gait Training: 8-22 mins $Therapeutic Exercise: 8-22 mins                     {Leasha Goldberger  PTA Acute  [employer] M-F          [phone]

## 2022-12-09 NOTE — Plan of Care (Signed)
  Problem: Activity: Goal: Ability to avoid complications of mobility impairment will improve Outcome: Progressing   

## 2022-12-10 ENCOUNTER — Other Ambulatory Visit: Payer: Self-pay

## 2022-12-10 LAB — AEROBIC/ANAEROBIC CULTURE W GRAM STAIN (SURGICAL/DEEP WOUND): Culture: NO GROWTH

## 2022-12-12 ENCOUNTER — Telehealth: Payer: Self-pay

## 2022-12-12 NOTE — Transitions of Care (Post Inpatient/ED Visit) (Unsigned)
   12/12/2022  Name: Anthony Reese MRN: [id number] DOB: 03/27/1971  Today's TOC FU Call Status: Today's TOC FU Call Status:: Unsuccessul Call (1st Attempt) Unsuccessful Call (1st Attempt) Date: 12/12/22  Attempted to reach the patient regarding the most recent Inpatient/ED visit.  Follow Up Plan: Additional outreach attempts will be made to reach the patient to complete the Transitions of Care (Post Inpatient/ED visit) call.   Signature Karena Addison, LPN [facility] Nurse Health Advisor Direct Dial [phone]

## 2022-12-12 NOTE — Discharge Summary (Signed)
Patient ID: Anthony Reese MRN: [id number] DOB/AGE: [birth year] 52 y.o.  Admit date: 12/05/2022 Discharge date: 12/09/2022  Admission Diagnoses:  Principal Problem:   OA (osteoarthritis) of knee Active Problems:   Osteoarthritis of left knee   Discharge Diagnoses:  Same  Past Medical History:  Diagnosis Date   Allergy    Anxiety    Arthritis    Chicken pox    Dysrhythmia    Tachycardia   H/O tobacco use, presenting hazards to health 07/31/2011   Headache, cluster, episodic    none in several years   Injury of left shoulder 01/30/2013   Low testosterone    Nasal polyp 11/18/2013   On home oxygen therapy    ONLY uses for cluster headaches   Other and unspecified hyperlipidemia 11/18/2013   Overweight(278.02)    Oxygen deficiency    Preventative health care 11/18/2013   Radiculopathy affecting upper extremity 11/20/2011   Tobacco use disorder 07/31/2011    Surgeries: Procedure(s): TOTAL KNEE ARTHROPLASTY Right knee polyethylene revision on 12/05/2022   Consultants:   Discharged Condition: Improved  Hospital Course: Anthony Reese is an 52 y.o. male who was admitted 12/05/2022 for operative treatment ofOA (osteoarthritis) of knee. Patient has severe unremitting pain that affects sleep, daily activities, and work/hobbies. After pre-op clearance the patient was taken to the operating room on 12/05/2022 and underwent  Procedure(s): TOTAL KNEE ARTHROPLASTY Right knee polyethylene revision.    Patient was given perioperative antibiotics:  Anti-infectives (From admission, onward)    Start     Dose/Rate Route Frequency Ordered Stop   12/05/22 2100  ceFAZolin (ANCEF) IVPB 2g/100 mL premix        2 g 200 mL/hr over 30 Minutes Intravenous Every 6 hours 12/05/22 1946 12/06/22 0415   12/05/22 1200  ceFAZolin (ANCEF) IVPB 2g/100 mL premix        2 g 200 mL/hr over 30 Minutes Intravenous On call to O.R. 12/05/22 1154 12/05/22 1525        Patient was given sequential compression  devices, early ambulation, and chemoprophylaxis to prevent DVT.  Patient benefited maximally from hospital stay and there were no complications.    Recent vital signs: No data found.   Recent laboratory studies: No results for input(s): "WBC", "HGB", "HCT", "PLT", "NA", "K", "CL", "CO2", "BUN", "CREATININE", "GLUCOSE", "INR", "CALCIUM" in the last 72 hours.  Invalid input(s): "PT", "2"   Discharge Medications:   Allergies as of 12/09/2022       Reactions   Imitrex [sumatriptan] Anaphylaxis   Swelling around eyes and shortness of breath   Verapamil Anaphylaxis   Swelling around eyes and shortness of breath   Penicillins Other (See Comments)   Unknown reaction as a child. Tolerated Cephalosporin Date: 12/07/22.        Medication List     STOP taking these medications    rosuvastatin 40 MG tablet Commonly known as: CRESTOR       TAKE these medications    cetirizine 10 MG tablet Commonly known as: ZYRTEC Take 10 mg by mouth daily.   fluticasone 50 MCG/ACT nasal spray Commonly known as: FLONASE Place 2 sprays into both nostrils daily as needed for allergies or rhinitis.   gabapentin 300 MG capsule Commonly known as: NEURONTIN Take a 300 mg capsule three times a day for two weeks following surgery.Then take a 300 mg capsule two times a day for two weeks. Then take a 300 mg capsule once a day for two weeks. Then discontinue.   methocarbamol  500 MG tablet Commonly known as: ROBAXIN Take 1 tablet (500 mg total) by mouth every 6 (six) hours as needed for muscle spasms.   oxyCODONE 5 MG immediate release tablet Commonly known as: Oxy IR/ROXICODONE Take 1-2 tablets (5-10 mg total) by mouth every 6 (six) hours as needed for severe pain.   rivaroxaban 10 MG Tabs tablet Commonly known as: XARELTO Take 1 tablet (10 mg total) by mouth daily for 19 days. Then take one 81 mg aspirin once a day for three weeks. Then discontinue aspirin.   testosterone cypionate 200 MG/ML  injection Commonly known as: DEPOTESTOSTERONE CYPIONATE Inject 1 mL (200 mg total) into the muscle every 14 (fourteen) days.   traMADol 50 MG tablet Commonly known as: ULTRAM Take 1-2 tablets (50-100 mg total) by mouth every 6 (six) hours as needed for moderate pain.               Discharge Care Instructions  (From admission, onward)           Start     Ordered   12/07/22 0000  Weight bearing as tolerated        12/07/22 0749   12/07/22 0000  Change dressing       Comments: You may remove the bulky bandage (ACE wrap and gauze) two days after surgery. You will have an adhesive waterproof bandage underneath. Leave this in place until your first follow-up appointment.   12/07/22 0749            Diagnostic Studies: DG Chest 2 View  Result Date: 11/14/2022 CLINICAL DATA:  Preop evaluation. Preop for bilateral total knee arthroplasty. EXAM: CHEST - 2 VIEW COMPARISON:  Chest radiograph 02/08/2016 FINDINGS: The cardiomediastinal contours are stable. The lungs are clear. Pulmonary vasculature is normal. No consolidation, pleural effusion, or pneumothorax. Mild thoracic spondylosis with anterior spurring. No acute osseous abnormalities are seen. IMPRESSION: No acute chest findings. Electronically Signed   By: Narda Rutherford M.D.   On: 11/14/2022 18:39    Disposition: Discharge disposition: 01-Home or Self Care       Discharge Instructions     Call MD / Call 911   Complete by: As directed    If you experience chest pain or shortness of breath, CALL 911 and be transported to the hospital emergency room.  If you develope a fever above 101 F, pus (white drainage) or increased drainage or redness at the wound, or calf pain, call your surgeon's office.   Change dressing   Complete by: As directed    You may remove the bulky bandage (ACE wrap and gauze) two days after surgery. You will have an adhesive waterproof bandage underneath. Leave this in place until your first  follow-up appointment.   Constipation Prevention   Complete by: As directed    Drink plenty of fluids.  Prune juice may be helpful.  You may use a stool softener, such as Colace (over the counter) 100 mg twice a day.  Use MiraLax (over the counter) for constipation as needed.   Diet - low sodium heart healthy   Complete by: As directed    Do not put a pillow under the knee. Place it under the heel.   Complete by: As directed    Driving restrictions   Complete by: As directed    No driving for two weeks   Post-operative opioid taper instructions:   Complete by: As directed    POST-OPERATIVE OPIOID TAPER INSTRUCTIONS: It is important to wean off  of your opioid medication as soon as possible. If you do not need pain medication after your surgery it is ok to stop day one. Opioids include: Codeine, Hydrocodone(Norco, Vicodin), Oxycodone(Percocet, oxycontin) and hydromorphone amongst others.  Long term and even short term use of opiods can cause: Increased pain response Dependence Constipation Depression Respiratory depression And more.  Withdrawal symptoms can include Flu like symptoms Nausea, vomiting And more Techniques to manage these symptoms Hydrate well Eat regular healthy meals Stay active Use relaxation techniques(deep breathing, meditating, yoga) Do Not substitute Alcohol to help with tapering If you have been on opioids for less than two weeks and do not have pain than it is ok to stop all together.  Plan to wean off of opioids This plan should start within one week post op of your joint replacement. Maintain the same interval or time between taking each dose and first decrease the dose.  Cut the total daily intake of opioids by one tablet each day Next start to increase the time between doses. The last dose that should be eliminated is the evening dose.      TED hose   Complete by: As directed    Use stockings (TED hose) for three weeks on both leg(s).  You may  remove them at night for sleeping.   Weight bearing as tolerated   Complete by: As directed         Follow-up Information     Aluisio, Homero Fellers, MD Follow up in 2 week(s).   Specialty: Orthopedic Surgery Contact information: [address] [place] [address] [phone]                  Signed: Arther Abbott 12/12/2022, 3:43 PM

## 2022-12-13 NOTE — Transitions of Care (Post Inpatient/ED Visit) (Signed)
12/13/2022  Name: Anthony Reese MRN: [id number] DOB: November 01, 1970  Today's TOC FU Call Status: Today's TOC FU Call Status:: Successful TOC FU Call Competed Unsuccessful Call (1st Attempt) Date: 12/12/22 [facility] FU Call Complete Date: 12/13/22  Transition Care Management Follow-up Telephone Call Date of Discharge: 12/09/22 Discharge Facility: [facility]) Type of Discharge: Inpatient Admission Primary Inpatient Discharge Diagnosis:: left knee osteoarthritis How have you been since you were released from the hospital?: Better Any questions or concerns?: No  Items Reviewed: Did you receive and understand the discharge instructions provided?: Yes Medications obtained,verified, and reconciled?: Yes (Medications Reviewed) Any new allergies since your discharge?: No Dietary orders reviewed?: Yes Do you have support at home?: Yes People in Home: spouse  Medications Reviewed Today: Medications Reviewed Today     Reviewed by Karena Addison, LPN (Licensed Practical Nurse) on 12/13/22 at 1544  Med List Status: <None>   Medication Order Taking? Sig Documenting Provider Last Dose Status Informant  cetirizine (ZYRTEC) 10 MG tablet [id number] No Take 10 mg by mouth daily. [provider] 12/05/2022 Active Self  fluticasone (FLONASE) 50 MCG/ACT nasal spray [id number] No Place 2 sprays into both nostrils daily as needed for allergies or rhinitis. [provider] More than a month Active Self  gabapentin (NEURONTIN) 300 MG capsule [id number]  Take a 300 mg capsule three times a day for two weeks following surgery.Then take a 300 mg capsule two times a day for two weeks. Then take a 300 mg capsule once a day for two weeks. Then discontinue. Edmisten, Lyn Hollingshead, PA  Active   methocarbamol (ROBAXIN) 500 MG tablet [id number]  Take 1 tablet (500 mg total) by mouth every 6 (six) hours as needed for muscle spasms. Edmisten, Kristie L, PA  Active   oxyCODONE (OXY IR/ROXICODONE) 5 MG immediate  release tablet [id number]  Take 1-2 tablets (5-10 mg total) by mouth every 6 (six) hours as needed for severe pain. Edmisten, Lyn Hollingshead, PA  Active   rivaroxaban (XARELTO) 10 MG TABS tablet [id number]  Take 1 tablet (10 mg total) by mouth daily for 19 days. Then take one 81 mg aspirin once a day for three weeks. Then discontinue aspirin. Edmisten, Lyn Hollingshead, PA  Active   testosterone cypionate (DEPOTESTOSTERONE CYPIONATE) 200 MG/ML injection [id number] No Inject 1 mL (200 mg total) into the muscle every 14 (fourteen) days. Bradd Canary, MD Taking Active Self           Med Note Reuben Likes Nov 21, 2022  3:11 PM) On hold for procedure  traMADol (ULTRAM) 50 MG tablet [id number]  Take 1-2 tablets (50-100 mg total) by mouth every 6 (six) hours as needed for moderate pain. Edmisten, Lyn Hollingshead, PA  Active             Home Care and Equipment/Supplies: Were Home Health Services Ordered?: NA Any new equipment or medical supplies ordered?: NA  Functional Questionnaire: Do you need assistance with bathing/showering or dressing?: No Do you need assistance with meal preparation?: No Do you need assistance with eating?: No Do you have difficulty maintaining continence: No Do you need assistance with getting out of bed/getting out of a chair/moving?: No Do you have difficulty managing or taking your medications?: No  Follow up appointments reviewed: PCP Follow-up appointment confirmed?: NA Specialist Hospital Follow-up appointment confirmed?: Yes Date of Specialist follow-up appointment?: 12/19/22 Follow-Up Specialty Provider:: Aluisio Do you need transportation to your follow-up appointment?: No Do you understand care options if  your condition(s) worsen?: Yes-patient verbalized understanding    SIGNATURE Karena Addison, LPN [facility] Nurse Health Advisor Direct Dial [phone]

## 2023-02-10 ENCOUNTER — Other Ambulatory Visit (HOSPITAL_BASED_OUTPATIENT_CLINIC_OR_DEPARTMENT_OTHER): Payer: Self-pay

## 2023-02-10 ENCOUNTER — Other Ambulatory Visit: Payer: Self-pay

## 2023-02-10 MED ORDER — HYDROCODONE-ACETAMINOPHEN 5-325 MG PO TABS
1.0000 | ORAL_TABLET | Freq: Two times a day (BID) | ORAL | 0 refills | Status: DC | PRN
  Filled 2023-02-10: qty 60, 30d supply, fill #0

## 2023-02-11 ENCOUNTER — Other Ambulatory Visit: Payer: Self-pay | Admitting: Family Medicine

## 2023-02-11 DIAGNOSIS — E349 Endocrine disorder, unspecified: Secondary | ICD-10-CM

## 2023-02-13 NOTE — Telephone Encounter (Signed)
Requesting: Testosterone 200mg  Contract: No UDS: No Last Visit: 01/18/2022 with PCP, Preop on 11/14/2022.  Next Visit: Visit date not found Last Refill: 07/19/2022, 12ml x 1 RF  Please Advise

## 2023-03-07 ENCOUNTER — Other Ambulatory Visit (HOSPITAL_COMMUNITY): Payer: Self-pay

## 2023-05-15 ENCOUNTER — Encounter: Payer: Self-pay | Admitting: Family Medicine

## 2023-05-15 DIAGNOSIS — E349 Endocrine disorder, unspecified: Secondary | ICD-10-CM

## 2023-05-18 ENCOUNTER — Telehealth: Payer: Self-pay

## 2023-05-18 NOTE — Telephone Encounter (Signed)
PA initiated via Covermymeds; KEY:  [id number]. Awaiting determination.

## 2023-05-18 NOTE — Telephone Encounter (Signed)
PA approved.   CaseId:[id number];Status:Approved;Review Type:Prior Auth;Coverage Start Date:04/18/2023;Coverage End Date:05/17/2024; Authorization Expiration Date: 05/16/2024

## 2023-05-22 ENCOUNTER — Other Ambulatory Visit: Payer: Self-pay | Admitting: Family Medicine

## 2023-05-22 DIAGNOSIS — E349 Endocrine disorder, unspecified: Secondary | ICD-10-CM

## 2023-05-22 MED ORDER — TESTOSTERONE CYPIONATE 200 MG/ML IM SOLN
200.0000 mg | INTRAMUSCULAR | 0 refills | Status: DC
Start: 2023-05-22 — End: 2023-08-04

## 2023-05-22 NOTE — Telephone Encounter (Signed)
Requesting: testosterone  Contract: n/a UDS: n/a  Last Visit: 11/14/22 w/ Efraim Kaufmann Next Visit: None Last Refill: 02/13/23 #62mL and 0RF   Please Advise

## 2023-05-23 NOTE — Telephone Encounter (Signed)
Called patient. No answer. Mailbox was full. Could not leave a message.

## 2023-08-04 ENCOUNTER — Encounter: Payer: Self-pay | Admitting: Family Medicine

## 2023-08-04 ENCOUNTER — Other Ambulatory Visit: Payer: Self-pay | Admitting: Family Medicine

## 2023-08-04 DIAGNOSIS — E349 Endocrine disorder, unspecified: Secondary | ICD-10-CM

## 2023-08-04 NOTE — Telephone Encounter (Signed)
Requesting: testosterone  Contract: n.a UDS: n/a Last Visit: 11/14/22 w/ Efraim Kaufmann Next Visit: None Last Refill: 05/22/23 #64mL and 0RF  Please Advise

## 2023-08-05 MED ORDER — TESTOSTERONE CYPIONATE 200 MG/ML IM SOLN
200.0000 mg | INTRAMUSCULAR | 0 refills | Status: DC
Start: 2023-08-05 — End: 2023-11-21

## 2023-08-29 ENCOUNTER — Encounter: Payer: Self-pay | Admitting: Dermatology

## 2023-08-29 ENCOUNTER — Ambulatory Visit: Payer: BC Managed Care – PPO | Admitting: Dermatology

## 2023-08-29 VITALS — BP 125/88

## 2023-08-29 DIAGNOSIS — L720 Epidermal cyst: Secondary | ICD-10-CM

## 2023-08-29 DIAGNOSIS — L821 Other seborrheic keratosis: Secondary | ICD-10-CM

## 2023-08-29 DIAGNOSIS — W908XXA Exposure to other nonionizing radiation, initial encounter: Secondary | ICD-10-CM

## 2023-08-29 DIAGNOSIS — L814 Other melanin hyperpigmentation: Secondary | ICD-10-CM

## 2023-08-29 DIAGNOSIS — L578 Other skin changes due to chronic exposure to nonionizing radiation: Secondary | ICD-10-CM

## 2023-08-29 DIAGNOSIS — D1801 Hemangioma of skin and subcutaneous tissue: Secondary | ICD-10-CM | POA: Diagnosis not present

## 2023-08-29 DIAGNOSIS — D1721 Benign lipomatous neoplasm of skin and subcutaneous tissue of right arm: Secondary | ICD-10-CM

## 2023-08-29 DIAGNOSIS — Z1283 Encounter for screening for malignant neoplasm of skin: Secondary | ICD-10-CM | POA: Diagnosis not present

## 2023-08-29 DIAGNOSIS — D229 Melanocytic nevi, unspecified: Secondary | ICD-10-CM

## 2023-08-29 DIAGNOSIS — L729 Follicular cyst of the skin and subcutaneous tissue, unspecified: Secondary | ICD-10-CM

## 2023-08-29 NOTE — Patient Instructions (Signed)

## 2023-08-29 NOTE — Progress Notes (Signed)
   New Patient Visit   Subjective  Anthony Reese is a 53 y.o. male who presents for the following: Skin Cancer Screening and Full Body Skin Exam - No history of skin cancer. No family history of Melanoma  The patient presents for Total-Body Skin Exam (TBSE) for skin cancer screening and mole check. The patient has spots, moles and lesions to be evaluated, some may be new or changing and the patient may have concern these could be cancer.    The following portions of the chart were reviewed this encounter and updated as appropriate: medications, allergies, medical history  Review of Systems:  No other skin or systemic complaints except as noted in HPI or Assessment and Plan.  Objective  Well appearing patient in no apparent distress; mood and affect are within normal limits.  A full examination was performed including scalp, head, eyes, ears, nose, lips, neck, chest, axillae, abdomen, back, buttocks, bilateral upper extremities, bilateral lower extremities, hands, feet, fingers, toes, fingernails, and toenails. All findings within normal limits unless otherwise noted below.   Relevant physical exam findings are noted in the Assessment and Plan.    Assessment & Plan   SKIN CANCER SCREENING PERFORMED TODAY.  ACTINIC DAMAGE - Chronic condition, secondary to cumulative UV/sun exposure - diffuse scaly erythematous macules with underlying dyspigmentation - Recommend daily broad spectrum sunscreen SPF 30+ to sun-exposed areas, reapply every 2 hours as needed.  - Staying in the shade or wearing long sleeves, sun glasses (UVA+UVB protection) and wide brim hats (4-inch brim around the entire circumference of the hat) are also recommended for sun protection.  - Call for new or changing lesions.  LENTIGINES, SEBORRHEIC KERATOSES, HEMANGIOMAS - Benign normal skin lesions - Benign-appearing - Call for any changes  MELANOCYTIC NEVI - Tan-brown and/or pink-flesh-colored symmetric macules and  papules - Benign appearing on exam today - Observation - Call clinic for new or changing moles - Recommend daily use of broad spectrum spf 30+ sunscreen to sun-exposed areas.   Lipoma  Exam: Subcutaneous rubbery nodule(s) Location: Right shoulder  Benign-appearing. Exam most consistent with a lipoma. Discussed that a lipoma is a benign fatty growth that can grow over time and sometimes get irritated. Recommend observation if it is not bothersome or changing. Discussed option of ILK injections or surgical excision to remove it if it is growing, symptomatic, or other changes noted. Please call for new or changing lesions so they can be evaluated.  EPIDERMAL INCLUSION CYST Exam: Subcutaneous nodule at right shoulder  Benign-appearing. Exam most consistent with an epidermal inclusion cyst. Discussed that a cyst is a benign growth that can grow over time and sometimes get irritated or inflamed. Recommend observation if it is not bothersome. Discussed option of surgical excision to remove it if it is growing, symptomatic, or other changes noted. Please call for new or changing lesions so they can be evaluated.       Return in about 1 year (around 08/28/2024) for TBSE.  I, Joanie Coddington, CMA, am acting as scribe for [employer], DO .   Documentation: I have reviewed the above documentation for accuracy and completeness, and I agree with the above.  Langston Reusing, DO

## 2023-10-24 ENCOUNTER — Other Ambulatory Visit: Payer: Self-pay | Admitting: Physical Medicine and Rehabilitation

## 2023-10-24 DIAGNOSIS — M5459 Other low back pain: Secondary | ICD-10-CM

## 2023-11-04 ENCOUNTER — Other Ambulatory Visit

## 2023-11-08 ENCOUNTER — Ambulatory Visit
Admission: RE | Admit: 2023-11-08 | Discharge: 2023-11-08 | Disposition: A | Discharge status: Home / Self Care | Source: Ambulatory Visit | Attending: Physical Medicine and Rehabilitation | Admitting: Physical Medicine and Rehabilitation

## 2023-11-08 DIAGNOSIS — M5459 Other low back pain: Secondary | ICD-10-CM

## 2023-11-20 ENCOUNTER — Encounter: Payer: Self-pay | Admitting: Family Medicine

## 2023-11-20 DIAGNOSIS — E349 Endocrine disorder, unspecified: Secondary | ICD-10-CM

## 2023-11-20 DIAGNOSIS — R718 Other abnormality of red blood cells: Secondary | ICD-10-CM

## 2023-11-21 MED ORDER — TESTOSTERONE CYPIONATE 200 MG/ML IM SOLN
200.0000 mg | INTRAMUSCULAR | 0 refills | Status: DC
Start: 2023-11-21 — End: 2024-01-22

## 2023-11-29 ENCOUNTER — Encounter: Payer: Self-pay | Admitting: Family Medicine

## 2023-12-08 ENCOUNTER — Encounter: Payer: Self-pay | Admitting: Family Medicine

## 2023-12-08 ENCOUNTER — Ambulatory Visit: Admitting: Family Medicine

## 2023-12-08 VITALS — BP 123/84 | HR 96 | Ht 68.0 in | Wt 218.0 lb

## 2023-12-08 DIAGNOSIS — Z01818 Encounter for other preprocedural examination: Secondary | ICD-10-CM | POA: Diagnosis not present

## 2023-12-08 LAB — EKG 12-LEAD

## 2023-12-08 NOTE — Progress Notes (Signed)
 Subjective:      HPI: Pt is a 53 y.o. male who is here for preoperative clearance for  L5 Transforaminal lumbar interbody fusion with [facility], Dr. Vaughn Georges. Tentatively scheduled for July.   1) High Risk Cardiac Conditions:  1) Recent MI - No.  2) Decompensated Heart Failure - No.  3) Unstable angina - No.  4) Symptomatic arrythmia - No.  5) Sx Valvular Disease - No.  2) Intermediate Risk Factors: DM, CKD, CVA, CHF, CAD - No.  2) Functional Status: > 4 mets (Walk, run, climb stairs) Yes.  Anthony Reese: 58.2  3) Surgery Specific Risk: High (Emergency, Vascular, Intra-abdominal, Extensive ops)          Intermediate (Carotid, Head and Neck, Orthopaedic )          Low (Endoscopic, Cataract, Breast )  4) Further Noninvasive evaluation:   1) EKG - SR 87 bpm, no ischemic changes     2) Echo - n/a     3) Stress Testing - n/a   4) CXR - n/a     5)PFTs - n/a     5) Need for medical therapy - no  New complaints: none  Social history:  Relevant past medical, surgical, family and social history reviewed and updated as indicated. Interim medical history since our last visit reviewed.  Allergies and medications reviewed and updated.  DATA REVIEWED: CHART IN EPIC  ROS: Negative unless specifically indicated above in HPI.    Current Outpatient Medications:    HYDROcodone -acetaminophen  (NORCO/VICODIN) 5-325 MG tablet, Take 1 tablet by mouth 2 (two) times daily as needed for pain., Disp: 60 tablet, Rfl: 0   testosterone  cypionate (DEPOTESTOSTERONE CYPIONATE) 200 MG/ML injection, Inject 1 mL (200 mg total) into the muscle every 14 (fourteen) days., Disp: 4 mL, Rfl: 0      Objective:     BP 123/84   Pulse 96   Ht 5\' 8"  (1.727 m)   Wt 218 lb (98.9 kg)   SpO2 97%   BMI 33.15 kg/m   Wt Readings from Last 3 Encounters:  12/08/23 218 lb (98.9 kg)  12/05/22 226 lb (102.5 kg)  11/23/22 226 lb (102.5 kg)    Physical Exam Vitals reviewed.   Constitutional:      Appearance: Normal appearance.  Cardiovascular:     Rate and Rhythm: Normal rate and regular rhythm.     Heart sounds: Normal heart sounds.  Pulmonary:     Effort: Pulmonary effort is normal.     Breath sounds: Normal breath sounds.  Musculoskeletal:     Right lower leg: No edema.     Left lower leg: No edema.  Skin:    General: Skin is warm and dry.  Neurological:     Mental Status: He is alert and oriented to person, place, and time.  Psychiatric:        Mood and Affect: Mood normal.        Behavior: Behavior normal.        Thought Content: Thought content normal.        Judgment: Judgment normal.          Assessment & Plan:  Preop testing -     EKG 12-Lead    I have independently evaluated patient.  Anthony Reese is a 53 y.o. male who is low risk for an intermediate risk surgery.  There are not modifiable risk factors (smoking, etc). Quit smoking a few weeks ago.  [name] RCRI/NSQIP calculation for MACE is: 0.      Return if symptoms worsen or fail to improve.  Anthony Cristal Kerrie Peek, DNP, FNP-C

## 2023-12-14 ENCOUNTER — Ambulatory Visit: Admitting: Family Medicine

## 2023-12-15 ENCOUNTER — Ambulatory Visit (HOSPITAL_COMMUNITY): Payer: Self-pay | Admitting: Physician Assistant

## 2023-12-18 ENCOUNTER — Ambulatory Visit (HOSPITAL_COMMUNITY): Payer: Self-pay | Admitting: Physician Assistant

## 2023-12-18 NOTE — H&P (Signed)
 HPI:  The patient is a 53 year old male who presents for pre-operative visit in preparation for their L4-5 TLIF, which is scheduled on 01/01/24 with Dr. Mort Ards, MD at [facility]. The patient has had symptoms in the Back including pain which has impacted their quality of life and ability to do activities of daily living. The patient currently has a diagnosis of lumbar radiculoapthy {{}} and has failed conservative treatments including acitivty modification; PT. The patient has had no previous surgery . The patient denies an active infection.  ROS:  Constitutional: Constitutional: no fever, chills, night sweats, or significant weight loss.  Cardiovascular: Cardiovascular: no palpitations or chest pain.  Respiratory: Respiratory: no cough or shortness of breath and No COPD.  Gastrointestinal: Gastrointestinal: no vomiting or nausea.  Musculoskeletal: Musculoskeletal: no swelling in Joints, Joint Pain, and back pain.  Neurologic: Neurologic: no numbness, tingling, or difficulty with balance.  ROS as noted in the HPI  Physical Exam:  Clinical exam: Anthony Reese is a pleasant individual, who appears younger than their stated age.  He is alert and orientated 3.  No shortness of breath, chest pain.  Abdomen is soft and non-tender, negative loss of bowel and bladder control, no rebound tenderness.  Negative: skin lesions abrasions contusions  Peripheral pulses: 2+ peripheral pulses bilaterally. LE compartments are: Soft and nontender.  Gait pattern: Altered gait pattern due to back buttock and neuropathic right leg pain  Assistive devices: None  Neuro: Positive decreased sensation light touch along with dysesthesias and neuropathic pain in the right L5 dermatome. Negative straight leg raise test, no clonus, negative Babinski test. 5/5 motor strength in the lower extremity except for trace weakness of the right EHL/tibialis anterior. Symmetrical 1+ deep tendon  reflexes.  Musculoskeletal: Moderate to severe low back pain in the right paraspinal region radiating into the right lower extremity. No SI joint pain, no hip, knee, ankle pain with isolated joint range of motion. Pain is intensified with forward flexion and rotation.  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 Loss of normal disc height is noted. No scoliosis. There is improvement in the spondylolisthesis from grade 2 to grade 1 with extension of the spine. Further progression of the slip with forward flexion.  Lumbar MRI: completed on 11/08/2023 was reviewed with the patient. It was completed at [facility]; I have independently reviewed the images as well as the radiology report. 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 Severe bilateral neuroforaminal narrowing. Positive Modic endplate changes are noted. No significant pathology at the remainder of the levels.  Assessment/ Plan:   Patient has failed conservative management, therefore we have elected to proceed with the following surgical procedure TLIF L4-L5 on 01/01/2024 at [facility] by Dr. Vaughn Georges.  The patient has not had any improvement of their symptoms with conservative treatment measures. Anthony Reese has had progressive back and leg pain. He has failed tx options such as medical management and injections.   On exam, he does have an abnormal gait due to his neuropathic right leg pain. In the right :L5 dermatome he has radicular leg pain and decreased sensation. 5/5 motor strength in the lower extremity except for trace weakness of the right EHL/tibialis anterior. Symmetrical 1+ deep tendon reflexes.  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 Loss of normal disc height is  noted.  No scoliosis. There is improvement in the spondylolisthesis from grade 2 to grade 1 with extension of the spine. Further progression of the slip with forward flexion.  Lumbar MRI: completed on 11/08/2023 was reviewed with the patient. It was completed at [facility]; I have independently reviewed the images as well as the radiology report. 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 Severe bilateral neuroforaminal narrowing. Positive Modic endplate changes are noted. No significant pathology at the remainder of the levels.  Based on clinical exam findings and imaging studies we do believe that surgery is warranted at this time in order to decrease pain and loss of quality of life.  The surgical intervention that we would recommend would be TLIF L4-L5 The risks of surgery were discussed today with the patient, below are the surgical risks we did discuss. Risks and benefits of surgery were discussed with the patient. These include: Infection, bleeding, death, stroke, paralysis, ongoing or worse pain, need for additional surgery, nonunion, leak of spinal fluid, adjacent segment degeneration requiring additional fusion surgery, Injury to abdominal vessels that can require anterior surgery to stop bleeding. Malposition of the cage and/or pedicle screws that could require additional surgery. Loss of bowel and bladder control. Postoperative hematoma causing neurologic compression that could require urgent or emergent re-operation.  Goals of surgery: Reduced (not eliminated) pain, and improved quality of life.  Diagnosis: Lumbar Radiculopathy    Post-operative care plans were discussed with the patient today and all patient questions were answered. Therapy Plans: outpatient therapy Disposition: home  Planned DVT Prophylaxis: TEDS, SCDs DME needed: [facility] PCP: Daril Edge TXA: yes Allergies: penicillin, sumatriptan--anaphylaxis; verapamil;  imitrex Anesthesia Concerns: none  BMI:31.9 Other: Wife Candice Chalet) will be there day of surgery  Instructed patient on which medications to discontinue 5 days prior to surgery.  Will follow-up in office with Dr. Vaughn Georges in 1-2 weeks post-op.

## 2023-12-19 NOTE — Progress Notes (Signed)
 Surgical Instructions   Your procedure is scheduled on January 01, 2024. Report to [facility] Main Entrance A at 5:30 A.M., then check in with the Admitting office. Any questions or running late day of surgery: call [phone]  Questions prior to your surgery date: call [phone], Monday-Friday, 8am-4pm. If you experience any cold or flu symptoms such as cough, fever, chills, shortness of breath, etc. between now and your scheduled surgery, please notify us  at the above number.     Remember:  Do not eat after midnight the night before your surgery  You may drink clear liquids until 4:30 the morning of your surgery.   Clear liquids allowed are: Water , Non-Citrus Juices (without pulp), Carbonated Beverages, Clear Tea (no milk, honey, etc.), Black Coffee Only (NO MILK, CREAM OR POWDERED CREAMER of any kind), and Gatorade.    Take these medicines the morning of surgery with A SIP OF WATER     May take these medicines IF NEEDED: cetirizine  (ZYRTEC )  HYDROcodone -acetaminophen  (NORCO/VICODIN)   One week prior to surgery, STOP taking any Aspirin  (unless otherwise instructed by your surgeon) Aleve, Naproxen, Ibuprofen, Motrin, Advil, Goody's, BC's, all herbal medications, fish oil, and non-prescription vitamins.                     Do NOT Smoke (Tobacco/Vaping) for 24 hours prior to your procedure.  If you use a CPAP at night, you may bring your mask/headgear for your overnight stay.   You will be asked to remove any contacts, glasses, piercing's, hearing aid's, dentures/partials prior to surgery. Please bring cases for these items if needed.    Patients discharged the day of surgery will not be allowed to drive home, and someone needs to stay with them for 24 hours.  SURGICAL WAITING ROOM VISITATION Patients may have no more than 2 support people in the waiting area - these visitors may rotate.   Pre-op nurse will coordinate an appropriate time for 1 ADULT support person, who may not  rotate, to accompany patient in pre-op.  Children under the age of 33 must have an adult with them who is not the patient and must remain in the main waiting area with an adult.  If the patient needs to stay at the hospital during part of their recovery, the visitor guidelines for inpatient rooms apply.  Please refer to the [facility] website for the visitor guidelines for any additional information.   If you received a COVID test during your pre-op visit  it is requested that you wear a mask when out in public, stay away from anyone that may not be feeling well and notify your surgeon if you develop symptoms. If you have been in contact with anyone that has tested positive in the last 10 days please notify you surgeon.      Pre-operative 5 CHG Bathing Instructions   You can play a key role in reducing the risk of infection after surgery. Your skin needs to be as free of germs as possible. You can reduce the number of germs on your skin by washing with CHG (chlorhexidine  gluconate) soap before surgery. CHG is an antiseptic soap that kills germs and continues to kill germs even after washing.   DO NOT use if you have an allergy to chlorhexidine /CHG or antibacterial soaps. If your skin becomes reddened or irritated, stop using the CHG and notify one of our RNs at [phone].   Please shower with the CHG soap starting 4 days before surgery  using the following schedule:     Please keep in mind the following:  DO NOT shave, including legs and underarms, starting the day of your first shower.   You may shave your face at any point before/day of surgery.  Place clean sheets on your bed the day you start using CHG soap. Use a clean washcloth (not used since being washed) for each shower. DO NOT sleep with pets once you start using the CHG.   CHG Shower Instructions:  Wash your face and private area with normal soap. If you choose to wash your hair, wash first with your normal shampoo.   After you use shampoo/soap, rinse your hair and body thoroughly to remove shampoo/soap residue.  Turn the water  OFF and apply about 3 tablespoons (45 ml) of CHG soap to a CLEAN washcloth.  Apply CHG soap ONLY FROM YOUR NECK DOWN TO YOUR TOES (washing for 3-5 minutes)  DO NOT use CHG soap on face, private areas, open wounds, or sores.  Pay special attention to the area where your surgery is being performed.  If you are having back surgery, having someone wash your back for you may be helpful. Wait 2 minutes after CHG soap is applied, then you may rinse off the CHG soap.  Pat dry with a clean towel  Put on clean clothes/pajamas   If you choose to wear lotion, please use ONLY the CHG-compatible lotions that are listed below.  Additional instructions for the day of surgery: DO NOT APPLY any lotions, deodorants, cologne, or perfumes.   Do not bring valuables to the hospital. [facility] is not responsible for any belongings/valuables. Do not wear nail polish, gel polish, artificial nails, or any other type of covering on natural nails (fingers and toes) Do not wear jewelry or makeup Put on clean/comfortable clothes.  Please brush your teeth.  Ask your nurse before applying any prescription medications to the skin.     CHG Compatible Lotions   Aveeno Moisturizing lotion  Cetaphil Moisturizing Cream  Cetaphil Moisturizing Lotion  Clairol Herbal Essence Moisturizing Lotion, Dry Skin  Clairol Herbal Essence Moisturizing Lotion, Extra Dry Skin  Clairol Herbal Essence Moisturizing Lotion, Normal Skin  Curel Age Defying Therapeutic Moisturizing Lotion with Alpha Hydroxy  Curel Extreme Care Body Lotion  Curel Soothing Hands Moisturizing Hand Lotion  Curel Therapeutic Moisturizing Cream, Fragrance-Free  Curel Therapeutic Moisturizing Lotion, Fragrance-Free  Curel Therapeutic Moisturizing Lotion, Original Formula  Eucerin Daily Replenishing Lotion  Eucerin Dry Skin Therapy Plus Alpha Hydroxy  Crme  Eucerin Dry Skin Therapy Plus Alpha Hydroxy Lotion  Eucerin Original Crme  Eucerin Original Lotion  Eucerin Plus Crme Eucerin Plus Lotion  Eucerin TriLipid Replenishing Lotion  Keri Anti-Bacterial Hand Lotion  Keri Deep Conditioning Original Lotion Dry Skin Formula Softly Scented  Keri Deep Conditioning Original Lotion, Fragrance Free Sensitive Skin Formula  Keri Lotion Fast Absorbing Fragrance Free Sensitive Skin Formula  Keri Lotion Fast Absorbing Softly Scented Dry Skin Formula  Keri Original Lotion  Keri Skin Renewal Lotion Keri Silky Smooth Lotion  Keri Silky Smooth Sensitive Skin Lotion  Nivea Body Creamy Conditioning Oil  Nivea Body Extra Enriched Lotion  Nivea Body Original Lotion  Nivea Body Sheer Moisturizing Lotion Nivea Crme  Nivea Skin Firming Lotion  NutraDerm 30 Skin Lotion  NutraDerm Skin Lotion  NutraDerm Therapeutic Skin Cream  NutraDerm Therapeutic Skin Lotion  ProShield Protective Hand Cream  Provon moisturizing lotion  Please read over the following fact sheets that you were given.

## 2023-12-20 ENCOUNTER — Encounter (HOSPITAL_COMMUNITY)
Admission: RE | Admit: 2023-12-20 | Discharge: 2023-12-20 | Disposition: A | Discharge status: Home / Self Care | Source: Ambulatory Visit | Attending: Orthopedic Surgery | Admitting: Orthopedic Surgery

## 2023-12-20 ENCOUNTER — Encounter (HOSPITAL_COMMUNITY): Payer: Self-pay

## 2023-12-20 ENCOUNTER — Other Ambulatory Visit: Payer: Self-pay

## 2023-12-20 VITALS — BP 134/89 | HR 83 | Temp 98.3°F | Resp 18 | Ht 68.0 in | Wt 218.8 lb

## 2023-12-20 DIAGNOSIS — Z01818 Encounter for other preprocedural examination: Secondary | ICD-10-CM | POA: Diagnosis present

## 2023-12-20 LAB — TYPE AND SCREEN
ABO/RH(D): A POS
Antibody Screen: NEGATIVE

## 2023-12-20 LAB — CBC
HCT: 54.5 % — ABNORMAL HIGH (ref 39.0–52.0)
Hemoglobin: 18.5 g/dL — ABNORMAL HIGH (ref 13.0–17.0)
MCH: 33.6 pg (ref 26.0–34.0)
MCHC: 33.9 g/dL (ref 30.0–36.0)
MCV: 99.1 fL (ref 80.0–100.0)
Platelets: 322 10*3/uL (ref 150–400)
RBC: 5.5 MIL/uL (ref 4.22–5.81)
RDW: 13.2 % (ref 11.5–15.5)
WBC: 8.5 10*3/uL (ref 4.0–10.5)
nRBC: 0 % (ref 0.0–0.2)

## 2023-12-20 LAB — SURGICAL PCR SCREEN
MRSA, PCR: NEGATIVE
Staphylococcus aureus: NEGATIVE

## 2023-12-20 NOTE — Progress Notes (Signed)
 PCP - Dr. Randie Bustle Cardiologist - denies  PPM/ICD - denies Device Orders - na Rep Notified - na  Chest x-ray - 11/14/2022 EKG - 12/08/2023 Stress Test -  ECHO -  Cardiac Cath -   Sleep Study - denies CPAP - na  Non-diabetic  Blood Thinner Instructions:denies Aspirin  Instructions:denies  ERAS Protcol -Clears until 0430  Anesthesia review: No  Patient denies shortness of breath, fever, cough and chest pain at PAT appointment   All instructions explained to the patient, with a verbal understanding of the material. Patient agrees to go over the instructions while at home for a better understanding. Patient also instructed to self quarantine after being tested for COVID-19. The opportunity to ask questions was provided.

## 2023-12-29 NOTE — Progress Notes (Signed)
 Pt aware of new arrival time 0700, ERAS 0600.

## 2023-12-29 NOTE — Progress Notes (Signed)
 Pt aware time changed back to original arrival at 0530.

## 2024-01-01 ENCOUNTER — Inpatient Hospital Stay (HOSPITAL_COMMUNITY)

## 2024-01-01 ENCOUNTER — Other Ambulatory Visit: Payer: Self-pay

## 2024-01-01 ENCOUNTER — Inpatient Hospital Stay (HOSPITAL_COMMUNITY): Admitting: Anesthesiology

## 2024-01-01 ENCOUNTER — Encounter (HOSPITAL_COMMUNITY)
Admission: RE | Disposition: A | Discharge status: Home / Self Care | Payer: Self-pay | Source: Ambulatory Visit | Attending: Orthopedic Surgery

## 2024-01-01 ENCOUNTER — Inpatient Hospital Stay (HOSPITAL_COMMUNITY)
Admission: RE | Admit: 2024-01-01 | Discharge: 2024-01-02 | DRG: 402 | Disposition: A | Discharge status: Home / Self Care | Source: Ambulatory Visit | Attending: Orthopedic Surgery | Admitting: Orthopedic Surgery

## 2024-01-01 ENCOUNTER — Encounter (HOSPITAL_COMMUNITY): Payer: Self-pay | Admitting: Orthopedic Surgery

## 2024-01-01 DIAGNOSIS — E785 Hyperlipidemia, unspecified: Secondary | ICD-10-CM | POA: Diagnosis present

## 2024-01-01 DIAGNOSIS — M4316 Spondylolisthesis, lumbar region: Secondary | ICD-10-CM | POA: Diagnosis present

## 2024-01-01 DIAGNOSIS — Z87891 Personal history of nicotine dependence: Secondary | ICD-10-CM | POA: Diagnosis not present

## 2024-01-01 DIAGNOSIS — Z981 Arthrodesis status: Principal | ICD-10-CM

## 2024-01-01 DIAGNOSIS — Z888 Allergy status to other drugs, medicaments and biological substances status: Secondary | ICD-10-CM | POA: Diagnosis not present

## 2024-01-01 DIAGNOSIS — Z880 Allergy status to penicillin: Secondary | ICD-10-CM

## 2024-01-01 DIAGNOSIS — M5416 Radiculopathy, lumbar region: Secondary | ICD-10-CM | POA: Diagnosis present

## 2024-01-01 DIAGNOSIS — M1990 Unspecified osteoarthritis, unspecified site: Secondary | ICD-10-CM | POA: Diagnosis present

## 2024-01-01 HISTORY — PX: TRANSFORAMINAL LUMBAR INTERBODY FUSION (TLIF) WITH PEDICLE SCREW FIXATION 1 LEVEL: SHX6141

## 2024-01-01 SURGERY — TRANSFORAMINAL LUMBAR INTERBODY FUSION (TLIF) WITH PEDICLE SCREW FIXATION 1 LEVEL
Anesthesia: General

## 2024-01-01 MED ORDER — PHENYLEPHRINE 80 MCG/ML (10ML) SYRINGE FOR IV PUSH (FOR BLOOD PRESSURE SUPPORT)
PREFILLED_SYRINGE | INTRAVENOUS | Status: DC | PRN
  Administered 2024-01-01 (×2): 160 ug via INTRAVENOUS

## 2024-01-01 MED ORDER — HYDROMORPHONE HCL 1 MG/ML IJ SOLN: 1.0000 mg | INTRAMUSCULAR | Status: DC | PRN

## 2024-01-01 MED ORDER — ORAL CARE MOUTH RINSE: 15.0000 mL | Freq: Once | OROMUCOSAL | Status: AC

## 2024-01-01 MED ORDER — 0.9 % SODIUM CHLORIDE (POUR BTL) OPTIME
TOPICAL | Status: DC | PRN
Start: 2024-01-01 — End: 2024-01-01
  Administered 2024-01-01 (×2): 1000 mL

## 2024-01-01 MED ORDER — TRANEXAMIC ACID-NACL 1000-0.7 MG/100ML-% IV SOLN
1000.0000 mg | INTRAVENOUS | Status: AC
  Administered 2024-01-01: 1000 mg via INTRAVENOUS
  Filled 2024-01-01: qty 100

## 2024-01-01 MED ORDER — OXYCODONE HCL 5 MG PO TABS
10.0000 mg | ORAL_TABLET | ORAL | Status: DC | PRN
  Administered 2024-01-01 – 2024-01-02 (×6): 10 mg via ORAL
  Filled 2024-01-01 (×6): qty 2

## 2024-01-01 MED ORDER — HYDROMORPHONE HCL 1 MG/ML IJ SOLN
0.2500 mg | INTRAMUSCULAR | Status: DC | PRN
  Administered 2024-01-01: 0.5 mg via INTRAVENOUS
  Administered 2024-01-01 (×2): 0.25 mg via INTRAVENOUS

## 2024-01-01 MED ORDER — MAGNESIUM CITRATE PO SOLN
1.0000 | Freq: Once | ORAL | Status: AC | PRN
  Administered 2024-01-02: 1 via ORAL
  Filled 2024-01-01: qty 296

## 2024-01-01 MED ORDER — PHENOL 1.4 % MT LIQD: 1.0000 | OROMUCOSAL | Status: DC | PRN

## 2024-01-01 MED ORDER — AMISULPRIDE (ANTIEMETIC) 5 MG/2ML IV SOLN
10.0000 mg | Freq: Once | INTRAVENOUS | Status: DC | PRN
Start: 2024-01-01 — End: 2024-01-01

## 2024-01-01 MED ORDER — MENTHOL 3 MG MT LOZG: 1.0000 | LOZENGE | OROMUCOSAL | Status: DC | PRN

## 2024-01-01 MED ORDER — BUPIVACAINE-EPINEPHRINE (PF) 0.25% -1:200000 IJ SOLN
INTRAMUSCULAR | Status: AC
  Filled 2024-01-01: qty 30

## 2024-01-01 MED ORDER — CHLORHEXIDINE GLUCONATE 0.12 % MT SOLN
15.0000 mL | Freq: Once | OROMUCOSAL | Status: AC
  Administered 2024-01-01: 15 mL via OROMUCOSAL
  Filled 2024-01-01: qty 15

## 2024-01-01 MED ORDER — ONDANSETRON HCL 4 MG/2ML IJ SOLN: 4.0000 mg | Freq: Four times a day (QID) | INTRAMUSCULAR | Status: DC | PRN

## 2024-01-01 MED ORDER — ONDANSETRON HCL 4 MG/2ML IJ SOLN
INTRAMUSCULAR | Status: DC | PRN
  Administered 2024-01-01: 4 mg via INTRAVENOUS

## 2024-01-01 MED ORDER — DEXAMETHASONE SODIUM PHOSPHATE 10 MG/ML IJ SOLN
INTRAMUSCULAR | Status: AC
  Filled 2024-01-01: qty 1

## 2024-01-01 MED ORDER — DEXAMETHASONE SODIUM PHOSPHATE 10 MG/ML IJ SOLN
INTRAMUSCULAR | Status: DC | PRN
  Administered 2024-01-01: 10 mg via INTRAVENOUS

## 2024-01-01 MED ORDER — SODIUM CHLORIDE 0.9% FLUSH: 3.0000 mL | Freq: Two times a day (BID) | INTRAVENOUS | Status: DC

## 2024-01-01 MED ORDER — PHENYLEPHRINE HCL-NACL 20-0.9 MG/250ML-% IV SOLN
INTRAVENOUS | Status: DC | PRN
  Administered 2024-01-01: 40 ug/min via INTRAVENOUS

## 2024-01-01 MED ORDER — ACETAMINOPHEN 650 MG RE SUPP: 650.0000 mg | RECTAL | Status: DC | PRN

## 2024-01-01 MED ORDER — OXYCODONE-ACETAMINOPHEN 10-325 MG PO TABS: 1.0000 | ORAL_TABLET | Freq: Four times a day (QID) | ORAL | 0 refills | Status: AC | PRN

## 2024-01-01 MED ORDER — OXYCODONE HCL 5 MG/5ML PO SOLN: 5.0000 mg | Freq: Once | ORAL | Status: AC | PRN

## 2024-01-01 MED ORDER — OXYCODONE HCL 5 MG PO TABS
5.0000 mg | ORAL_TABLET | Freq: Once | ORAL | Status: AC | PRN
  Administered 2024-01-01: 5 mg via ORAL

## 2024-01-01 MED ORDER — PROPOFOL 10 MG/ML IV BOLUS
INTRAVENOUS | Status: DC | PRN
  Administered 2024-01-01: 170 mg via INTRAVENOUS
  Administered 2024-01-01: 100 ug/kg/min via INTRAVENOUS

## 2024-01-01 MED ORDER — POLYETHYLENE GLYCOL 3350 17 G PO PACK: 17.0000 g | PACK | Freq: Every day | ORAL | Status: DC | PRN

## 2024-01-01 MED ORDER — LACTATED RINGERS IV SOLN: INTRAVENOUS | Status: DC

## 2024-01-01 MED ORDER — THROMBIN 20000 UNITS EX SOLR
CUTANEOUS | Status: AC
  Filled 2024-01-01: qty 20000

## 2024-01-01 MED ORDER — ACETAMINOPHEN 325 MG PO TABS: 650.0000 mg | ORAL_TABLET | ORAL | Status: DC | PRN

## 2024-01-01 MED ORDER — ONDANSETRON HCL 4 MG PO TABS: 4.0000 mg | ORAL_TABLET | Freq: Four times a day (QID) | ORAL | Status: DC | PRN

## 2024-01-01 MED ORDER — SURGIFLO WITH THROMBIN (HEMOSTATIC MATRIX KIT) OPTIME
TOPICAL | Status: DC | PRN
  Administered 2024-01-01: 1 via TOPICAL

## 2024-01-01 MED ORDER — MIDAZOLAM HCL 2 MG/2ML IJ SOLN
INTRAMUSCULAR | Status: DC | PRN
  Administered 2024-01-01: 2 mg via INTRAVENOUS

## 2024-01-01 MED ORDER — THROMBIN 20000 UNITS EX SOLR: CUTANEOUS | Status: DC | PRN

## 2024-01-01 MED ORDER — HYDROMORPHONE HCL 1 MG/ML IJ SOLN
INTRAMUSCULAR | Status: AC
  Filled 2024-01-01: qty 0.5

## 2024-01-01 MED ORDER — SODIUM CHLORIDE 0.9% FLUSH: 3.0000 mL | INTRAVENOUS | Status: DC | PRN

## 2024-01-01 MED ORDER — SENNOSIDES-DOCUSATE SODIUM 8.6-50 MG PO TABS
1.0000 | ORAL_TABLET | Freq: Two times a day (BID) | ORAL | Status: DC
  Administered 2024-01-01 – 2024-01-02 (×2): 1 via ORAL
  Filled 2024-01-01 (×2): qty 1

## 2024-01-01 MED ORDER — SODIUM CHLORIDE 0.9 % IV SOLN: 250.0000 mL | INTRAVENOUS | Status: DC

## 2024-01-01 MED ORDER — OXYCODONE HCL 5 MG PO TABS
ORAL_TABLET | ORAL | Status: AC
  Filled 2024-01-01: qty 1

## 2024-01-01 MED ORDER — CEFAZOLIN SODIUM-DEXTROSE 2-4 GM/100ML-% IV SOLN
2.0000 g | INTRAVENOUS | Status: AC
  Administered 2024-01-01 (×2): 2 g via INTRAVENOUS
  Filled 2024-01-01: qty 100

## 2024-01-01 MED ORDER — CEFAZOLIN SODIUM-DEXTROSE 1-4 GM/50ML-% IV SOLN
1.0000 g | Freq: Three times a day (TID) | INTRAVENOUS | Status: AC
  Administered 2024-01-01 – 2024-01-02 (×2): 1 g via INTRAVENOUS
  Filled 2024-01-01 (×2): qty 50

## 2024-01-01 MED ORDER — LIDOCAINE 2% (20 MG/ML) 5 ML SYRINGE
INTRAMUSCULAR | Status: AC
  Filled 2024-01-01: qty 5

## 2024-01-01 MED ORDER — OXYCODONE HCL 5 MG PO TABS: 5.0000 mg | ORAL_TABLET | ORAL | Status: DC | PRN

## 2024-01-01 MED ORDER — THROMBIN 20000 UNITS EX KIT
PACK | CUTANEOUS | Status: AC
  Filled 2024-01-01: qty 1

## 2024-01-01 MED ORDER — MIDAZOLAM HCL 2 MG/2ML IJ SOLN
INTRAMUSCULAR | Status: AC
  Filled 2024-01-01: qty 2

## 2024-01-01 MED ORDER — SUCCINYLCHOLINE CHLORIDE 200 MG/10ML IV SOSY
PREFILLED_SYRINGE | INTRAVENOUS | Status: DC | PRN
  Administered 2024-01-01: 140 mg via INTRAVENOUS

## 2024-01-01 MED ORDER — ROCURONIUM BROMIDE 10 MG/ML (PF) SYRINGE
PREFILLED_SYRINGE | INTRAVENOUS | Status: AC
  Filled 2024-01-01: qty 10

## 2024-01-01 MED ORDER — ONDANSETRON HCL 4 MG PO TABS: 4.0000 mg | ORAL_TABLET | Freq: Three times a day (TID) | ORAL | 0 refills | Status: DC | PRN

## 2024-01-01 MED ORDER — FENTANYL CITRATE (PF) 250 MCG/5ML IJ SOLN
INTRAMUSCULAR | Status: AC
  Filled 2024-01-01: qty 5

## 2024-01-01 MED ORDER — ALBUMIN HUMAN 5 % IV SOLN: INTRAVENOUS | Status: DC | PRN

## 2024-01-01 MED ORDER — HYDROMORPHONE HCL 1 MG/ML IJ SOLN
INTRAMUSCULAR | Status: DC | PRN
  Administered 2024-01-01 (×2): .5 mg via INTRAVENOUS

## 2024-01-01 MED ORDER — LIDOCAINE 2% (20 MG/ML) 5 ML SYRINGE
INTRAMUSCULAR | Status: DC | PRN
  Administered 2024-01-01: 100 mg via INTRAVENOUS

## 2024-01-01 MED ORDER — PROPOFOL 10 MG/ML IV BOLUS
INTRAVENOUS | Status: AC
  Filled 2024-01-01: qty 20

## 2024-01-01 MED ORDER — MEPERIDINE HCL 25 MG/ML IJ SOLN: 6.2500 mg | INTRAMUSCULAR | Status: DC | PRN

## 2024-01-01 MED ORDER — ONDANSETRON HCL 4 MG/2ML IJ SOLN
INTRAMUSCULAR | Status: AC
Start: 2024-01-01 — End: 2024-01-01
  Filled 2024-01-01: qty 2

## 2024-01-01 MED ORDER — METHOCARBAMOL 500 MG PO TABS: 500.0000 mg | ORAL_TABLET | Freq: Three times a day (TID) | ORAL | 0 refills | Status: AC | PRN

## 2024-01-01 MED ORDER — SODIUM CHLORIDE 0.9 % IV SOLN: 12.5000 mg | INTRAVENOUS | Status: DC | PRN

## 2024-01-01 MED ORDER — METHOCARBAMOL 1000 MG/10ML IJ SOLN: 500.0000 mg | Freq: Four times a day (QID) | INTRAMUSCULAR | Status: DC | PRN

## 2024-01-01 MED ORDER — BUPIVACAINE-EPINEPHRINE 0.25% -1:200000 IJ SOLN
INTRAMUSCULAR | Status: DC | PRN
  Administered 2024-01-01: 10 mL

## 2024-01-01 MED ORDER — HYDROMORPHONE HCL 1 MG/ML IJ SOLN
INTRAMUSCULAR | Status: AC
Start: 2024-01-01 — End: 2024-01-01
  Filled 2024-01-01: qty 1

## 2024-01-01 MED ORDER — METHOCARBAMOL 500 MG PO TABS
500.0000 mg | ORAL_TABLET | Freq: Four times a day (QID) | ORAL | Status: DC | PRN
  Administered 2024-01-01 – 2024-01-02 (×3): 500 mg via ORAL
  Filled 2024-01-01 (×3): qty 1

## 2024-01-01 MED ORDER — GELATIN ABSORBABLE 100 EX MISC
CUTANEOUS | Status: DC | PRN
  Administered 2024-01-01: 1

## 2024-01-01 MED ORDER — FENTANYL CITRATE (PF) 250 MCG/5ML IJ SOLN
INTRAMUSCULAR | Status: DC | PRN
  Administered 2024-01-01 (×2): 100 ug via INTRAVENOUS
  Administered 2024-01-01: 50 ug via INTRAVENOUS

## 2024-01-01 MED ORDER — PHENYLEPHRINE 80 MCG/ML (10ML) SYRINGE FOR IV PUSH (FOR BLOOD PRESSURE SUPPORT)
PREFILLED_SYRINGE | INTRAVENOUS | Status: AC
  Filled 2024-01-01: qty 10

## 2024-01-01 SURGICAL SUPPLY — 74 items
BAG COUNTER SPONGE SURGICOUNT (BAG) ×1 IMPLANT
BLADE BONE MILL MEDIUM (MISCELLANEOUS) IMPLANT
BLADE CLIPPER SURG (BLADE) IMPLANT
BUR BARREL STRAIGHT FLUTE 4.0 (BURR) IMPLANT
BUR EGG ELITE 4.0 (BURR) ×1 IMPLANT
BUR MATCHSTICK NEURO 3.0 LAGG (BURR) ×1 IMPLANT
CAGE MOD EX PL 7X9X24 17D (Cage) IMPLANT
CANISTER SUCTION 3000ML PPV (SUCTIONS) ×1 IMPLANT
CAP RELINE MOD TULIP RMM (Cap) IMPLANT
CLSR STERI-STRIP ANTIMIC 1/2X4 (GAUZE/BANDAGES/DRESSINGS) ×1 IMPLANT
CNTNR URN SCR LID CUP LEK RST (MISCELLANEOUS) IMPLANT
COVER SURGICAL LIGHT HANDLE (MISCELLANEOUS) ×1 IMPLANT
DRAPE C-ARM 42X72 X-RAY (DRAPES) ×1 IMPLANT
DRAPE C-ARMOR (DRAPES) ×1 IMPLANT
DRAPE POUCH INSTRU U-SHP 10X18 (DRAPES) IMPLANT
DRAPE SURG 17X23 STRL (DRAPES) ×1 IMPLANT
DRAPE U-SHAPE 47X51 STRL (DRAPES) ×1 IMPLANT
DRSG OPSITE POSTOP 4X6 (GAUZE/BANDAGES/DRESSINGS) ×1 IMPLANT
DRSG OPSITE POSTOP 4X8 (GAUZE/BANDAGES/DRESSINGS) ×1 IMPLANT
DURAPREP 26ML APPLICATOR (WOUND CARE) ×1 IMPLANT
ELECT BLADE 6.5 EXT (BLADE) ×1 IMPLANT
ELECT NVM5 SURFACE MEP/EMG (ELECTRODE) IMPLANT
ELECT PENCIL ROCKER SW 15FT (MISCELLANEOUS) ×1 IMPLANT
ELECT REM PT RETURN 15FT ADLT (MISCELLANEOUS) IMPLANT
ELECTRODE BLDE 4.0 EZ CLN MEGD (MISCELLANEOUS) IMPLANT
ELECTRODE REM PT RTRN 9FT ADLT (ELECTROSURGICAL) ×1 IMPLANT
GLOVE BIO SURGEON STRL SZ7 (GLOVE) IMPLANT
GLOVE BIOGEL PI IND STRL 8.5 (GLOVE) ×1 IMPLANT
GLOVE INDICATOR 7.0 STRL GRN (GLOVE) IMPLANT
GLOVE SS BIOGEL STRL SZ 8.5 (GLOVE) ×1 IMPLANT
GOWN STRL REUS W/TWL 2XL LVL3 (GOWN DISPOSABLE) ×1 IMPLANT
GOWN STRL SURGICAL XL XLNG (GOWN DISPOSABLE) IMPLANT
KIT BASIN OR (CUSTOM PROCEDURE TRAY) ×1 IMPLANT
KIT POSITIONER JACKSON TABLE (MISCELLANEOUS) IMPLANT
KIT TURNOVER KIT B (KITS) ×1 IMPLANT
MODULE EMG NDL SSEP NVM5 (NEUROSURGERY SUPPLIES) IMPLANT
MODULE EMG NEEDLE SSEP NVM5 (NEUROSURGERY SUPPLIES) ×1 IMPLANT
NDL 22X1.5 STRL (OR ONLY) (MISCELLANEOUS) IMPLANT
NDL HYPO 22X1.5 SAFETY MO (MISCELLANEOUS) IMPLANT
NDL SPNL 18GX3.5 QUINCKE PK (NEEDLE) ×1 IMPLANT
NEEDLE 22X1.5 STRL (OR ONLY) (MISCELLANEOUS) IMPLANT
NEEDLE HYPO 22X1.5 SAFETY MO (MISCELLANEOUS) ×1 IMPLANT
NEEDLE SPNL 18GX3.5 QUINCKE PK (NEEDLE) ×2 IMPLANT
NS IRRIG 1000ML POUR BTL (IV SOLUTION) ×1 IMPLANT
PACK LAMINECTOMY ORTHO (CUSTOM PROCEDURE TRAY) ×1 IMPLANT
PACK UNIVERSAL I (CUSTOM PROCEDURE TRAY) ×1 IMPLANT
PAD ARMBOARD POSITIONER FOAM (MISCELLANEOUS) ×2 IMPLANT
PATTIES SURGICAL .5 X.5 (GAUZE/BANDAGES/DRESSINGS) IMPLANT
PATTIES SURGICAL .5 X1 (DISPOSABLE) ×1 IMPLANT
POSITIONER HEAD PRONE TRACH (MISCELLANEOUS) ×1 IMPLANT
PROBE BALL TIP NVM5 SNG USE (NEUROSURGERY SUPPLIES) IMPLANT
PUTTY DBM INSTAFILL CART 5CC (Putty) IMPLANT
ROD RELINE 5.0X45MM (Rod) IMPLANT
SCREW LOCK RSS 4.5/5.0MM (Screw) IMPLANT
SCREW SHANK RELINE 6.5X40MM (Screw) IMPLANT
SHANK RELINE O MOD 6.5X45 (Screw) IMPLANT
SPONGE SURGIFOAM ABS GEL 100 (HEMOSTASIS) ×1 IMPLANT
SPONGE T-LAP 4X18 ~~LOC~~+RFID (SPONGE) ×3 IMPLANT
SURGIFLO W/THROMBIN 8M KIT (HEMOSTASIS) ×1 IMPLANT
SUT BONE WAX W31G (SUTURE) ×1 IMPLANT
SUT MNCRL AB 3-0 PS2 27 (SUTURE) ×2 IMPLANT
SUT MNCRL+ AB 3-0 CT1 36 (SUTURE) ×1 IMPLANT
SUT STRATAFIX 1PDS 45CM VIOLET (SUTURE) IMPLANT
SUT VIC AB 0 CT1 27XBRD ANTBC (SUTURE) ×1 IMPLANT
SUT VIC AB 1 CT1 18XCR BRD 8 (SUTURE) ×1 IMPLANT
SUT VIC AB 2-0 CT1 18 (SUTURE) ×1 IMPLANT
SYR BULB IRRIG 60ML STRL (SYRINGE) ×1 IMPLANT
SYR CONTROL 10ML LL (SYRINGE) ×1 IMPLANT
TIP CONICAL INSTAFILL (ORTHOPEDIC DISPOSABLE SUPPLIES) IMPLANT
TOWEL GREEN STERILE (TOWEL DISPOSABLE) ×1 IMPLANT
TOWEL GREEN STERILE FF (TOWEL DISPOSABLE) ×1 IMPLANT
TRAY FOLEY MTR SLVR 16FR STAT (SET/KITS/TRAYS/PACK) ×1 IMPLANT
WATER STERILE IRR 1000ML POUR (IV SOLUTION) ×1 IMPLANT
YANKAUER SUCT BULB TIP NO VENT (SUCTIONS) ×1 IMPLANT

## 2024-01-01 NOTE — Discharge Instructions (Signed)

## 2024-01-01 NOTE — Transfer of Care (Signed)
 Immediate Anesthesia Transfer of Care Note  Patient: Anthony Reese  Procedure(s) Performed: TRANSFORAMINAL LUMBAR INTERBODY FUSION (TLIF) WITH PEDICLE SCREW FIXATION 1 LEVEL  Patient Location: PACU  Anesthesia Type:General  Level of Consciousness: awake  Airway & Oxygen Therapy: Patient Spontanous Breathing and Patient connected to face mask oxygen  Post-op Assessment: Report given to RN, Post -op Vital signs reviewed and stable, and Patient moving all extremities  Post vital signs: Reviewed and stable  Last Vitals:  Vitals Value Taken Time  BP 129/88 01/01/24 12:30  Temp    Pulse 102 01/01/24 12:33  Resp 20 01/01/24 12:33  SpO2 99 % 01/01/24 12:33  Vitals shown include unfiled device data.  Last Pain:  Vitals:   01/01/24 0558  TempSrc:   PainSc: 3       Patients Stated Pain Goal: 3 (01/01/24 0558)  Complications: No notable events documented.

## 2024-01-01 NOTE — H&P (Signed)
 History:  Anthony Reese complains of significant right L5 radicular pain. He has trace EHL/tibialis anterior weakness consistent with L5 radiculopathy along with loss to the normal sensation in the L5 dermatome. He also has significant back pain in the lumbar region that is intensified with forward flexion and relieved with extension.  Imaging confirmed an isthmic spondylolisthesis.  As a result of his pain, weakness, and structural deformity we elected to move forward with surgery.  Past Medical History:  Diagnosis Date   Allergy    Anxiety    Arthritis    Chicken pox    Dysrhythmia    Tachycardia   H/O tobacco use, presenting hazards to health 07/31/2011   Headache, cluster, episodic    none in several years   Injury of left shoulder 01/30/2013   Low testosterone     Nasal polyp 11/18/2013   On home oxygen therapy    ONLY uses for cluster headaches   Other and unspecified hyperlipidemia 11/18/2013   Overweight(278.02)    Oxygen deficiency    Preventative health care 11/18/2013   Radiculopathy affecting upper extremity 11/20/2011   Tobacco use disorder 07/31/2011    Allergies  Allergen Reactions   Imitrex [Sumatriptan] Anaphylaxis    Swelling around eyes and shortness of breath   Verapamil Anaphylaxis    Swelling around eyes and shortness of breath   Penicillins Other (See Comments)    Unknown reaction as a child. Tolerated Cephalosporin Date: 12/07/22.      No current facility-administered medications on file prior to encounter.   Current Outpatient Medications on File Prior to Encounter  Medication Sig Dispense Refill   cetirizine  (ZYRTEC ) 10 MG tablet Take 10 mg by mouth daily as needed for allergies.     HYDROcodone -acetaminophen  (NORCO/VICODIN) 5-325 MG tablet Take 1 tablet by mouth 2 (two) times daily as needed for pain. 60 tablet 0   testosterone  cypionate (DEPOTESTOSTERONE CYPIONATE) 200 MG/ML injection Inject 1 mL (200 mg total) into the muscle every 14  (fourteen) days. (Patient taking differently: Inject 200 mg into the muscle once a week.) 4 mL 0    Physical Exam: Vitals:   01/01/24 0545  BP: (!) 118/92  Pulse: 92  Resp: 20  Temp: 98.1 F (36.7 C)  SpO2: 95%   Body mass index is 32.69 kg/m. Anthony Reese is a pleasant individual, who appears younger than their stated age.  He is alert and orientated 3.  No shortness of breath, chest pain.  Abdomen is soft and non-tender, negative loss of bowel and bladder control, no rebound tenderness.  Negative: skin lesions abrasions contusions  Peripheral pulses: 2+ peripheral pulses bilaterally. LE compartments are: Soft and nontender.  Gait pattern: Altered gait pattern due to back buttock and neuropathic right leg pain  Assistive devices: None  Neuro: Positive decreased sensation light touch along with dysesthesias and neuropathic pain in the right L5 dermatome. Negative straight leg raise test, no clonus, negative Babinski test. 5/5 motor strength in the lower extremity except for trace weakness of the right EHL/tibialis anterior. Symmetrical 1+ deep tendon reflexes.  Musculoskeletal: Moderate to severe low back pain in the right paraspinal region radiating into the right lower extremity. No SI joint pain, no hip, knee, ankle pain with isolated joint range of motion. Pain is intensified with forward flexion and rotation.  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 Loss of normal disc height is noted. No scoliosis. There  is improvement in the spondylolisthesis from grade 2 to grade 1 with extension of the spine. Further progression of the slip with forward flexion.  Lumbar MRI: completed on 11/08/2023. 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 Severe bilateral neuroforaminal narrowing. Positive Modic endplate changes are noted. No significant  pathology at the remainder of the level   A/P: Anthony Reese complains of significant right L5 radicular pain. He has trace EHL/tibialis anterior weakness consistent with L5 radiculopathy along with loss to the normal sensation in the L5 dermatome. He also has significant back pain in the lumbar region that is intensified with forward flexion and relieved with extension. Imaging studies: X-rays demonstrate an isthmic spondylolisthesis with dynamic instability on flexion-extension views. The there is significant foraminal stenosis secondary to the pars defects. MRI imaging confirms the foraminal stenosis as well as the degenerative disc disease with Modic endplate changes and the isthmic spondylolisthesis. Treatment: The patient has had injection therapy as well as formalized physical therapy and unfortunately his quality of life is continued to deteriorate. He has both radicular pain and mechanical/axial back pain.  I have reviewed the imaging studies with the patient in great detail and we have discussed the pathology at length. As result of the isthmic spondylolisthesis he has severe foraminal stenosis which is affecting the traversing L5 nerve root as well as the exiting L4 nerve root. This does correlate with his area of weakness and dysesthesias. Imaging clearly demonstrates a significant isthmic spondylolisthesis with dynamic instability on flexion-extension views. At this point in time having failed conservative management I do believe surgical intervention is reasonable. I have recommended a TLIF at L4-5. This would allow for direct neural decompression by excising the pars defect and fibrocartilage as well as decompressing the lateral recess. Once the L4 and L5 nerve roots are decompressed stability can be obtained with the interbody cage and pedicle screw construct.  Risks and benefits of lumbar decompression/discectomy: Infection, bleeding, death, stroke, paralysis, ongoing or worse pain, need for  additional surgery, leak of spinal fluid, adjacent segment degeneration requiring additional surgery, post-operative hematoma formation that can result in neurological compromise and the need for urgent/emergent re-operation. Loss in bowel and bladder control. Injury to major vessels that could result in the need for urgent abdominal surgery to stop bleeding. Risk of deep venous thrombosis (DVT) and the need for additional treatment. Recurrent disc herniation resulting in the need for revision surgery, which could include fusion surgery (utilizing instrumentation such as pedicle screws and intervertebral cages).  Risks and benefits of posterior spinal fusion: Infection, bleeding, death, stroke, paralysis, ongoing or worse pain, need for additional surgery, nonunion, leak of spinal fluid, adjacent segment degeneration requiring additional fusion surgery, Injury to abdominal vessels that can require anterior surgery to stop bleeding. Malposition of the cage and/or pedicle screws that could require additional surgery. Loss of bowel and bladder control. Postoperative hematoma causing neurologic compression that could require urgent or emergent re-operation.  The patient has expressed an understanding of the risks and benefits of surgery and a willingness to move forward.

## 2024-01-01 NOTE — Brief Op Note (Signed)
 01/01/2024  12:08 PM  PATIENT:  Anthony Reese  53 y.o. male  PRE-OPERATIVE DIAGNOSIS:  Grade 2 Spondylothesis with radiculopathy L4-L5  POST-OPERATIVE DIAGNOSIS:  Grade 2 Spondylothesis with radiculopathy L4-L5  PROCEDURE:  Procedure(s) with comments: TRANSFORAMINAL LUMBAR INTERBODY FUSION (TLIF) WITH PEDICLE SCREW FIXATION 1 LEVEL (N/A) - Transforaminal lumbar interbody fusion L4-L5  SURGEON:  Surgeons and Role:    DEWAINE Burnetta Aures, MD - Primary  PHYSICIAN ASSISTANT: Jeoffrey Sages, PA  ASSISTANTS:    ANESTHESIA:   general  EBL:  100 mL   BLOOD ADMINISTERED:none  DRAINS: none   LOCAL MEDICATIONS USED:  MARCAINE      SPECIMEN:  No Specimen  DISPOSITION OF SPECIMEN:  N/A  COUNTS:  YES  TOURNIQUET:  * No tourniquets in log *  DICTATION: .Dragon Dictation  PLAN OF CARE: Admit to inpatient   PATIENT DISPOSITION:  PACU - hemodynamically stable.

## 2024-01-01 NOTE — Anesthesia Procedure Notes (Addendum)
 Procedure Name: Intubation Date/Time: 01/01/2024 7:45 AM  Performed by: Julien Manus, CRNAPre-anesthesia Checklist: Patient identified, Emergency Drugs available, Suction available and Patient being monitored Patient Re-evaluated:Patient Re-evaluated prior to induction Oxygen Delivery Method: Circle System Utilized Preoxygenation: Pre-oxygenation with 100% oxygen Induction Type: IV induction Ventilation: Mask ventilation without difficulty Tube type: Oral Tube size: 7.5 mm Number of attempts: 1 Airway Equipment and Method: Stylet and Oral airway Placement Confirmation: ETT inserted through vocal cords under direct vision, positive ETCO2 and breath sounds checked- equal and bilateral Secured at: 23 cm Tube secured with: Tape Dental Injury: Teeth and Oropharynx as per pre-operative assessment  Comments: Soft bite block placed prior to pt going prone

## 2024-01-01 NOTE — Op Note (Addendum)
 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  All 4 pedicle screws were directly stimulated.  No adverse activity below threshold.  Left side: L4: Positive activity at 29 mA.  L5: Positive activity 34 mA.  Right side: L4 positive activity at 20 mA.  L5: Positive activity at 12 mA.  Implants: NuVasive cortical pedicle screws.  L4: 6.5 x 45 mm length.  L5: 6.5 x 40 mm length.  5.0 x 45 mm length rod.   NuVasive expandable TLIF cage.  7 x 9 x 24.  Expanded to an anterior height of 9.5 mm and posterior height of 8.5 mm.  Graft: Autograft with DBX mix  BRIEF HISTORY: Anthony Reese is a 53 y.o. male who presented to my office with significant back buttock and neuropathic right leg pain.  Imaging demonstrated grade 2 isthmic spondylolisthesis at L4-5 with foraminal lateral recess stenosis affecting the exiting L4 and traversing L5 nerve root.  In addition patient had L5 radicular weakness  PROCEDURE DETAILS: Patient was brought into the operating room and was properly positioned on the operating room table.  After induction with general anesthesia the patient was endotracheally intubated.  A timeout was taken to confirm all important data: including patient, procedure, and the level. Teds, SCD's were applied.   The neuromonitoring representative placed the needles and the nurse placed the Foley.  The patient was then turned prone onto the spine frame and all bony prominences were well-padded.  The back was then prepped and draped in a standard fashion.  Using  fluoroscopy I marked out my incision site which would spanned from the L4 pedicle down to the inferior aspect of the L5 pedicle.  I then infiltrated my incision site with quarter percent Marcaine  with epinephrine .  Midline incision was made and sharp dissection was carried out down to the deep fascia.  The deep fascia was sharply incised and I stripped the paraspinal muscles to expose the L4 and L5 spinous process.  I then dissected along the L4 spinous process along the lamina and out to the facet complex.  I did a similar dissection with Cobb and Bovie over L5.  Once I had the L3-4 and L4-5 facet complexes identified and exposed bilaterally I then took imaging to confirm that I was at the appropriate level.  Once I confirmed this I used Bovie to remove the facet capsule at the L4-5 complex.  At this point the pars defect was now clearly visible.  The fibrocartilaginous material was removed.  On the right side which was the more symptomatic side a large fragment of bone was removed from the pars area.  With the approach complete I then began placing my screws.  Using a high-speed bur I identified the pedicle on the AP view.  I then breach the cortex at the inferior medial border of the pedicle and then placed my pedicle awl.  I advanced the awl towards the superior lateral aspect of the pedicle.  As I neared the lateral third of the pedicle in the AP view I confirmed in the lateral view that  I was just beyond the posterior wall of the vertebral body.  Once I confirmed my trajectory I advanced into the vertebral body.  I then removed the awl and palpated the hole with a ball-tipped feeler.  Once I confirmed that it was intact I then tapped with the 6.5 tap.  I then repalpated the hole confirming it was intact.  I then placed the screw which had excellent overall purchase.  Using the same technique I placed the screw at L5 and on the contralateral side.  Once all 4 pedicle screws were properly seated I then  directly stimulated each of them.  All screws tested in the green (above threshold) confirming there was no evidence of neural compression/irritation.  Imaging also confirmed satisfactory positioning and placement of the screws.  At this point I used a double-action Leksell rongeur to remove the inferior third of the spinous process of L4.  The posterior element was very mobile given the bilateral pars defect.  I then removed the bulk of the L4 lamina using a double-action Leksell rongeur.  I then used an osteotome to remove the inferior articular process on the right side.  I then used Kerrison rongeurs to perform a more comprehensive laminotomy of L4 on the right side.  I then used my Penfield 4 to dissect through the central raphae of the ligamentum flavum and create a plane between the ligamentum flavum and the thecal sac.  Using Kerrison rongeurs I remove the ligamentum flavum and proceeded into the lateral recess.  The medial aspect of the superior articulating process was resected.  I could now visualize the medial border of the L5 pedicle.  I then identified the L5 nerve root and began decompressing the leading edge of the L5 lamina and proceeded to perform a medial foraminotomy of L5.  I then proceeded superiorly removing the remaining portion of the pars until I could palpate the L4 pedicle and visualize the L4 nerve root.  At this point both nerve roots were adequately decompressed.  I then mobilized the thecal sac and placed a retractor.  The posterior annulus now came into view.  Using a osteotome I advanced into the disc space.  I then used pituitary rongeurs and sidecutting curettes to perform a discectomy.  I then placed a 9 mm distraction trial into the disc space and this provided excellent overall fit.  With the disc space somewhat reduced I then placed a temporary rod on the left side to hold my reduction in place.  I then continued performing additional discord with the sidecutting curettes.   Once I had performed an adequate discectomy and I had reduced the grade 2 slip to a grade 1 I then irrigated the wound copiously with normal saline.  With both the nerve roots protected and the thecal sac retracted I placed my TLIF cage.  I obtained the NuVasive expandable TLIF cage and gently began inserting it in an oblique fashion until it was countersunk approximately 2 mm with respect to the posterior aspect of the vertebral body of L4.  At this point I disconnected the temporary locking rod on the left side in order to facilitate the expansion of the intervertebral cage.  I then expanded the anterior posterior height sequentially until I had an excellent overall fit.  I then disengaged the insertion device and checked on the AP view.  The cage was just at the midline portion and provided parallel endplate expansion..  Using a [facility] I could clearly and easily  palpate into the left lateral recess and out the L5 foramen.  I did feel as though the indirect decompression of the left side was satisfactory.  My nerve hook could freely pass along the path of the L5 nerve root into the foramen.  The nerve root itself was under no compression or tension.  I could also palpate superiorly towards the L4 nerve root.  I can also visualize the L4 nerve root and I confirmed there was no significant remaining portion of the pars that was causing foraminal stenosis.  Autograft bone was then placed lateral to the cage in the intervertebral space.  The cage itself was then backfilled with DBX.  With the decompression completed I irrigated the wound copiously with normal saline.  Using a high-speed bur I decorticated the left L4-5 facet complex as well as the L4 transverse process.  Once the area was prepped for the bone graft I placed the remaining portion of autograft bone in the lateral recess.  A 45 mm rod was then obtained placed and the locking caps inserted.  The locking caps were tightened/torqued according  to manufacture standards.  I then attached the polyaxial heads to the right L4 and L5 screws and then used the same size rod.  The locking caps were engaged and tightened according manufacture standards.  I then irrigated the wound copiously normal saline and make sure that hemostasis.  I then confirmed 1 last time provide nerve hook that had adequate decompression on the right side of the L5 and the L4 nerve roots and that I could freely pass my nerve root into the left lateral recess confirming an adequate indirect decompression.  The wound was copiously irrigated with normal saline.  A thrombin-soaked Gelfoam patty was placed over the laminotomy defect and final intraoperative images were taken.  I had satisfactory positioning of the cage and the pedicle screws in both planes.  The deep fascia was then closed with a running #1 strata fix followed by interrupted 2-0 Vicryl sutures followed by 3-0 Monocryl.  She was ultimately extubated and transferred the PACU without incident.  The end of the case all needle and sponge counts were correct.  There were no adverse intraoperative events.  Donaciano Sprang, MD 01/01/2024 11:46 AM

## 2024-01-01 NOTE — Anesthesia Preprocedure Evaluation (Signed)
 Anesthesia Evaluation  Patient identified by MRN, date of birth, ID band Patient awake    Reviewed: Allergy & Precautions, NPO status , Patient's Chart, lab work & pertinent test results  History of Anesthesia Complications Negative for: history of anesthetic complications  Airway Mallampati: II  TM Distance: >3 FB Neck ROM: Full    Dental no notable dental hx. (+) Teeth Intact, Dental Advisory Given   Pulmonary neg shortness of breath, neg sleep apnea, neg COPD, neg recent URI, Patient abstained from smoking., former smoker   Pulmonary exam normal breath sounds clear to auscultation       Cardiovascular Exercise Tolerance: Good (-) hypertension(-) angina (-) Past MI, (-) Cardiac Stents, (-) CABG, (-) Orthopnea and (-) PND negative cardio ROS Normal cardiovascular exam Rhythm:Regular Rate:Normal     Neuro/Psych  Headaches, neg Seizures  Anxiety      Neuromuscular disease (radiculopathy of upper extremity)    GI/Hepatic negative GI ROS, Neg liver ROS,neg GERD  ,,  Endo/Other  negative endocrine ROSneg diabetes  Low testosterone   Renal/GU Lab Results      Component                Value               Date                      CREATININE               0.83                11/14/2022                      K                        4.7                 11/14/2022                   Musculoskeletal  (+) Arthritis , Osteoarthritis,    Abdominal  (+) + obese  Peds  Hematology Lab Results      Component                Value               Date                             HGB                      16.4                11/14/2022                HCT                      47.0                11/14/2022                        PLT                      349.0               11/14/2022  Anesthesia Other Findings All: imitrex verapamil, PCNs  HLD  Reproductive/Obstetrics                              Anesthesia Physical Anesthesia Plan  ASA: 2  Anesthesia Plan: General   Post-op Pain Management:    Induction: Intravenous  PONV Risk Score and Plan: 2 and Treatment may vary due to age or medical condition, Ondansetron  and Midazolam   Airway Management Planned: Oral ETT  Additional Equipment: None  Intra-op Plan:   Post-operative Plan: Extubation in OR  Informed Consent: I have reviewed the patients History and Physical, chart, labs and discussed the procedure including the risks, benefits and alternatives for the proposed anesthesia with the patient or authorized representative who has indicated his/her understanding and acceptance.     Dental advisory given  Plan Discussed with:   Anesthesia Plan Comments: (   )        Anesthesia Quick Evaluation

## 2024-01-02 ENCOUNTER — Encounter (HOSPITAL_COMMUNITY): Payer: Self-pay | Admitting: Orthopedic Surgery

## 2024-01-02 MED FILL — Thrombin For Soln Kit 20000 Unit: CUTANEOUS | Qty: 1 | Status: AC

## 2024-01-02 NOTE — Progress Notes (Signed)
 Patient alert and oriented, void, ambulate. Surgical site clean and dry no sign of infection.d/c instructions explain and given to the patient and family.

## 2024-01-02 NOTE — Evaluation (Signed)
 Physical Therapy Evaluation Patient Details Name: Anthony Reese MRN: [id number] DOB: 11-14-1970 Today's Date: 01/02/2024  History of Present Illness  Pt is a 53 y.o. male s/p TLIFL4-5 01/01/2024. PMH significant for arthritis, dysrhythmia, HLD, radiculopathy affecting the UE, multi bilat knee sx  Clinical Impression  PT eval complete. Pt demo mod I bed mobility and transfers. Supervision amb 200' without AD, and supervision ascend/descend flight of stairs with bilat rails. Pt able to don LSO independently. All education complete. Pt's wife will be on leave from work for 10 days to provide 24-hour assist at home. No DME or follow up needs. PT signing off.         If plan is discharge home, recommend the following: Assistance with cooking/housework;Help with stairs or ramp for entrance;Assist for transportation   Can travel by private vehicle        Equipment Recommendations None recommended by PT  Recommendations for Other Services       Functional Status Assessment Patient has had a recent decline in their functional status and demonstrates the ability to make significant improvements in function in a reasonable and predictable amount of time.     Precautions / Restrictions Precautions Precautions: Back Recall of Precautions/Restrictions: Intact Precaution/Restrictions Comments: Educated on 3/3 back precautions. Handout provided. Required Braces or Orthoses: Spinal Brace Spinal Brace: Lumbar corset;Applied in standing position      Mobility  Bed Mobility Overal bed mobility: Modified Independent             General bed mobility comments: +rail, increased time    Transfers Overall transfer level: Modified independent Equipment used: None               General transfer comment: increased time to power up    Ambulation/Gait Ambulation/Gait assistance: Supervision Gait Distance (Feet): 200 Feet Assistive device: None Gait Pattern/deviations: Step-through  pattern, Decreased stride length Gait velocity: decreased Gait velocity interpretation: 1.31 - 2.62 ft/sec, indicative of limited community ambulator   General Gait Details: steady gait without AD  Stairs Stairs: Yes Stairs assistance: Supervision Stair Management: Two rails, Alternating pattern, Step to pattern, Forwards Number of Stairs: 12 General stair comments: alternating with ascent and step-to with descent  Wheelchair Mobility     Tilt Bed    Modified Rankin (Stroke Patients Only)       Balance Overall balance assessment: No apparent balance deficits (not formally assessed)                                           Pertinent Vitals/Pain Pain Assessment Pain Assessment: Faces Faces Pain Scale: Hurts little more Pain Location: back Pain Descriptors / Indicators: Grimacing, Guarding Pain Intervention(s): Monitored during session    Home Living Family/patient expects to be discharged to:: Private residence Living Arrangements: Spouse/significant other;Children Available Help at Discharge: Family;Available 24 hours/day Type of Home: House Home Access: Stairs to enter Entrance Stairs-Rails: Right;Left;Can reach both Entrance Stairs-Number of Steps: 5   Home Layout: One level Home Equipment: Agricultural consultant (2 wheels);Cane - single point      Prior Function Prior Level of Function : Independent/Modified Independent;Driving;Working/employed                     Extremity/Trunk Assessment   Upper Extremity Assessment Upper Extremity Assessment: Defer to OT evaluation    Lower Extremity Assessment Lower Extremity Assessment: RLE deficits/detail RLE Deficits /  Details: numbness R thigh and foot    Cervical / Trunk Assessment Cervical / Trunk Assessment: Back Surgery  Communication   Communication Communication: No apparent difficulties    Cognition Arousal: Alert Behavior During Therapy: WFL for tasks assessed/performed    PT - Cognitive impairments: No apparent impairments                         Following commands: Intact       Cueing       General Comments      Exercises     Assessment/Plan    PT Assessment Patient does not need any further PT services  PT Problem List         PT Treatment Interventions      PT Goals (Current goals can be found in the Care Plan section)  Acute Rehab PT Goals Patient Stated Goal: home today PT Goal Formulation: All assessment and education complete, DC therapy    Frequency       Co-evaluation               AM-PAC PT 6 Clicks Mobility  Outcome Measure Help needed turning from your back to your side while in a flat bed without using bedrails?: None Help needed moving from lying on your back to sitting on the side of a flat bed without using bedrails?: None Help needed moving to and from a bed to a chair (including a wheelchair)?: None Help needed standing up from a chair using your arms (e.g., wheelchair or bedside chair)?: None Help needed to walk in hospital room?: A Little Help needed climbing 3-5 steps with a railing? : A Little 6 Click Score: 22    End of Session Equipment Utilized During Treatment: Back brace;Gait belt Activity Tolerance: Patient tolerated treatment well Patient left: in bed;with call bell/phone within reach Nurse Communication: Mobility status PT Visit Diagnosis: Other abnormalities of gait and mobility (R26.89)    Time: 9255-9241 PT Time Calculation (min) (ACUTE ONLY): 14 min   Charges:   PT Evaluation $PT Eval Low Complexity: 1 Low   PT General Charges $$ ACUTE PT VISIT: 1 Visit         Sari MATSU., PT  Office # [phone]   Erven Sari Shaker 01/02/2024, 8:06 AM

## 2024-01-02 NOTE — Anesthesia Postprocedure Evaluation (Signed)
 Anesthesia Post Note  Patient: Anthony Reese  Procedure(s) Performed: TRANSFORAMINAL LUMBAR INTERBODY FUSION (TLIF) WITH PEDICLE SCREW FIXATION 1 LEVEL     Patient location during evaluation: PACU Anesthesia Type: General Level of consciousness: awake and alert Pain management: pain level controlled Vital Signs Assessment: post-procedure vital signs reviewed and stable Respiratory status: spontaneous breathing, nonlabored ventilation and respiratory function stable Cardiovascular status: blood pressure returned to baseline and stable Postop Assessment: no apparent nausea or vomiting Anesthetic complications: no   No notable events documented.  Last Vitals:  Vitals:   01/01/24 2253 01/02/24 0323  BP: 130/82 130/78  Pulse: 88 89  Resp: 18 18  Temp: 37.2 C 37.4 C  SpO2: 97% 99%    Last Pain:  Vitals:   01/02/24 0647  TempSrc:   PainSc: Asleep                 Butler Levander Pinal

## 2024-01-02 NOTE — Evaluation (Signed)
 Occupational Therapy Evaluation Patient Details Name: Anthony Reese MRN: [id number] DOB: 04/02/1971 Today's Date: 01/02/2024   History of Present Illness   Pt is a 53 y.o. male s/p TLIFL4-5 01/01/2024. PMH significant for arthritis, dysrhythmia, HLD, radiculopathy affecting the UE, multi bilat knee sx     Clinical Impressions PTA, pt lived with wife, and was independent, working, and driving. Upon eval, pt performing UB ADL with mod I and LB ADL with up to min A with use of AE. Pt educated and demonstrating use of compensatory techniques for bed mobility, LB ADL, grooming, toileting, and shower transfers within precautions. All education provided and questions answered. Recommending discharge home with no OT follow up at this time. OT to sign off. Please re-consult if change in status.        If plan is discharge home, recommend the following:   A little help with bathing/dressing/bathroom;Assistance with cooking/housework;Assist for transportation;Help with stairs or ramp for entrance     Functional Status Assessment   Patient has had a recent decline in their functional status and demonstrates the ability to make significant improvements in function in a reasonable and predictable amount of time.     Equipment Recommendations   None recommended by OT     Recommendations for Other Services         Precautions/Restrictions   Precautions Precautions: Back Precaution Booklet Issued: Yes (comment) Recall of Precautions/Restrictions: Intact Precaution/Restrictions Comments: Educated on 3/3 back precautions. Handout provided. Required Braces or Orthoses: Spinal Brace Spinal Brace: Lumbar corset;Applied in standing position Restrictions Weight Bearing Restrictions Per Provider Order: No     Mobility Bed Mobility Overal bed mobility: Modified Independent                  Transfers Overall transfer level: Modified independent Equipment used: None                General transfer comment: increased time to power up      Balance Overall balance assessment: No apparent balance deficits (not formally assessed)                                         ADL either performed or assessed with clinical judgement   ADL Overall ADL's : Needs assistance/impaired Eating/Feeding: Independent   Grooming: Modified independent;Standing   Upper Body Bathing: Modified independent   Lower Body Bathing: Minimal assistance;Sit to/from stand   Upper Body Dressing : Modified independent;Standing   Lower Body Dressing: Minimal assistance;With adaptive equipment;Sit to/from stand   Toilet Transfer: Modified Independent   Toileting- Clothing Manipulation and Hygiene: Modified independent;Sit to/from stand;Sitting/lateral lean   Tub/ Shower Transfer: Supervision/safety;Tub transfer;Ambulation   Functional mobility during ADLs: Modified independent       Vision Patient Visual Report: No change from baseline       Perception         Praxis         Pertinent Vitals/Pain Pain Assessment Pain Assessment: Faces Faces Pain Scale: Hurts whole lot Pain Location: back Pain Descriptors / Indicators: Grimacing, Guarding Pain Intervention(s): Limited activity within patient's tolerance, Monitored during session     Extremity/Trunk Assessment Upper Extremity Assessment Upper Extremity Assessment: Overall WFL for tasks assessed   Lower Extremity Assessment Lower Extremity Assessment: Defer to PT evaluation RLE Deficits / Details: numbness R thigh and foot   Cervical / Trunk Assessment Cervical / Trunk Assessment: Back  Surgery   Communication Communication Communication: No apparent difficulties   Cognition Arousal: Alert Behavior During Therapy: WFL for tasks assessed/performed Cognition: No apparent impairments                               Following commands: Intact       Cueing  General Comments       VSS   Exercises     Shoulder Instructions      Home Living Family/patient expects to be discharged to:: Private residence Living Arrangements: Spouse/significant other;Children Available Help at Discharge: Family;Available 24 hours/day Type of Home: House Home Access: Stairs to enter [employer] of Steps: 5 Entrance Stairs-Rails: Right;Left;Can reach both Home Layout: One level     Bathroom Shower/Tub: Chief Strategy Officer: Standard     Home Equipment: Agricultural consultant (2 wheels);Cane - single point          Prior Functioning/Environment Prior Level of Function : Independent/Modified Independent;Driving;Working/employed                    OT Problem List: Decreased strength;Decreased activity tolerance;Impaired balance (sitting and/or standing);Decreased knowledge of precautions;Decreased knowledge of use of DME or AE;Pain   OT Treatment/Interventions:        OT Goals(Current goals can be found in the care plan section)   Acute Rehab OT Goals Patient Stated Goal: get better OT Goal Formulation: With patient Time For Goal Achievement: 01/16/24 Potential to Achieve Goals: Good   OT Frequency:       Co-evaluation              AM-PAC OT 6 Clicks Daily Activity     Outcome Measure Help from another person eating meals?: None Help from another person taking care of personal grooming?: None Help from another person toileting, which includes using toliet, bedpan, or urinal?: None Help from another person bathing (including washing, rinsing, drying)?: A Little Help from another person to put on and taking off regular upper body clothing?: None Help from another person to put on and taking off regular lower body clothing?: A Little 6 Click Score: 22   End of Session Equipment Utilized During Treatment: Gait belt;Back brace Nurse Communication: Mobility status  Activity Tolerance: Patient tolerated treatment well Patient  left: in bed;with call bell/phone within reach;with family/visitor present  OT Visit Diagnosis: Unsteadiness on feet (R26.81);Muscle weakness (generalized) (M62.81);Pain Pain - part of body:  (back)                Time: 9244-9179 OT Time Calculation (min): 25 min Charges:  OT General Charges $OT Visit: 1 Visit OT Evaluation $OT Eval Low Complexity: 1 Low OT Treatments $Self Care/Home Management : 8-22 mins  Elma JONETTA Lebron FREDERICK, OTR/L [facility] Acute Rehabilitation Office: [phone]   Elma JONETTA Lebron 01/02/2024, 9:16 AM

## 2024-01-02 NOTE — Progress Notes (Signed)
    Subjective: Procedure(s) (LRB): TRANSFORAMINAL LUMBAR INTERBODY FUSION (TLIF) WITH PEDICLE SCREW FIXATION 1 LEVEL (N/A) 1 Day Post-Op  Patient reports pain as 4 on 0-10 scale.  Reports decreased leg pain reports incisional back pain   Positive void Negative bowel movement Positive flatus Negative chest pain or shortness of breath  Objective: Vital signs in last 24 hours: Temp:  [98 F (36.7 C)-99.3 F (37.4 C)] 99.3 F (37.4 C) (07/01 0323) Pulse Rate:  [88-102] 89 (07/01 0323) Resp:  [12-22] 18 (07/01 0323) BP: (112-144)/(61-92) 130/78 (07/01 0323) SpO2:  [97 %-100 %] 99 % (07/01 0323)  Intake/Output from previous day: 06/30 0701 - 07/01 0700 In: 2750 [P.O.:480; I.V.:1800; IV Piggyback:470] Out: 2010 [Urine:1810; Blood:200]  Labs: No results for input(s): WBC, RBC, HCT, PLT in the last 72 hours. No results for input(s): NA, K, CL, CO2, BUN, CREATININE, GLUCOSE, CALCIUM  in the last 72 hours. No results for input(s): LABPT, INR in the last 72 hours.  Physical Exam: Neurologically intact Neurovascular intact Sensation intact distally Intact pulses distally Dorsiflexion/Plantar flexion intact Incision: dressing C/D/I No cellulitis present Compartment soft Body mass index is 32.69 kg/m.   Assessment/Plan: Wise is a very pleasant 53 year old male who is POD 1 from TLIF L4-L5.  Surgical intervention was successful and without complications. His hospital course has been unco,plicated. Patient is stable.  Patient states that his preoperative radicular leg pain is improved.  He has 5/5 strength in lower extremities.  Dressing is C/D/I. Patient is tolerating oral intake well. Patient is compliant with LSO brace and incentive spirometry. Patient states that oral medications are controlling his pain.    Patient is independently ambulating. Positive void, positive flatus negative BM.  Patient likely can be discharged this  afternoon.  Plan: Patient stable  Continue mobilization with physical therapy Continue care  Plan to d/c to home this afternoon pending PT clearance   Celestia Duva Candance PA-C for Donaciano Sprang, MD [facility] [phone]

## 2024-01-02 NOTE — Discharge Summary (Signed)
 Patient ID: Anthony Reese MRN: [id number] DOB/AGE: 1971-04-14 53 y.o.  Admit date: 01/01/2024 Discharge date: 01/02/2024  Admission Diagnoses:  Principal Problem:   S/P lumbar fusion   Discharge Diagnoses:  Principal Problem:   S/P lumbar fusion  status post Procedure(s): TRANSFORAMINAL LUMBAR INTERBODY FUSION (TLIF) WITH PEDICLE SCREW FIXATION 1 LEVEL  Past Medical History:  Diagnosis Date   Allergy    Anxiety    Arthritis    Chicken pox    Dysrhythmia    Tachycardia   H/O tobacco use, presenting hazards to health 07/31/2011   Headache, cluster, episodic    none in several years   Injury of left shoulder 01/30/2013   Low testosterone     Nasal polyp 11/18/2013   On home oxygen therapy    ONLY uses for cluster headaches   Other and unspecified hyperlipidemia 11/18/2013   Overweight(278.02)    Oxygen deficiency    Preventative health care 11/18/2013   Radiculopathy affecting upper extremity 11/20/2011   Tobacco use disorder 07/31/2011    Surgeries: Procedure(s): TRANSFORAMINAL LUMBAR INTERBODY FUSION (TLIF) WITH PEDICLE SCREW FIXATION 1 LEVEL on 01/01/2024   Consultants:   Discharged Condition: Improved  Hospital Course: Anthony Reese is an 53 y.o. male who was admitted 01/01/2024 for operative treatment of S/P lumbar fusion. Patient failed conservative treatments (please see the history and physical for the specifics) and had severe unremitting pain that affects sleep, daily activities and work/hobbies. After pre-op clearance, the patient was taken to the operating room on 01/01/2024 and underwent  Procedure(s): TRANSFORAMINAL LUMBAR INTERBODY FUSION (TLIF) WITH PEDICLE SCREW FIXATION 1 LEVEL.    Patient was given perioperative antibiotics:  Anti-infectives (From admission, onward)    Start     Dose/Rate Route Frequency Ordered Stop   01/01/24 2000  ceFAZolin  (ANCEF ) IVPB 1 g/50 mL premix        1 g 100 mL/hr over 30 Minutes Intravenous Every 8 hours 01/01/24  1406 01/02/24 0332   01/01/24 0541  ceFAZolin  (ANCEF ) IVPB 2g/100 mL premix        2 g 200 mL/hr over 30 Minutes Intravenous 30 min pre-op 01/01/24 0541 01/01/24 1204        Patient was given sequential compression devices and early ambulation to prevent DVT.   Patient benefited maximally from hospital stay and there were no complications. At the time of discharge, the patient was urinating/moving their bowels without difficulty, tolerating a regular diet, pain is controlled with oral pain medications and they have been cleared by PT/OT.   Recent vital signs: Patient Vitals for the past 24 hrs:  BP Temp Temp src Pulse Resp SpO2  01/02/24 0323 130/78 99.3 F (37.4 C) Oral 89 18 99 %  01/01/24 2253 130/82 98.9 F (37.2 C) Oral 88 18 97 %  01/01/24 1924 (!) 142/82 98.1 F (36.7 C) Oral 91 18 99 %  01/01/24 1558 (!) 144/84 98 F (36.7 C) Oral 89 20 97 %  01/01/24 1415 (!) 131/92 98 F (36.7 C) -- 88 18 98 %  01/01/24 1315 112/86 98.1 F (36.7 C) -- 100 12 99 %  01/01/24 1310 (!) 140/81 -- -- 95 14 100 %  01/01/24 1305 129/89 -- -- 95 14 99 %  01/01/24 1300 132/61 -- -- 96 17 97 %  01/01/24 1245 130/78 -- -- 96 (!) 22 98 %  01/01/24 1230 129/88 98.2 F (36.8 C) -- (!) 102 18 98 %     Recent laboratory studies: No results for input(s):  WBC, HGB, HCT, PLT, NA, K, CL, CO2, BUN, CREATININE, GLUCOSE, INR, CALCIUM  in the last 72 hours.  Invalid input(s): PT, 2   Discharge Medications:   Allergies as of 01/02/2024       Reactions   Imitrex [sumatriptan] Anaphylaxis   Swelling around eyes and shortness of breath   Verapamil Anaphylaxis   Swelling around eyes and shortness of breath   Penicillins Other (See Comments)   Unknown reaction as a child. Tolerated Cephalosporin Date: 12/07/22.        Medication List     STOP taking these medications    HYDROcodone -acetaminophen  5-325 MG tablet Commonly known as: NORCO/VICODIN       TAKE these  medications    cetirizine  10 MG tablet Commonly known as: ZYRTEC  Take 10 mg by mouth daily as needed for allergies.   methocarbamol  500 MG tablet Commonly known as: ROBAXIN  Take 1 tablet (500 mg total) by mouth every 8 (eight) hours as needed for up to 5 days for muscle spasms.   ondansetron  4 MG tablet Commonly known as: Zofran  Take 1 tablet (4 mg total) by mouth every 8 (eight) hours as needed for nausea or vomiting.   oxyCODONE -acetaminophen  10-325 MG tablet Commonly known as: Percocet Take 1 tablet by mouth every 6 (six) hours as needed for up to 5 days for pain.   testosterone  cypionate 200 MG/ML injection Commonly known as: DEPOTESTOSTERONE CYPIONATE Inject 1 mL (200 mg total) into the muscle every 14 (fourteen) days. What changed: when to take this        Diagnostic Studies: DG Lumbar Spine 2-3 Views Result Date: 01/01/2024 CLINICAL DATA:  Elective surgery. EXAM: LUMBAR SPINE - 2-3 VIEW COMPARISON:  MRI 11/08/2023 FINDINGS: Four fluoroscopic spot views of the lumbar spine submitted from the operating room. Posterior rod and pedicle screw fixation with interbody spacer at L4-L5. Fluoroscopy time 4 minutes 35 seconds. Dose 200.62 mGy. IMPRESSION: Intraoperative fluoroscopy during L4-L5 fusion. Electronically Signed   By: Andrea Gasman M.D.   On: 01/01/2024 13:50   DG C-Arm 1-60 Min-No Report Result Date: 01/01/2024 Fluoroscopy was utilized by the requesting physician.  No radiographic interpretation.   DG C-Arm 1-60 Min-No Report Result Date: 01/01/2024 Fluoroscopy was utilized by the requesting physician.  No radiographic interpretation.   DG C-Arm 1-60 Min-No Report Result Date: 01/01/2024 Fluoroscopy was utilized by the requesting physician.  No radiographic interpretation.   DG C-Arm 1-60 Min-No Report Result Date: 01/01/2024 Fluoroscopy was utilized by the requesting physician.  No radiographic interpretation.       Follow-up Information     Burnetta Aures,  MD. Schedule an appointment as soon as possible for a visit in 2 week(s).   Specialty: Orthopedic Surgery Why: If symptoms worsen, For wound re-check, For suture removal Contact information: [address] [place] [address] [phone]                 Discharge Plan:  discharge to home today pending PT clearance  Disposition:  Anthony Reese is a very pleasant 53 year old male who is POD 1 from TLIF L4-L5.  Surgical intervention was successful and without complications. His hospital course has been unco,plicated. Patient is stable.  Patient states that his preoperative radicular leg pain is improved.  He has 5/5 strength in lower extremities.  Dressing is C/D/I. Patient is tolerating oral intake well. Patient is compliant with LSO brace and incentive spirometry. Patient states that oral medications are controlling his pain.    Patient  is independently ambulating. Positive void, positive flatus negative BM.  Patient will follow up in the office with us  in 2 weeks post-op. Patient understands that they can shower on POD 5. All questions were welcomed and answered.    Signed: Jeoffrey LOISE Sages for Dr. Donaciano Sprang [facility] [phone] 01/02/2024, 7:39 AM

## 2024-01-15 ENCOUNTER — Other Ambulatory Visit: Payer: Self-pay | Admitting: Family

## 2024-01-15 DIAGNOSIS — E349 Endocrine disorder, unspecified: Secondary | ICD-10-CM

## 2024-01-16 DIAGNOSIS — G8918 Other acute postprocedural pain: Secondary | ICD-10-CM | POA: Insufficient documentation

## 2024-01-19 ENCOUNTER — Ambulatory Visit: Admitting: Student

## 2024-01-19 ENCOUNTER — Encounter: Payer: Self-pay | Admitting: Student

## 2024-01-19 VITALS — BP 120/78 | Temp 98.1°F | Ht 68.0 in | Wt 213.8 lb

## 2024-01-19 DIAGNOSIS — Z0000 Encounter for general adult medical examination without abnormal findings: Secondary | ICD-10-CM | POA: Insufficient documentation

## 2024-01-19 DIAGNOSIS — E349 Endocrine disorder, unspecified: Secondary | ICD-10-CM

## 2024-01-19 DIAGNOSIS — E785 Hyperlipidemia, unspecified: Secondary | ICD-10-CM | POA: Diagnosis not present

## 2024-01-19 LAB — COMPREHENSIVE METABOLIC PANEL WITH GFR
ALT: 37 U/L (ref 0–53)
AST: 20 U/L (ref 0–37)
Albumin: 4.4 g/dL (ref 3.5–5.2)
Alkaline Phosphatase: 75 U/L (ref 39–117)
BUN: 18 mg/dL (ref 6–23)
CO2: 27 meq/L (ref 19–32)
Calcium: 9.7 mg/dL (ref 8.4–10.5)
Chloride: 101 meq/L (ref 96–112)
Creatinine, Ser: 0.82 mg/dL (ref 0.40–1.50)
GFR: 101.02 mL/min (ref >60.00)
Glucose, Bld: 104 mg/dL — ABNORMAL HIGH (ref 70–99)
Potassium: 4.9 meq/L (ref 3.5–5.1)
Sodium: 137 meq/L (ref 135–145)
Total Bilirubin: 0.5 mg/dL (ref 0.2–1.2)
Total Protein: 7.2 g/dL (ref 6.0–8.3)

## 2024-01-19 LAB — HEMOGLOBIN A1C: Hgb A1c MFr Bld: 6 % (ref 4.6–6.5)

## 2024-01-19 LAB — CBC WITH DIFFERENTIAL/PLATELET
Basophils Absolute: 0.2 K/uL — ABNORMAL HIGH (ref 0.0–0.1)
Basophils Relative: 2.7 % (ref 0.0–3.0)
Eosinophils Absolute: 0.5 K/uL (ref 0.0–0.7)
Eosinophils Relative: 8.1 % — ABNORMAL HIGH (ref 0.0–5.0)
HCT: 55.1 % — ABNORMAL HIGH (ref 39.0–52.0)
Hemoglobin: 18.3 g/dL (ref 13.0–17.0)
Lymphocytes Relative: 25.3 % (ref 12.0–46.0)
Lymphs Abs: 1.6 K/uL (ref 0.7–4.0)
MCHC: 33.2 g/dL (ref 30.0–36.0)
MCV: 100.6 fl — ABNORMAL HIGH (ref 78.0–100.0)
Monocytes Absolute: 0.6 K/uL (ref 0.1–1.0)
Monocytes Relative: 9.6 % (ref 3.0–12.0)
Neutro Abs: 3.4 K/uL (ref 1.4–7.7)
Neutrophils Relative %: 54.3 % (ref 43.0–77.0)
Platelets: 391 K/uL (ref 150.0–400.0)
RBC: 5.48 Mil/uL (ref 4.22–5.81)
RDW: 13.3 % (ref 11.5–15.5)
WBC: 6.3 K/uL (ref 4.0–10.5)

## 2024-01-19 LAB — LIPID PANEL
Cholesterol: 301 mg/dL — ABNORMAL HIGH (ref 0–200)
HDL: 58.1 mg/dL
LDL Cholesterol: 192 mg/dL — ABNORMAL HIGH (ref 0–99)
NonHDL: 242.91
Total CHOL/HDL Ratio: 5
Triglycerides: 253 mg/dL — ABNORMAL HIGH (ref 0.0–149.0)
VLDL: 50.6 mg/dL — ABNORMAL HIGH (ref 0.0–40.0)

## 2024-01-19 LAB — TESTOSTERONE: Testosterone: 871.36 ng/dL (ref 300.00–890.00)

## 2024-01-19 LAB — TSH: TSH: 1.64 u[IU]/mL (ref 0.35–5.50)

## 2024-01-19 LAB — PSA: PSA: 1.98 ng/mL (ref 0.10–4.00)

## 2024-01-19 NOTE — Progress Notes (Signed)
 Subjective:     Patient ID: Anthony Reese, male    DOB: 1970-07-26, 53 y.o.   MRN: [id number]  Chief Complaint  Patient presents with   Follow-up    Testosterone  deficiency     HPI   History of Present Illness        Anthony Reese a 53 y.o.  male today for a complete physical exam.  Overall feels well.  Pt reports consuming a healthy diet, eat lean protein low carb. Sleeping well 7-8 hours.  Recently lumbar fusion surgery in June 2025, reports lumbar pain has improved and sleeping much better after surgery.  Will be starting PT-starts 6 weeks after Sx. Former smoker, not currently smoking.  EtOH use >3 days per week.  Sees dentist and eye doctor regularly.  New Surgeries or Family Hx: Lumbar fusion Sx 2025 Followed by [facility]- Dr. Burnetta Gaba  Testosterone  deficiency  Lab Results  Component Value Date   TESTOSTERONE  197.02 (L) 11/15/2022    Patient denies fever, chills, SOB, CP, palpitations, dyspnea, edema, HA, vision changes, N/V/D, abdominal pain, urinary symptoms, rash, weight changes, and recent illness or hospitalizations.       Health Maintenance Due  Topic Date Due   Hepatitis B Vaccines (1 of 3 - 19+ 3-dose series) Never done    Past Medical History:  Diagnosis Date   Allergy    Anxiety    Arthritis    Chicken pox    Dysrhythmia    Tachycardia   H/O tobacco use, presenting hazards to health 07/31/2011   Headache, cluster, episodic    none in several years   Injury of left shoulder 01/30/2013   Low testosterone     Nasal polyp 11/18/2013   On home oxygen therapy    ONLY uses for cluster headaches   Other and unspecified hyperlipidemia 11/18/2013   Overweight(278.02)    Oxygen deficiency    Preventative health care 11/18/2013   Radiculopathy affecting upper extremity 11/20/2011   Tobacco use disorder 07/31/2011    Past Surgical History:  Procedure Laterality Date   APPENDECTOMY     BICEPS TENDON REPAIR  2012   right   CARPAL TUNNEL  RELEASE Bilateral 2013   right   KNEE SURGERY Right 2013   acl   NASAL SINUS SURGERY  2016   NASAL SINUS SURGERY     x2   TOTAL KNEE ARTHROPLASTY Right 02/19/2016   Procedure: RIGHT TOTAL KNEE ARTHROPLASTY;  Surgeon: Norleen Gavel, MD;  Location: [facility];  Service: Orthopedics;  Laterality: Right;   TOTAL KNEE ARTHROPLASTY Left 12/05/2022   Procedure: TOTAL KNEE ARTHROPLASTY;  Surgeon: Melodi Lerner, MD;  Location: [facility];  Service: Orthopedics;  Laterality: Left;   TOTAL KNEE REVISION Right 04/07/2021   Procedure: RIGHT TOTAL KNEE REVISION;  Surgeon: Melodi Lerner, MD;  Location: [facility];  Service: Orthopedics;  Laterality: Right;   TOTAL KNEE REVISION Right 12/05/2022   Procedure: Right knee polyethylene revision;  Surgeon: Melodi Lerner, MD;  Location: [facility];  Service: Orthopedics;  Laterality: Right;   TRANSFORAMINAL LUMBAR INTERBODY FUSION (TLIF) WITH PEDICLE SCREW FIXATION 1 LEVEL N/A 01/01/2024   Procedure: TRANSFORAMINAL LUMBAR INTERBODY FUSION (TLIF) WITH PEDICLE SCREW FIXATION 1 LEVEL;  Surgeon: Burnetta Aures, MD;  Location: [facility];  Service: Orthopedics;  Laterality: N/A;  Transforaminal lumbar interbody fusion L4-L5    Family History  Problem Relation Age of Onset   Cancer Mother        leukemia, pancreatic cancer   Cholelithiasis Sister  Cancer Maternal Aunt        lung   Colon polyps Maternal Grandfather    Colon cancer Maternal Grandfather    Cancer Maternal Grandfather        colon    Esophageal cancer Neg Hx    Stomach cancer Neg Hx    Rectal cancer Neg Hx     Social History   Socioeconomic History   Marital status: Married    Spouse name: Not on file   Number of children: Not on file   Years of education: Not on file   Highest education level: Not on file  Occupational History   Not on file  Tobacco Use   Smoking status: Former    Types: Cigars    Quit date: 2025    Years since quitting: 0.5   Smokeless tobacco: Never   Tobacco comments:    No  cigarettes, occasional cigar  Vaping Use   Vaping status: Never Used  Substance and Sexual Activity   Alcohol use: Not Currently    Comment: few times per week- vodka/water    Drug use: No   Sexual activity: Yes    Partners: Female    Comment: lives with wife, works for [employer], no dietary restrictions.  Other Topics Concern   Not on file  Social History Narrative   Not on file   Social Drivers of Health   Financial Resource Strain: Not on file  Food Insecurity: No Food Insecurity (12/06/2022)   Hunger Vital Sign    Worried About Running Out of Food in the Last Year: Never true    Ran Out of Food in the Last Year: Never true  Transportation Needs: No Transportation Needs (12/06/2022)   PRAPARE - Administrator, Civil Service (Medical): No    Lack of Transportation (Non-Medical): No  Physical Activity: Not on file  Stress: Not on file  Social Connections: Not on file  Intimate Partner Violence: Not At Risk (12/06/2022)   Humiliation, Afraid, Rape, and Kick questionnaire    Fear of Current or Ex-Partner: No    Emotionally Abused: No    Physically Abused: No    Sexually Abused: No    Outpatient Medications Prior to Visit  Medication Sig Dispense Refill   cetirizine  (ZYRTEC ) 10 MG tablet Take 10 mg by mouth daily as needed for allergies.     methocarbamol  (ROBAXIN ) 500 MG tablet Take 1 tablet 3 times a day by oral route as needed for 14 days.     PERCOCET 10-325 MG tablet Take 1 tablet 3 times a day by oral route as needed for 14 days.     testosterone  cypionate (DEPOTESTOSTERONE CYPIONATE) 200 MG/ML injection Inject 1 mL (200 mg total) into the muscle every 14 (fourteen) days. (Patient taking differently: Inject 200 mg into the muscle every 14 (fourteen) days. Patient taking a 1/2 week) 4 mL 0   ondansetron  (ZOFRAN ) 4 MG tablet Take 1 tablet (4 mg total) by mouth every 8 (eight) hours as needed for nausea or vomiting. (Patient not taking: Reported  on 01/19/2024) 20 tablet 0   No facility-administered medications prior to visit.    Allergies  Allergen Reactions   Imitrex [Sumatriptan] Anaphylaxis    Swelling around eyes and shortness of breath   Verapamil Anaphylaxis    Swelling around eyes and shortness of breath   Penicillins Other (See Comments)    Unknown reaction as a child. Tolerated Cephalosporin Date: 12/07/22.  ROS See HPI    Objective:    Physical Exam Constitutional:      General: He is not in acute distress.    Appearance: He is not ill-appearing, toxic-appearing or diaphoretic.  HENT:     Head: Normocephalic and atraumatic.     Comments: Bilateral hearing aids    Right Ear: Tympanic membrane, ear canal and external ear normal.     Left Ear: Tympanic membrane, ear canal and external ear normal.     Nose: Nose normal. No congestion.     Mouth/Throat:     Mouth: Mucous membranes are moist.     Pharynx: Oropharynx is clear.  Eyes:     Extraocular Movements: Extraocular movements intact.     Right eye: Normal extraocular motion.     Left eye: Normal extraocular motion.     Conjunctiva/sclera: Conjunctivae normal.     Pupils: Pupils are equal, round, and reactive to light.  Neck:     Thyroid : No thyroid  mass or thyromegaly.     Vascular: No carotid bruit or JVD.  Cardiovascular:     Rate and Rhythm: Normal rate and regular rhythm.     Pulses: Normal pulses.          Radial pulses are 2+ on the right side and 2+ on the left side.       Dorsalis pedis pulses are 2+ on the right side and 2+ on the left side.     Heart sounds: Normal heart sounds, S1 normal and S2 normal. No murmur heard.    No friction rub. No gallop.  Pulmonary:     Effort: Pulmonary effort is normal. No respiratory distress.     Breath sounds: Normal breath sounds.  Abdominal:     General: Bowel sounds are normal. There is no distension.     Palpations: Abdomen is soft.     Tenderness: There is no abdominal tenderness. There is  no guarding.  Musculoskeletal:        General: Normal range of motion.     Cervical back: Full passive range of motion without pain and normal range of motion. No edema or erythema.     Right lower leg: No edema.     Left lower leg: No edema.  Lymphadenopathy:     Cervical: No cervical adenopathy.  Skin:    General: Skin is warm and dry.     Capillary Refill: Capillary refill takes less than 2 seconds.     Comments: Lumbar fusion Sx incision-healing well, no erythema or signs of infection  Neurological:     General: No focal deficit present.     Mental Status: He is alert and oriented to person, place, and time.     Cranial Nerves: No cranial nerve deficit.     Motor: No weakness.     Coordination: Coordination normal.     Gait: Gait normal.     Deep Tendon Reflexes: Reflexes normal.  Psychiatric:        Mood and Affect: Mood normal.        Behavior: Behavior normal.        Thought Content: Thought content normal.      BP 120/78 (BP Location: Left Arm, Patient Position: Sitting, Cuff Size: Normal)   Temp 98.1 F (36.7 C)   Ht 5' 8 (1.727 m)   Wt 213 lb 12.8 oz (97 kg)   SpO2 95%   BMI 32.51 kg/m  Wt Readings from Last 3 Encounters:  01/19/24 213  lb 12.8 oz (97 kg)  01/01/24 215 lb (97.5 kg)  12/20/23 218 lb 12.8 oz (99.2 kg)       Assessment & Plan:   Problem List Items Addressed This Visit     Annual visit for general adult medical examination without abnormal findings - Primary   Relevant Orders   Testosterone    Lipid panel   PSA   TSH   Comprehensive metabolic panel with GFR   CBC with Differential/Platelet   Hemoglobin A1c   Hyperlipidemia   Repeat lipid panel today.  Has not been taking statin medication. Encourage heart healthy diet such as MIND or DASH diet, increase exercise, avoid trans fats, simple carbohydrates and processed foods, consider a krill or fish or flaxseed oil cap daily.        Relevant Orders   Lipid panel   Comprehensive  metabolic panel with GFR   Testosterone  deficiency   Supplement and monitor.  Labs updated and reviewed.      Relevant Orders   Testosterone    TSH   CBC with Differential/Platelet    Portions of this note were dictated using DRAGON voice recognition software. Please disregard any errors in transcription.    I have discontinued Redell Room ondansetron . I am also having him maintain his testosterone  cypionate, cetirizine , methocarbamol , and Percocet.  No orders of the defined types were placed in this encounter.

## 2024-01-19 NOTE — Assessment & Plan Note (Signed)
 Encouraged patient to maintain a healthy lifestyle, including regular physical activity, adequate hydration, and a well-balanced diet. Advised limiting processed foods, added sugars, and sodium intake. Reinforced the importance of sleep, stress management, and routine preventive care.   HCM Colonoscopy-last in 2024.  Immunizations: DTaP, COVID-19, shingles due-patient declines today

## 2024-01-19 NOTE — Assessment & Plan Note (Signed)
 Supplement and monitor.  Labs updated and reviewed.

## 2024-01-19 NOTE — Assessment & Plan Note (Signed)
 Repeat lipid panel today.  Has not been taking statin medication. Encourage heart healthy diet such as MIND or DASH diet, increase exercise, avoid trans fats, simple carbohydrates and processed foods, consider a krill or fish or flaxseed oil cap daily.

## 2024-01-19 NOTE — Progress Notes (Deleted)
 Acute Office Visit  Subjective:     Patient ID: Gabriela Giannelli, male    DOB: [birth year], 53 y.o.   MRN: [id number]  No chief complaint on file.   HPI   Abdo Denault a 53 y.o.  male/ presents today for a complete physical exam. Pt reports consuming a {diet types:17450} diet. {types:19826} Pt generally feels {DESC; WELL/FAIRLY WELL/POORLY:18703}. Reports sleeping {DESC; WELL/FAIRLY WELL/POORLY:18703}.  {does/does not:200015} have additional problems to discuss today.   New Surgeries or Family Hx: ***Yes/ Denies  Habits: ETOH, illicit drugs, smoking, secondhand exposure, caffeine     HCM:   Colonoscopy: Last 08/2022-repeat??   Testosterone  deficiency  Lab Results  Component Value Date   TESTOSTERONE  197.02 (L) 11/15/2022     Patient denies fever, chills, SOB, CP, palpitations, dyspnea, edema, HA, vision changes, N/V/D, abdominal pain, urinary symptoms, rash, weight changes, and recent illness or hospitalizations.    ROS  See HPI      Objective:    There were no vitals taken for this visit. {Vitals History (Optional):23777}  Physical Exam Constitutional:      General: He is not in acute distress.    Appearance: He is not ill-appearing, toxic-appearing or diaphoretic.  HENT:     Head: Normocephalic and atraumatic.     Right Ear: Tympanic membrane, ear canal and external ear normal.     Left Ear: Tympanic membrane, ear canal and external ear normal.     Nose: Nose normal. No congestion.     Mouth/Throat:     Mouth: Mucous membranes are moist.     Pharynx: Oropharynx is clear.  Eyes:     Extraocular Movements: Extraocular movements intact.     Right eye: Normal extraocular motion.     Left eye: Normal extraocular motion.     Conjunctiva/sclera: Conjunctivae normal.     Pupils: Pupils are equal, round, and reactive to light.  Neck:     Thyroid : No thyroid  mass or thyromegaly.     Vascular: No carotid bruit or JVD.  Cardiovascular:     Rate and Rhythm: Normal  rate and regular rhythm.     Pulses: Normal pulses.          Radial pulses are 2+ on the right side and 2+ on the left side.       Dorsalis pedis pulses are 2+ on the right side and 2+ on the left side.     Heart sounds: Normal heart sounds, S1 normal and S2 normal. No murmur heard.    No friction rub. No gallop.  Pulmonary:     Effort: Pulmonary effort is normal. No respiratory distress.     Breath sounds: Normal breath sounds.  Abdominal:     General: Bowel sounds are normal. There is no distension.     Palpations: Abdomen is soft.     Tenderness: There is no abdominal tenderness. There is no guarding.  Musculoskeletal:        General: Normal range of motion.     Cervical back: Full passive range of motion without pain and normal range of motion. No edema or erythema.     Right lower leg: No edema.     Left lower leg: No edema.  Lymphadenopathy:     Cervical: No cervical adenopathy.  Skin:    General: Skin is warm and dry.     Capillary Refill: Capillary refill takes less than 2 seconds.  Neurological:     General: No focal deficit present.  Mental Status: He is alert and oriented to person, place, and time.     Cranial Nerves: No cranial nerve deficit.     Motor: No weakness.     Coordination: Coordination normal.     Gait: Gait normal.     Deep Tendon Reflexes: Reflexes normal.  Psychiatric:        Mood and Affect: Mood normal.        Behavior: Behavior normal.        Thought Content: Thought content normal.     No results found for any visits on 01/19/24.      Assessment & Plan:   Problem List Items Addressed This Visit   None   No orders of the defined types were placed in this encounter.   No follow-ups on file.  Domnic Vantol L Melessa Cowell, NP

## 2024-01-22 ENCOUNTER — Telehealth: Payer: Self-pay

## 2024-01-22 ENCOUNTER — Other Ambulatory Visit: Payer: Self-pay | Admitting: Family Medicine

## 2024-01-22 ENCOUNTER — Ambulatory Visit: Payer: Self-pay | Admitting: Student

## 2024-01-22 DIAGNOSIS — R718 Other abnormality of red blood cells: Secondary | ICD-10-CM

## 2024-01-22 DIAGNOSIS — E349 Endocrine disorder, unspecified: Secondary | ICD-10-CM

## 2024-01-22 DIAGNOSIS — E785 Hyperlipidemia, unspecified: Secondary | ICD-10-CM

## 2024-01-22 MED ORDER — TESTOSTERONE CYPIONATE 200 MG/ML IM SOLN: 200.0000 mg | INTRAMUSCULAR | 0 refills | Status: DC

## 2024-01-22 MED ORDER — ROSUVASTATIN CALCIUM 40 MG PO TABS: 40.0000 mg | ORAL_TABLET | Freq: Every day | ORAL | 1 refills | Status: DC

## 2024-01-22 NOTE — Telephone Encounter (Signed)
 Lab Name [facility] Phone Number [phone] Lab Tech Name Carmena Lab Reference Number N/a Chief Complaint Lab Result (Critical or Stat) Call Type Lab Send to RN Reason for Call Report lab results Initial Comment Caller states they need tom report a critical value. Translation No Nurse Assessment Nurse: Ronelle, RN, Destiny Date/Time (Eastern Time): 01/19/2024 5:22:33 PM You have opened lab assessment, do you wish to continue? ---Yes Is there an on-call provider listed? ---Yes Please list name of person reporting value (Lab Employee) and a contact number. ---Carmena from [facility] [phone] Please document the following items: Lab name Lab value (read back to lab to verify) Reference range for lab value Date and time blood was drawn Collect time of birth for bilirubin results ---lab: hgb 18.3 ref range: 13-17 date/time: 7/18 9:17am contact #: [phone] MRN #: [id number] ordering provider: Harlene Leatherwood NP Disp. Time Titus Time) Disposition Final User 01/19/2024 5:32:08 PM Paged On Call back to [facility], RN, Destiny 01/19/2024 5:41:52 PM Lab Call Ronelle, RN, Destiny Reason: crit. labs 01/19/2024 5:42:05 PM Clinical Call Yes Ronelle, RN, Destiny Final Disposition 01/19/2024 5:42:05 PM Clinical Call Yes Loar, RN, Destiny PLEASE NOTE: All timestamps contained within this report are represented as [place] Standard Time. CONFIDENTIALTY NOTICE: This fax transmission is intended only for the addressee. It contains information that is legally privileged, confidential or otherwise protected from use or disclosure. If you are not the intended recipient, you are strictly prohibited from reviewing, disclosing, copying using or disseminating any of this information or taking any action in reliance on or regarding this information. If you have received this fax in error, please notify us  immediately by telephone so that we can arrange for its return to us . Phone: [phone],  Toll-Free: [phone], Fax: [phone] [facility] [birth year] Page: 1 of2 CallId: [id number] Paging DoctorName Phone DateTime Result/ Outcome Message Type Notes Ozell Pleasure [phone] 01/19/2024 5:32:08 PM Paged On Call Back to Call Center Doctor Paged Ozell Pleasure 01/19/2024 5:41:09 PM Spoke with On Call - General Message Result crit. lab reported to [facility],

## 2024-01-23 MED ORDER — ASPIRIN 81 MG PO TBEC: 81.0000 mg | DELAYED_RELEASE_TABLET | Freq: Every day | ORAL | Status: DC

## 2024-03-20 ENCOUNTER — Encounter: Payer: Self-pay | Admitting: Family Medicine

## 2024-04-03 ENCOUNTER — Other Ambulatory Visit: Payer: Self-pay | Admitting: Family Medicine

## 2024-04-03 DIAGNOSIS — E349 Endocrine disorder, unspecified: Secondary | ICD-10-CM

## 2024-04-03 NOTE — Telephone Encounter (Signed)
 Requesting: testosterone   Contract: n/a UDS: n/a  Last Visit: 01/19/24 w/ Wheeler  Next Visit: 07/22/24 Last Refill: 01/22/24 #50mL and 0RF   Please Advise

## 2024-04-04 ENCOUNTER — Other Ambulatory Visit (INDEPENDENT_AMBULATORY_CARE_PROVIDER_SITE_OTHER)

## 2024-04-04 DIAGNOSIS — E785 Hyperlipidemia, unspecified: Secondary | ICD-10-CM | POA: Diagnosis not present

## 2024-04-04 DIAGNOSIS — R718 Other abnormality of red blood cells: Secondary | ICD-10-CM

## 2024-04-04 DIAGNOSIS — E349 Endocrine disorder, unspecified: Secondary | ICD-10-CM | POA: Diagnosis not present

## 2024-04-04 LAB — CBC WITH DIFFERENTIAL/PLATELET
Basophils Absolute: 0.1 K/uL (ref 0.0–0.1)
Basophils Relative: 1.3 % (ref 0.0–3.0)
Eosinophils Absolute: 0.4 K/uL (ref 0.0–0.7)
Eosinophils Relative: 7 % — ABNORMAL HIGH (ref 0.0–5.0)
HCT: 52.1 % — ABNORMAL HIGH (ref 39.0–52.0)
Hemoglobin: 17.4 g/dL — ABNORMAL HIGH (ref 13.0–17.0)
Lymphocytes Relative: 29.4 % (ref 12.0–46.0)
Lymphs Abs: 1.6 K/uL (ref 0.7–4.0)
MCHC: 33.4 g/dL (ref 30.0–36.0)
MCV: 98.9 fl (ref 78.0–100.0)
Monocytes Absolute: 0.7 K/uL (ref 0.1–1.0)
Monocytes Relative: 12.1 % — ABNORMAL HIGH (ref 3.0–12.0)
Neutro Abs: 2.8 K/uL (ref 1.4–7.7)
Neutrophils Relative %: 50.2 % (ref 43.0–77.0)
Platelets: 305 K/uL (ref 150.0–400.0)
RBC: 5.27 Mil/uL (ref 4.22–5.81)
RDW: 14.5 % (ref 11.5–15.5)
WBC: 5.6 K/uL (ref 4.0–10.5)

## 2024-04-04 LAB — LIPID PANEL
Cholesterol: 285 mg/dL — ABNORMAL HIGH (ref 0–200)
HDL: 50.5 mg/dL (ref >39.00)
LDL Cholesterol: 191 mg/dL — ABNORMAL HIGH (ref 0–99)
NonHDL: 234.65
Total CHOL/HDL Ratio: 6
Triglycerides: 219 mg/dL — ABNORMAL HIGH (ref 0.0–149.0)
VLDL: 43.8 mg/dL — ABNORMAL HIGH (ref 0.0–40.0)

## 2024-04-04 LAB — TESTOSTERONE: Testosterone: 558.64 ng/dL (ref 300.00–890.00)

## 2024-04-05 ENCOUNTER — Ambulatory Visit: Payer: Self-pay | Admitting: Student

## 2024-04-05 DIAGNOSIS — E7800 Pure hypercholesterolemia, unspecified: Secondary | ICD-10-CM

## 2024-04-05 DIAGNOSIS — E785 Hyperlipidemia, unspecified: Secondary | ICD-10-CM

## 2024-04-05 MED ORDER — EZETIMIBE 10 MG PO TABS: 10.0000 mg | ORAL_TABLET | Freq: Every day | ORAL | 1 refills | Status: DC

## 2024-04-08 ENCOUNTER — Other Ambulatory Visit: Payer: Self-pay | Admitting: Student

## 2024-06-21 ENCOUNTER — Encounter: Payer: Self-pay | Admitting: Family Medicine

## 2024-06-21 DIAGNOSIS — E349 Endocrine disorder, unspecified: Secondary | ICD-10-CM

## 2024-06-21 NOTE — Telephone Encounter (Signed)
 Requesting: testosterone   Contract: n/a UDS: n/a  Last Visit: 01/19/24 w/ Harlene GRADE Next Visit: 07/22/24 Last Refill: 04/03/24 #52mL and 1rf   Please Advise

## 2024-06-22 ENCOUNTER — Other Ambulatory Visit: Payer: Self-pay | Admitting: Family

## 2024-06-22 DIAGNOSIS — E349 Endocrine disorder, unspecified: Secondary | ICD-10-CM

## 2024-06-23 ENCOUNTER — Other Ambulatory Visit: Payer: Self-pay | Admitting: Family Medicine

## 2024-06-23 MED ORDER — TESTOSTERONE CYPIONATE 200 MG/ML IM SOLN: 100.0000 mg | INTRAMUSCULAR | 1 refills | Status: AC

## 2024-06-25 ENCOUNTER — Other Ambulatory Visit: Payer: Self-pay | Admitting: Family Medicine

## 2024-07-03 ENCOUNTER — Telehealth: Payer: Self-pay

## 2024-07-03 ENCOUNTER — Other Ambulatory Visit (HOSPITAL_COMMUNITY): Payer: Self-pay

## 2024-07-03 NOTE — Telephone Encounter (Signed)
 Pharmacy Patient Advocate Encounter  Received notification from [facility] that Prior Authorization for Testosterone  200mg /ml has been APPROVED from 06/03/24 to 07/03/25   PA #/Case ID/Reference #: [id number]

## 2024-07-03 NOTE — Telephone Encounter (Signed)
 Pharmacy Patient Advocate Encounter   Received notification from Patient Advice Request messages that prior authorization for Testosterone  200mg /ml is required/requested.   Insurance verification completed.   The patient is insured through [employer].   Per test claim: PA required; PA submitted to above mentioned insurance via Latent Key/confirmation #/EOC [id number] Status is pending

## 2024-07-20 NOTE — Progress Notes (Unsigned)
 "  Subjective:    Patient ID: Anthony Reese, male    DOB: Mar 10, 1971, 54 y.o.   MRN: [id number]  No chief complaint on file.   HPI Discussed the use of AI scribe software for clinical note transcription with the patient, who gave verbal consent to proceed.  History of Present Illness     Past Medical History:  Diagnosis Date   Allergy    Anxiety    Arthritis    Chicken pox    Dysrhythmia    Tachycardia   H/O tobacco use, presenting hazards to health 07/31/2011   Headache, cluster, episodic    none in several years   Injury of left shoulder 01/30/2013   Low testosterone     Nasal polyp 11/18/2013   On home oxygen therapy    ONLY uses for cluster headaches   Other and unspecified hyperlipidemia 11/18/2013   Overweight(278.02)    Oxygen deficiency    Preventative health care 11/18/2013   Radiculopathy affecting upper extremity 11/20/2011   Tobacco use disorder 07/31/2011    Past Surgical History:  Procedure Laterality Date   APPENDECTOMY     BICEPS TENDON REPAIR  2012   right   CARPAL TUNNEL RELEASE Bilateral 2013   right   KNEE SURGERY Right 2013   acl   NASAL SINUS SURGERY  2016   NASAL SINUS SURGERY     x2   TOTAL KNEE ARTHROPLASTY Right 02/19/2016   Procedure: RIGHT TOTAL KNEE ARTHROPLASTY;  Surgeon: Norleen Gavel, MD;  Location: [facility];  Service: Orthopedics;  Laterality: Right;   TOTAL KNEE ARTHROPLASTY Left 12/05/2022   Procedure: TOTAL KNEE ARTHROPLASTY;  Surgeon: Melodi Lerner, MD;  Location: [facility];  Service: Orthopedics;  Laterality: Left;   TOTAL KNEE REVISION Right 04/07/2021   Procedure: RIGHT TOTAL KNEE REVISION;  Surgeon: Melodi Lerner, MD;  Location: [facility];  Service: Orthopedics;  Laterality: Right;   TOTAL KNEE REVISION Right 12/05/2022   Procedure: Right knee polyethylene revision;  Surgeon: Melodi Lerner, MD;  Location: [facility];  Service: Orthopedics;  Laterality: Right;   TRANSFORAMINAL LUMBAR INTERBODY FUSION (TLIF)  WITH PEDICLE SCREW FIXATION 1 LEVEL N/A 01/01/2024   Procedure: TRANSFORAMINAL LUMBAR INTERBODY FUSION (TLIF) WITH PEDICLE SCREW FIXATION 1 LEVEL;  Surgeon: Burnetta Aures, MD;  Location: [facility];  Service: Orthopedics;  Laterality: N/A;  Transforaminal lumbar interbody fusion L4-L5    Family History  Problem Relation Age of Onset   Cancer Mother        leukemia, pancreatic cancer   Cholelithiasis Sister    Cancer Maternal Aunt        lung   Colon polyps Maternal Grandfather    Colon cancer Maternal Grandfather    Cancer Maternal Grandfather        colon    Esophageal cancer Neg Hx    Stomach cancer Neg Hx    Rectal cancer Neg Hx     Social History   Socioeconomic History   Marital status: Married    Spouse name: Not on file   Number of children: Not on file   Years of education: Not on file   Highest education level: Not on file  Occupational History   Not on file  Tobacco Use   Smoking status: Former    Types: Cigars    Quit date: 2025    Years since quitting: 1.0   Smokeless tobacco: Never   Tobacco comments:    No cigarettes, occasional cigar  Vaping Use   Vaping status:  Never Used  Substance and Sexual Activity   Alcohol use: Not Currently    Comment: few times per week- vodka/water    Drug use: No   Sexual activity: Yes    Partners: Female    Comment: lives with wife, works for [employer], no dietary restrictions.  Other Topics Concern   Not on file  Social History Narrative   Not on file   Social Drivers of Health   Tobacco Use: Medium Risk (01/19/2024)   Patient History    Smoking Tobacco Use: Former    Smokeless Tobacco Use: Never    Passive Exposure: Not on file  Financial Resource Strain: Not on file  Food Insecurity: No Food Insecurity (12/06/2022)   Hunger Vital Sign    Worried About Running Out of Food in the Last Year: Never true    Ran Out of Food in the Last Year: Never true  Transportation  Needs: No Transportation Needs (12/06/2022)   PRAPARE - Administrator, Civil Service (Medical): No    Lack of Transportation (Non-Medical): No  Physical Activity: Not on file  Stress: Not on file  Social Connections: Not on file  Intimate Partner Violence: Not At Risk (12/06/2022)   Humiliation, Afraid, Rape, and Kick questionnaire    Fear of Current or Ex-Partner: No    Emotionally Abused: No    Physically Abused: No    Sexually Abused: No  Depression (PHQ2-9): Low Risk (01/19/2024)   Depression (PHQ2-9)    PHQ-2 Score: 2  Alcohol Screen: Not on file  Housing: Patient Declined (12/06/2022)   Housing    Last Housing Risk Score: 0  Utilities: Patient Declined (12/06/2022)   [facility] Utilities    Threatened with loss of utilities: Patient declined  Health Literacy: Not on file    Outpatient Medications Prior to Visit  Medication Sig Dispense Refill   aspirin  EC 81 MG tablet Take 1 tablet (81 mg total) by mouth daily. Swallow whole.     cetirizine  (ZYRTEC ) 10 MG tablet Take 10 mg by mouth daily as needed for allergies.     ezetimibe  (ZETIA ) 10 MG tablet Take 1 tablet (10 mg total) by mouth daily. 90 tablet 1   methocarbamol  (ROBAXIN ) 500 MG tablet Take 1 tablet 3 times a day by oral route as needed for 14 days.     PERCOCET 10-325 MG tablet Take 1 tablet 3 times a day by oral route as needed for 14 days.     testosterone  cypionate (DEPOTESTOSTERONE CYPIONATE) 200 MG/ML injection Inject 0.5 mLs (100 mg total) into the muscle every 14 (fourteen) days. 2 mL 1   testosterone  cypionate (DEPOTESTOSTERONE CYPIONATE) 200 MG/ML injection Inject 0.5 mLs (100 mg total) into the muscle every 14 (fourteen) days. 2 mL 1   No facility-administered medications prior to visit.    Allergies[1]  Review of Systems  Constitutional:  Negative for fever and malaise/fatigue.  HENT:  Negative for congestion.   Eyes:  Negative for blurred vision.  Respiratory:  Negative for shortness of  breath.   Cardiovascular:  Negative for chest pain, palpitations and leg swelling.  Gastrointestinal:  Negative for abdominal pain, blood in stool and nausea.  Genitourinary:  Negative for dysuria and frequency.  Musculoskeletal:  Negative for falls.  Skin:  Negative for rash.  Neurological:  Negative for dizziness, loss of consciousness and headaches.  Endo/Heme/Allergies:  Negative for environmental allergies.  Psychiatric/Behavioral:  Negative for depression. The patient is not nervous/anxious.  Objective:    Physical Exam Vitals reviewed.  Constitutional:      Appearance: Normal appearance. He is not ill-appearing.  HENT:     Head: Normocephalic and atraumatic.     Nose: Nose normal.  Eyes:     Conjunctiva/sclera: Conjunctivae normal.  Cardiovascular:     Rate and Rhythm: Normal rate.     Pulses: Normal pulses.     Heart sounds: Normal heart sounds. No murmur heard. Pulmonary:     Effort: Pulmonary effort is normal.     Breath sounds: Normal breath sounds. No wheezing.  Abdominal:     Palpations: Abdomen is soft. There is no mass.     Tenderness: There is no abdominal tenderness.  Musculoskeletal:     Cervical back: Normal range of motion.     Right lower leg: No edema.     Left lower leg: No edema.  Skin:    General: Skin is warm and dry.  Neurological:     General: No focal deficit present.     Mental Status: He is alert and oriented to person, place, and time.  Psychiatric:        Mood and Affect: Mood normal.    There were no vitals taken for this visit. Wt Readings from Last 3 Encounters:  01/19/24 213 lb 12.8 oz (97 kg)  01/01/24 215 lb (97.5 kg)  12/20/23 218 lb 12.8 oz (99.2 kg)    Diabetic Foot Exam - Simple   No data filed    Lab Results  Component Value Date   WBC 5.6 04/04/2024   HGB 17.4 (H) 04/04/2024   HCT 52.1 (H) 04/04/2024   PLT 305.0 04/04/2024   GLUCOSE 104 (H) 01/19/2024   CHOL 285 (H) 04/04/2024   TRIG 219.0 (H)  04/04/2024   HDL 50.50 04/04/2024   LDLDIRECT 141.0 01/21/2022   LDLCALC 191 (H) 04/04/2024   ALT 37 01/19/2024   AST 20 01/19/2024   NA 137 01/19/2024   K 4.9 01/19/2024   CL 101 01/19/2024   CREATININE 0.82 01/19/2024   BUN 18 01/19/2024   CO2 27 01/19/2024   TSH 1.64 01/19/2024   PSA 1.98 01/19/2024   INR 1.1 (H) 11/15/2022   HGBA1C 6.0 01/19/2024    Lab Results  Component Value Date   TSH 1.64 01/19/2024   Lab Results  Component Value Date   WBC 5.6 04/04/2024   HGB 17.4 (H) 04/04/2024   HCT 52.1 (H) 04/04/2024   MCV 98.9 04/04/2024   PLT 305.0 04/04/2024   Lab Results  Component Value Date   NA 137 01/19/2024   K 4.9 01/19/2024   CO2 27 01/19/2024   GLUCOSE 104 (H) 01/19/2024   BUN 18 01/19/2024   CREATININE 0.82 01/19/2024   BILITOT 0.5 01/19/2024   ALKPHOS 75 01/19/2024   AST 20 01/19/2024   ALT 37 01/19/2024   PROT 7.2 01/19/2024   ALBUMIN  4.4 01/19/2024   CALCIUM  9.7 01/19/2024   ANIONGAP 9 12/06/2022   GFR 101.02 01/19/2024   Lab Results  Component Value Date   CHOL 285 (H) 04/04/2024   Lab Results  Component Value Date   HDL 50.50 04/04/2024   Lab Results  Component Value Date   LDLCALC 191 (H) 04/04/2024   Lab Results  Component Value Date   TRIG 219.0 (H) 04/04/2024   Lab Results  Component Value Date   CHOLHDL 6 04/04/2024   Lab Results  Component Value Date   HGBA1C 6.0 01/19/2024  Assessment & Plan:  Testosterone  deficiency Assessment & Plan: Supplement and monitor   Hyperlipidemia, unspecified hyperlipidemia type Assessment & Plan: Tolerating statin, encouraged heart healthy diet, avoid trans fats, minimize simple carbs and saturated fats. Increase exercise as tolerated     Assessment and Plan Assessment & Plan      Harlene Horton, MD     [1] Allergies Allergen Reactions   Imitrex [Sumatriptan] Anaphylaxis    Swelling around eyes and shortness of breath   Verapamil Anaphylaxis    Swelling  around eyes and shortness of breath   Crestor  [Rosuvastatin ] Other (See Comments)    Joint pain   Penicillins Other (See Comments)    Unknown reaction as a child. Tolerated Cephalosporin Date: 12/07/22.    "

## 2024-07-20 NOTE — Assessment & Plan Note (Signed)
 Tolerating statin, encouraged heart healthy diet, avoid trans fats, minimize simple carbs and saturated fats. Increase exercise as tolerated

## 2024-07-20 NOTE — Assessment & Plan Note (Signed)
 Supplement and monitor

## 2024-07-22 ENCOUNTER — Ambulatory Visit: Payer: Self-pay | Admitting: Family Medicine

## 2024-07-22 ENCOUNTER — Ambulatory Visit: Admitting: Family Medicine

## 2024-07-22 VITALS — BP 126/88 | HR 80 | Temp 98.0°F | Resp 16 | Ht 68.0 in | Wt 202.6 lb

## 2024-07-22 DIAGNOSIS — R351 Nocturia: Secondary | ICD-10-CM | POA: Diagnosis not present

## 2024-07-22 DIAGNOSIS — E785 Hyperlipidemia, unspecified: Secondary | ICD-10-CM

## 2024-07-22 DIAGNOSIS — E349 Endocrine disorder, unspecified: Secondary | ICD-10-CM

## 2024-07-22 DIAGNOSIS — R739 Hyperglycemia, unspecified: Secondary | ICD-10-CM

## 2024-07-22 LAB — TESTOSTERONE: Testosterone: 518.65 ng/dL (ref 300.00–890.00)

## 2024-07-22 LAB — HEMOGLOBIN A1C: Hgb A1c MFr Bld: 5.5 % (ref 4.6–6.5)

## 2024-07-22 LAB — COMPREHENSIVE METABOLIC PANEL WITH GFR
ALT: 66 U/L — ABNORMAL HIGH (ref 3–53)
AST: 54 U/L — ABNORMAL HIGH (ref 5–37)
Albumin: 4.9 g/dL (ref 3.5–5.2)
Alkaline Phosphatase: 60 U/L (ref 39–117)
BUN: 18 mg/dL (ref 6–23)
CO2: 28 meq/L (ref 19–32)
Calcium: 9.9 mg/dL (ref 8.4–10.5)
Chloride: 100 meq/L (ref 96–112)
Creatinine, Ser: 0.94 mg/dL (ref 0.40–1.50)
GFR: 92.89 mL/min
Glucose, Bld: 88 mg/dL (ref 70–99)
Potassium: 5.2 meq/L — ABNORMAL HIGH (ref 3.5–5.1)
Sodium: 136 meq/L (ref 135–145)
Total Bilirubin: 0.8 mg/dL (ref 0.2–1.2)
Total Protein: 7.9 g/dL (ref 6.0–8.3)

## 2024-07-22 LAB — CBC WITH DIFFERENTIAL/PLATELET
Basophils Absolute: 0.1 K/uL (ref 0.0–0.1)
Basophils Relative: 1.9 % (ref 0.0–3.0)
Eosinophils Absolute: 0.2 K/uL (ref 0.0–0.7)
Eosinophils Relative: 4.4 % (ref 0.0–5.0)
HCT: 56.9 % — ABNORMAL HIGH (ref 39.0–52.0)
Hemoglobin: 19 g/dL (ref 13.0–17.0)
Lymphocytes Relative: 22.2 % (ref 12.0–46.0)
Lymphs Abs: 1.2 K/uL (ref 0.7–4.0)
MCHC: 33.4 g/dL (ref 30.0–36.0)
MCV: 99.5 fl (ref 78.0–100.0)
Monocytes Absolute: 0.6 K/uL (ref 0.1–1.0)
Monocytes Relative: 11.5 % (ref 3.0–12.0)
Neutro Abs: 3.4 K/uL (ref 1.4–7.7)
Neutrophils Relative %: 60 % (ref 43.0–77.0)
Platelets: 335 K/uL (ref 150.0–400.0)
RBC: 5.72 Mil/uL (ref 4.22–5.81)
RDW: 14 % (ref 11.5–15.5)
WBC: 5.6 K/uL (ref 4.0–10.5)

## 2024-07-22 LAB — TSH: TSH: 1.5 u[IU]/mL (ref 0.35–5.50)

## 2024-07-22 NOTE — Patient Instructions (Addendum)
 Skratch electrolyte powder can order at [facility] is the new shingles shot, 2 shots over 2-6 months, confirm coverage with insurance and document, then can return here for shots with nurse appt or at pharmacy   Tetanus is due in 2029 unless injured  Consider annual covid and flu boosters

## 2024-07-22 NOTE — Assessment & Plan Note (Signed)
 Has improved greatly

## 2024-07-23 ENCOUNTER — Ambulatory Visit

## 2024-07-23 DIAGNOSIS — E785 Hyperlipidemia, unspecified: Secondary | ICD-10-CM

## 2024-07-23 LAB — LIPID PANEL
Cholesterol: 235 mg/dL — ABNORMAL HIGH (ref 28–200)
HDL: 62 mg/dL
LDL Cholesterol: 155 mg/dL — ABNORMAL HIGH (ref 10–99)
NonHDL: 172.96
Total CHOL/HDL Ratio: 4
Triglycerides: 89 mg/dL (ref 10.0–149.0)
VLDL: 17.8 mg/dL (ref 0.0–40.0)

## 2024-07-23 NOTE — Addendum Note (Signed)
 Addended by: Dustee Bottenfield C on: 07/23/2024 03:09 PM   Modules accepted: Orders

## 2024-08-05 ENCOUNTER — Encounter: Payer: Self-pay | Admitting: Family Medicine

## 2025-01-21 ENCOUNTER — Encounter: Admitting: Physician Assistant
